# Patient Record
Sex: Female | Born: 1955 | Race: White | Hispanic: No | Marital: Married | Smoking: Current every day smoker
Health system: Southern US, Community
[De-identification: ages and names within clinical notes are randomized; demographics above are authoritative.]

## PROBLEM LIST (undated history)

## (undated) DIAGNOSIS — M26629 Arthralgia of temporomandibular joint, unspecified side: Secondary | ICD-10-CM

## (undated) DIAGNOSIS — F32A Depression, unspecified: Secondary | ICD-10-CM

## (undated) DIAGNOSIS — R519 Headache, unspecified: Secondary | ICD-10-CM

## (undated) DIAGNOSIS — M797 Fibromyalgia: Secondary | ICD-10-CM

## (undated) DIAGNOSIS — O039 Complete or unspecified spontaneous abortion without complication: Secondary | ICD-10-CM

## (undated) DIAGNOSIS — K469 Unspecified abdominal hernia without obstruction or gangrene: Secondary | ICD-10-CM

## (undated) DIAGNOSIS — Z9289 Personal history of other medical treatment: Secondary | ICD-10-CM

## (undated) DIAGNOSIS — E785 Hyperlipidemia, unspecified: Secondary | ICD-10-CM

## (undated) DIAGNOSIS — G629 Polyneuropathy, unspecified: Secondary | ICD-10-CM

## (undated) DIAGNOSIS — Z9889 Other specified postprocedural states: Secondary | ICD-10-CM

## (undated) DIAGNOSIS — G5600 Carpal tunnel syndrome, unspecified upper limb: Secondary | ICD-10-CM

## (undated) DIAGNOSIS — R112 Nausea with vomiting, unspecified: Secondary | ICD-10-CM

## (undated) DIAGNOSIS — G8929 Other chronic pain: Secondary | ICD-10-CM

## (undated) DIAGNOSIS — F329 Major depressive disorder, single episode, unspecified: Secondary | ICD-10-CM

## (undated) DIAGNOSIS — N189 Chronic kidney disease, unspecified: Secondary | ICD-10-CM

## (undated) DIAGNOSIS — T8859XA Other complications of anesthesia, initial encounter: Secondary | ICD-10-CM

## (undated) DIAGNOSIS — M1990 Unspecified osteoarthritis, unspecified site: Secondary | ICD-10-CM

## (undated) DIAGNOSIS — R51 Headache: Secondary | ICD-10-CM

## (undated) DIAGNOSIS — T4145XA Adverse effect of unspecified anesthetic, initial encounter: Secondary | ICD-10-CM

## (undated) DIAGNOSIS — M14671 Charcot's joint, right ankle and foot: Secondary | ICD-10-CM

## (undated) DIAGNOSIS — E1140 Type 2 diabetes mellitus with diabetic neuropathy, unspecified: Secondary | ICD-10-CM

## (undated) DIAGNOSIS — K219 Gastro-esophageal reflux disease without esophagitis: Secondary | ICD-10-CM

## (undated) DIAGNOSIS — I10 Essential (primary) hypertension: Secondary | ICD-10-CM

## (undated) HISTORY — PX: TMJ ARTHROPLASTY: SHX1066

## (undated) HISTORY — PX: BUNIONECTOMY: SHX129

## (undated) HISTORY — DX: Depression, unspecified: F32.A

## (undated) HISTORY — PX: COLONOSCOPY: SHX174

## (undated) HISTORY — DX: Other chronic pain: G89.29

## (undated) HISTORY — PX: ABDOMINAL SURGERY: SHX537

## (undated) HISTORY — PX: TONSILLECTOMY: SUR1361

## (undated) HISTORY — PX: OTHER SURGICAL HISTORY: SHX169

## (undated) HISTORY — DX: Arthralgia of temporomandibular joint, unspecified side: M26.629

## (undated) HISTORY — DX: Gastro-esophageal reflux disease without esophagitis: K21.9

## (undated) HISTORY — DX: Major depressive disorder, single episode, unspecified: F32.9

## (undated) HISTORY — DX: Hyperlipidemia, unspecified: E78.5

## (undated) HISTORY — DX: Complete or unspecified spontaneous abortion without complication: O03.9

## (undated) HISTORY — DX: Carpal tunnel syndrome, unspecified upper limb: G56.00

---

## 1981-11-15 HISTORY — PX: TUBAL LIGATION: SHX77

## 1981-11-15 HISTORY — PX: PARTIAL HYSTERECTOMY: SHX80

## 1998-06-30 ENCOUNTER — Ambulatory Visit (HOSPITAL_COMMUNITY): Admission: RE | Admit: 1998-06-30 | Discharge: 1998-06-30 | Payer: Self-pay | Admitting: Obstetrics and Gynecology

## 1998-06-30 ENCOUNTER — Encounter: Payer: Self-pay | Admitting: Obstetrics and Gynecology

## 1998-07-01 ENCOUNTER — Ambulatory Visit (HOSPITAL_COMMUNITY): Admission: RE | Admit: 1998-07-01 | Discharge: 1998-07-01 | Payer: Self-pay | Admitting: Obstetrics and Gynecology

## 1998-07-01 ENCOUNTER — Encounter: Payer: Self-pay | Admitting: Obstetrics and Gynecology

## 1999-06-17 ENCOUNTER — Ambulatory Visit (HOSPITAL_COMMUNITY): Admission: RE | Admit: 1999-06-17 | Discharge: 1999-06-17 | Payer: Self-pay | Admitting: Obstetrics and Gynecology

## 1999-06-17 ENCOUNTER — Encounter: Payer: Self-pay | Admitting: Obstetrics and Gynecology

## 1999-11-15 ENCOUNTER — Ambulatory Visit (HOSPITAL_COMMUNITY): Admission: RE | Admit: 1999-11-15 | Discharge: 1999-11-15 | Payer: Self-pay | Admitting: Oral Surgery

## 1999-11-15 ENCOUNTER — Encounter: Payer: Self-pay | Admitting: Oral Surgery

## 2000-02-18 ENCOUNTER — Encounter: Payer: Self-pay | Admitting: Oral Surgery

## 2000-02-23 ENCOUNTER — Inpatient Hospital Stay (HOSPITAL_COMMUNITY): Admission: RE | Admit: 2000-02-23 | Discharge: 2000-02-24 | Payer: Self-pay | Admitting: Oral Surgery

## 2000-07-19 ENCOUNTER — Ambulatory Visit (HOSPITAL_COMMUNITY): Admission: RE | Admit: 2000-07-19 | Discharge: 2000-07-19 | Payer: Self-pay | Admitting: Obstetrics and Gynecology

## 2000-07-19 ENCOUNTER — Encounter: Payer: Self-pay | Admitting: Obstetrics and Gynecology

## 2000-11-15 HISTORY — PX: VAGINAL HYSTERECTOMY: SUR661

## 2001-03-28 ENCOUNTER — Ambulatory Visit (HOSPITAL_BASED_OUTPATIENT_CLINIC_OR_DEPARTMENT_OTHER): Admission: RE | Admit: 2001-03-28 | Discharge: 2001-03-28 | Payer: Self-pay | Admitting: Orthopedic Surgery

## 2001-04-17 ENCOUNTER — Encounter: Payer: Self-pay | Admitting: General Surgery

## 2001-04-19 ENCOUNTER — Ambulatory Visit (HOSPITAL_COMMUNITY): Admission: RE | Admit: 2001-04-19 | Discharge: 2001-04-20 | Payer: Self-pay | Admitting: General Surgery

## 2001-05-26 ENCOUNTER — Ambulatory Visit (HOSPITAL_BASED_OUTPATIENT_CLINIC_OR_DEPARTMENT_OTHER): Admission: RE | Admit: 2001-05-26 | Discharge: 2001-05-26 | Payer: Self-pay | Admitting: Orthopedic Surgery

## 2001-09-05 ENCOUNTER — Encounter: Payer: Self-pay | Admitting: Obstetrics and Gynecology

## 2001-09-05 ENCOUNTER — Ambulatory Visit (HOSPITAL_COMMUNITY): Admission: RE | Admit: 2001-09-05 | Discharge: 2001-09-05 | Payer: Self-pay | Admitting: Obstetrics and Gynecology

## 2001-09-28 ENCOUNTER — Encounter: Payer: Self-pay | Admitting: Obstetrics and Gynecology

## 2001-10-04 ENCOUNTER — Inpatient Hospital Stay (HOSPITAL_COMMUNITY): Admission: RE | Admit: 2001-10-04 | Discharge: 2001-10-06 | Payer: Self-pay | Admitting: Obstetrics and Gynecology

## 2002-05-14 ENCOUNTER — Ambulatory Visit (HOSPITAL_COMMUNITY): Admission: RE | Admit: 2002-05-14 | Discharge: 2002-05-14 | Payer: Self-pay | Admitting: Orthopedic Surgery

## 2002-05-14 ENCOUNTER — Encounter: Payer: Self-pay | Admitting: Orthopedic Surgery

## 2002-09-06 ENCOUNTER — Encounter: Payer: Self-pay | Admitting: Obstetrics and Gynecology

## 2002-09-06 ENCOUNTER — Ambulatory Visit (HOSPITAL_COMMUNITY): Admission: RE | Admit: 2002-09-06 | Discharge: 2002-09-06 | Payer: Self-pay | Admitting: Obstetrics and Gynecology

## 2002-11-06 ENCOUNTER — Ambulatory Visit (HOSPITAL_BASED_OUTPATIENT_CLINIC_OR_DEPARTMENT_OTHER): Admission: RE | Admit: 2002-11-06 | Discharge: 2002-11-06 | Payer: Self-pay | Admitting: Orthopedic Surgery

## 2003-11-11 ENCOUNTER — Ambulatory Visit (HOSPITAL_COMMUNITY): Admission: RE | Admit: 2003-11-11 | Discharge: 2003-11-11 | Payer: Self-pay | Admitting: Obstetrics and Gynecology

## 2004-05-08 ENCOUNTER — Encounter: Admission: RE | Admit: 2004-05-08 | Discharge: 2004-05-08 | Payer: Self-pay | Admitting: General Surgery

## 2004-05-14 ENCOUNTER — Encounter: Admission: RE | Admit: 2004-05-14 | Discharge: 2004-05-14 | Payer: Self-pay | Admitting: General Surgery

## 2004-08-14 ENCOUNTER — Ambulatory Visit (HOSPITAL_COMMUNITY): Admission: RE | Admit: 2004-08-14 | Discharge: 2004-08-14 | Payer: Self-pay | Admitting: Gastroenterology

## 2004-10-23 ENCOUNTER — Ambulatory Visit: Payer: Self-pay | Admitting: Cardiology

## 2005-07-30 ENCOUNTER — Encounter: Admission: RE | Admit: 2005-07-30 | Discharge: 2005-07-30 | Payer: Self-pay | Admitting: General Surgery

## 2005-09-21 ENCOUNTER — Inpatient Hospital Stay (HOSPITAL_COMMUNITY): Admission: RE | Admit: 2005-09-21 | Discharge: 2005-09-23 | Payer: Self-pay | Admitting: General Surgery

## 2010-12-06 ENCOUNTER — Encounter: Payer: Self-pay | Admitting: Obstetrics and Gynecology

## 2012-06-20 ENCOUNTER — Emergency Department (HOSPITAL_COMMUNITY): Payer: BC Managed Care – PPO

## 2012-06-20 ENCOUNTER — Encounter (HOSPITAL_COMMUNITY): Payer: Self-pay | Admitting: *Deleted

## 2012-06-20 ENCOUNTER — Emergency Department (HOSPITAL_COMMUNITY)
Admission: EM | Admit: 2012-06-20 | Discharge: 2012-06-21 | Disposition: A | Discharge status: Home / Self Care | Payer: BC Managed Care – PPO | Attending: Emergency Medicine | Admitting: Emergency Medicine

## 2012-06-20 DIAGNOSIS — IMO0001 Reserved for inherently not codable concepts without codable children: Secondary | ICD-10-CM | POA: Insufficient documentation

## 2012-06-20 DIAGNOSIS — S0003XA Contusion of scalp, initial encounter: Secondary | ICD-10-CM | POA: Insufficient documentation

## 2012-06-20 DIAGNOSIS — E119 Type 2 diabetes mellitus without complications: Secondary | ICD-10-CM | POA: Insufficient documentation

## 2012-06-20 DIAGNOSIS — Y92009 Unspecified place in unspecified non-institutional (private) residence as the place of occurrence of the external cause: Secondary | ICD-10-CM | POA: Insufficient documentation

## 2012-06-20 DIAGNOSIS — I10 Essential (primary) hypertension: Secondary | ICD-10-CM | POA: Insufficient documentation

## 2012-06-20 DIAGNOSIS — W19XXXA Unspecified fall, initial encounter: Secondary | ICD-10-CM | POA: Insufficient documentation

## 2012-06-20 DIAGNOSIS — T50905A Adverse effect of unspecified drugs, medicaments and biological substances, initial encounter: Secondary | ICD-10-CM

## 2012-06-20 DIAGNOSIS — E669 Obesity, unspecified: Secondary | ICD-10-CM | POA: Insufficient documentation

## 2012-06-20 DIAGNOSIS — Z79899 Other long term (current) drug therapy: Secondary | ICD-10-CM | POA: Insufficient documentation

## 2012-06-20 DIAGNOSIS — W1800XA Striking against unspecified object with subsequent fall, initial encounter: Secondary | ICD-10-CM

## 2012-06-20 HISTORY — DX: Essential (primary) hypertension: I10

## 2012-06-20 HISTORY — DX: Fibromyalgia: M79.7

## 2012-06-20 HISTORY — DX: Polyneuropathy, unspecified: G62.9

## 2012-06-20 HISTORY — DX: Unspecified abdominal hernia without obstruction or gangrene: K46.9

## 2012-06-20 LAB — POCT I-STAT, CHEM 8
BUN: 18 mg/dL (ref 6–23)
Calcium, Ion: 1.23 mmol/L (ref 1.12–1.23)
Chloride: 101 mEq/L (ref 96–112)
Creatinine, Ser: 0.8 mg/dL (ref 0.50–1.10)
Glucose, Bld: 107 mg/dL — ABNORMAL HIGH (ref 70–99)
HCT: 36 % (ref 36.0–46.0)
Hemoglobin: 12.2 g/dL (ref 12.0–15.0)
Potassium: 4.4 mEq/L (ref 3.5–5.1)
Sodium: 140 mEq/L (ref 135–145)
TCO2: 30 mmol/L (ref 0–100)

## 2012-06-20 LAB — CBC
HCT: 35.2 % — ABNORMAL LOW (ref 36.0–46.0)
Hemoglobin: 11.1 g/dL — ABNORMAL LOW (ref 12.0–15.0)
MCH: 30.9 pg (ref 26.0–34.0)
MCHC: 31.5 g/dL (ref 30.0–36.0)
MCV: 98.1 fL (ref 78.0–100.0)
Platelets: 206 10*3/uL (ref 150–400)
RBC: 3.59 MIL/uL — ABNORMAL LOW (ref 3.87–5.11)
RDW: 12.9 % (ref 11.5–15.5)
WBC: 7.7 10*3/uL (ref 4.0–10.5)

## 2012-06-20 LAB — GLUCOSE, CAPILLARY: Glucose-Capillary: 88 mg/dL (ref 70–99)

## 2012-06-20 MED ORDER — SODIUM CHLORIDE 0.9 % IV SOLN
Freq: Once | INTRAVENOUS | Status: AC
  Administered 2012-06-21: via INTRAVENOUS

## 2012-06-20 NOTE — ED Notes (Signed)
Pt states she ran into a wall and fell early this morning approx 2 am, c/o right facial pain and right knee pain.

## 2012-06-20 NOTE — ED Notes (Signed)
Pt in with family. Pt states she got up in the middle of the night and got disoriented and walked into a wall. Family thinks she must have tripped and hit face into wall. No loc. Pt has extensive bruising and swelling to right and and right cheek area. Pupils PERLA. Pt states some blurred vision when she is able to open her eye. Also some light sensitivity. Pt family also reports that ever since she started taking Lyrica  Pt is very drowsy. Pt is A&ox3

## 2012-06-21 LAB — URINE MICROSCOPIC-ADD ON

## 2012-06-21 LAB — URINALYSIS, ROUTINE W REFLEX MICROSCOPIC
Bilirubin Urine: NEGATIVE
Glucose, UA: NEGATIVE mg/dL
Ketones, ur: NEGATIVE mg/dL
Leukocytes, UA: NEGATIVE
Nitrite: NEGATIVE
Protein, ur: NEGATIVE mg/dL
Specific Gravity, Urine: 1.021 (ref 1.005–1.030)
Urobilinogen, UA: 0.2 mg/dL (ref 0.0–1.0)
pH: 5.5 (ref 5.0–8.0)

## 2012-06-21 LAB — RAPID URINE DRUG SCREEN, HOSP PERFORMED
Amphetamines: NOT DETECTED
Barbiturates: NOT DETECTED
Benzodiazepines: POSITIVE — AB
Cocaine: NOT DETECTED
Opiates: POSITIVE — AB
Tetrahydrocannabinol: NOT DETECTED

## 2012-06-21 MED ORDER — CEPHALEXIN 250 MG PO CAPS
250.0000 mg | ORAL_CAPSULE | Freq: Once | ORAL | Status: AC
  Administered 2012-06-21: 250 mg via ORAL
  Filled 2012-06-21: qty 1

## 2012-06-21 MED ORDER — CEPHALEXIN 250 MG PO CAPS: 250.0000 mg | ORAL_CAPSULE | Freq: Four times a day (QID) | ORAL | Status: AC

## 2012-06-21 NOTE — ED Provider Notes (Signed)
History     CSN: [id number]  Arrival date & time 06/20/12  1933   First MD Initiated Contact with Patient 06/20/12 2217      Chief Complaint  Patient presents with  . Fall  . Eye Injury    (Consider location/radiation/quality/duration/timing/severity/associated sxs/prior treatment) HPI Comments: Obese patient, who is very somnolent, stating, that she uses lots of medications for her chronic pain issues.  Over the last night or early this morning.  She fell in her home.  The patient states she hit her face against a wall without loss of consciousness.  She has significant bruising to the right side of her face.  Under her chin.  She also has an abrasion to the anterior portion of her right shin and what appears to be a cellulitic blush to the anterior medial portion of her left shin  The history is provided by the patient and the spouse.    Past Medical History  Diagnosis Date  . Diabetes mellitus   . Hypertension   . Fibromyalgia   . Peripheral neuropathy   . Hernia     Past Surgical History  Procedure Date  . Tonsillectomy   . Abdominal surgery     History reviewed. No pertinent family history.  History  Substance Use Topics  . Smoking status: Never Smoker   . Smokeless tobacco: Not on file  . Alcohol Use: No    OB History    Grav Para Term Preterm Abortions TAB SAB Ect Mult Living                  Review of Systems  Constitutional: Negative for fever and chills.  HENT: Negative for neck pain and neck stiffness.   Eyes: Negative for photophobia, pain, redness and visual disturbance.  Gastrointestinal: Negative for nausea and vomiting.  Musculoskeletal: Negative for joint swelling.  Skin: Positive for wound.  Neurological: Positive for headaches. Negative for dizziness, weakness and numbness.    Allergies  Review of patient's allergies indicates no known allergies.  Home Medications   Current Outpatient Rx  Name Route Sig Dispense Refill  .  ALPRAZOLAM 0.5 MG PO TABS Oral Take 0.5 mg by mouth 2 (two) times daily.    Marland Kitchen DICLOFENAC-MISOPROSTOL 75-0.2 MG PO TBEC Oral Take 1 tablet by mouth 2 (two) times daily.    . ECONAZOLE NITRATE 1 % EX CREA Topical Apply 1 application topically 2 (two) times daily. Apply to both feet    . METFORMIN HCL ER (OSM) 500 MG PO TB24 Oral Take 500 mg by mouth daily with breakfast.    . MORPHINE SULFATE ER BEADS 120 MG PO CP24 Oral Take 120 mg by mouth 2 (two) times daily.    . NEBIVOLOL HCL 5 MG PO TABS Oral Take 5 mg by mouth daily.    Marland Kitchen RANITIDINE HCL 150 MG PO TABS Oral Take 300 mg by mouth at bedtime.    . SUMATRIPTAN SUCCINATE 100 MG PO TABS Oral Take 100 mg by mouth daily as needed. For migraines not relieved by Topamax    . TOPIRAMATE 100 MG PO TABS Oral Take 100 mg by mouth 2 (two) times daily.    . VENLAFAXINE HCL ER 150 MG PO CP24 Oral Take 150 mg by mouth daily.    . CEPHALEXIN 250 MG PO CAPS Oral Take 1 capsule (250 mg total) by mouth 4 (four) times daily. 28 capsule 0    BP 110/61  Pulse 84  Temp 98.4 F (36.9  C) (Oral)  Resp 18  SpO2 97%  Physical Exam  Constitutional: She appears well-developed and well-nourished.  HENT:  Head: Normocephalic.    Eyes: Pupils are equal, round, and reactive to light.  Neck: Normal range of motion.  Cardiovascular: Normal rate.   Pulmonary/Chest: Effort normal.  Musculoskeletal: Normal range of motion.  Neurological: She is alert.  Skin: Skin is warm.       ED Course  Procedures (including critical care time)  Labs Reviewed  CBC - Abnormal; Notable for the following:    RBC 3.59 (*)     Hemoglobin 11.1 (*)     HCT 35.2 (*)     All other components within normal limits  URINE RAPID DRUG SCREEN (HOSP PERFORMED) - Abnormal; Notable for the following:    Opiates POSITIVE (*)     Benzodiazepines POSITIVE (*)     All other components within normal limits  POCT I-STAT, CHEM 8 - Abnormal; Notable for the following:    Glucose, Bld 107 (*)      All other components within normal limits  URINALYSIS, ROUTINE W REFLEX MICROSCOPIC - Abnormal; Notable for the following:    Hgb urine dipstick MODERATE (*)     All other components within normal limits  GLUCOSE, CAPILLARY  URINE MICROSCOPIC-ADD ON   Ct Head Wo Contrast  06/20/2012  *RADIOLOGY REPORT*  Clinical Data:  The patient walked into a wall.  CT HEAD WITHOUT CONTRAST CT MAXILLOFACIAL WITHOUT CONTRAST CT CERVICAL SPINE WITHOUT CONTRAST  Technique:  Multidetector CT imaging of the head, cervical spine, and maxillofacial structures were performed using the standard protocol without intravenous contrast. Multiplanar CT image reconstructions of the cervical spine and maxillofacial structures were also generated.  Comparison:  No comparison studies available.  CT HEAD  Findings: The ventricles and sulci are symmetrical without significant effacement, displacement, or dilatation. No mass effect or midline shift. No abnormal extra-axial fluid collections. The grey-white matter junction is distinct. Basal cisterns are not effaced. No acute intracranial hemorrhage. No depressed skull fractures.  Mastoid air cells are not opacified.  IMPRESSION: No acute intracranial abnormalities.  CT MAXILLOFACIAL  Findings:  Right periorbital and infraorbital soft tissue hematomas.  No retrobulbar involvement.  The globes and extraocular muscles appear intact and symmetrical.  The orbital rims, maxillary antral walls, nasal bones, nasal septum, nasal spine, pterygoid plates, zygomatic arches, temporomandibular joints, and mandibles appear intact.  No displaced fractures are identified.  The paranasal sinuses are clear.  No acute air-fluid levels noted.  No focal bone lesion or bone destruction.  Small osseous fragment in the left temporomandibular joint likely represents degenerative change.  IMPRESSION: Right periorbital and infraorbital soft tissue hematoma.  No displaced orbital or facial fractures identified.  CT  CERVICAL SPINE  Findings:   Significant technical limitation of the study due to motion artifact.  As visualized, the cervical vertebrae and facet joints appear demonstrate normal alignment.  No vertebral compression deformities.  No prevertebral soft tissue swelling.  No evidence of focal bone lesion or bone destruction.  Lateral masses of C1 appear symmetrical.  The odontoid process appears intact.  IMPRESSION:  Technically limited study due to motion artifact resulting in limited visualization of the cervical vertebrae.  No obvious displaced fractures are identified.  Normal alignment is suggested.  Original Report Authenticated By: Marlon Pel, M.D.   Ct Cervical Spine Wo Contrast  06/20/2012  *RADIOLOGY REPORT*  Clinical Data:  The patient walked into a wall.  CT HEAD WITHOUT CONTRAST  CT MAXILLOFACIAL WITHOUT CONTRAST CT CERVICAL SPINE WITHOUT CONTRAST  Technique:  Multidetector CT imaging of the head, cervical spine, and maxillofacial structures were performed using the standard protocol without intravenous contrast. Multiplanar CT image reconstructions of the cervical spine and maxillofacial structures were also generated.  Comparison:  No comparison studies available.  CT HEAD  Findings: The ventricles and sulci are symmetrical without significant effacement, displacement, or dilatation. No mass effect or midline shift. No abnormal extra-axial fluid collections. The grey-white matter junction is distinct. Basal cisterns are not effaced. No acute intracranial hemorrhage. No depressed skull fractures.  Mastoid air cells are not opacified.  IMPRESSION: No acute intracranial abnormalities.  CT MAXILLOFACIAL  Findings:  Right periorbital and infraorbital soft tissue hematomas.  No retrobulbar involvement.  The globes and extraocular muscles appear intact and symmetrical.  The orbital rims, maxillary antral walls, nasal bones, nasal septum, nasal spine, pterygoid plates, zygomatic arches,  temporomandibular joints, and mandibles appear intact.  No displaced fractures are identified.  The paranasal sinuses are clear.  No acute air-fluid levels noted.  No focal bone lesion or bone destruction.  Small osseous fragment in the left temporomandibular joint likely represents degenerative change.  IMPRESSION: Right periorbital and infraorbital soft tissue hematoma.  No displaced orbital or facial fractures identified.  CT CERVICAL SPINE  Findings:   Significant technical limitation of the study due to motion artifact.  As visualized, the cervical vertebrae and facet joints appear demonstrate normal alignment.  No vertebral compression deformities.  No prevertebral soft tissue swelling.  No evidence of focal bone lesion or bone destruction.  Lateral masses of C1 appear symmetrical.  The odontoid process appears intact.  IMPRESSION:  Technically limited study due to motion artifact resulting in limited visualization of the cervical vertebrae.  No obvious displaced fractures are identified.  Normal alignment is suggested.  Original Report Authenticated By: Marlon Pel, M.D.   Ct Maxillofacial Wo Cm  06/20/2012  *RADIOLOGY REPORT*  Clinical Data:  The patient walked into a wall.  CT HEAD WITHOUT CONTRAST CT MAXILLOFACIAL WITHOUT CONTRAST CT CERVICAL SPINE WITHOUT CONTRAST  Technique:  Multidetector CT imaging of the head, cervical spine, and maxillofacial structures were performed using the standard protocol without intravenous contrast. Multiplanar CT image reconstructions of the cervical spine and maxillofacial structures were also generated.  Comparison:  No comparison studies available.  CT HEAD  Findings: The ventricles and sulci are symmetrical without significant effacement, displacement, or dilatation. No mass effect or midline shift. No abnormal extra-axial fluid collections. The grey-white matter junction is distinct. Basal cisterns are not effaced. No acute intracranial hemorrhage. No  depressed skull fractures.  Mastoid air cells are not opacified.  IMPRESSION: No acute intracranial abnormalities.  CT MAXILLOFACIAL  Findings:  Right periorbital and infraorbital soft tissue hematomas.  No retrobulbar involvement.  The globes and extraocular muscles appear intact and symmetrical.  The orbital rims, maxillary antral walls, nasal bones, nasal septum, nasal spine, pterygoid plates, zygomatic arches, temporomandibular joints, and mandibles appear intact.  No displaced fractures are identified.  The paranasal sinuses are clear.  No acute air-fluid levels noted.  No focal bone lesion or bone destruction.  Small osseous fragment in the left temporomandibular joint likely represents degenerative change.  IMPRESSION: Right periorbital and infraorbital soft tissue hematoma.  No displaced orbital or facial fractures identified.  CT CERVICAL SPINE  Findings:   Significant technical limitation of the study due to motion artifact.  As visualized, the cervical vertebrae and facet joints appear demonstrate normal  alignment.  No vertebral compression deformities.  No prevertebral soft tissue swelling.  No evidence of focal bone lesion or bone destruction.  Lateral masses of C1 appear symmetrical.  The odontoid process appears intact.  IMPRESSION:  Technically limited study due to motion artifact resulting in limited visualization of the cervical vertebrae.  No obvious displaced fractures are identified.  Normal alignment is suggested.  Original Report Authenticated By: Marlon Pel, M.D.     1. Fall against object   2. Medication adverse effect       MDM   I reviewed the CT scans with the patient and her husband as well.  As discussed her medication usage.  I recommended that she stopped several of her medications make an appointment with her primary care physician for referral to a chronic pain clinic        Arman Filter, NP 06/21/12 1610  Arman Filter, NP 06/21/12 [phone]

## 2012-06-22 NOTE — ED Provider Notes (Signed)
Medical screening examination/treatment/procedure(s) were performed by non-physician practitioner and as supervising physician I was immediately available for consultation/collaboration.  Flint Melter, MD 06/22/12 2113

## 2012-08-16 ENCOUNTER — Other Ambulatory Visit: Payer: Self-pay | Admitting: Cardiology

## 2012-08-16 DIAGNOSIS — I739 Peripheral vascular disease, unspecified: Secondary | ICD-10-CM

## 2012-08-21 ENCOUNTER — Encounter (INDEPENDENT_AMBULATORY_CARE_PROVIDER_SITE_OTHER): Payer: BC Managed Care – PPO

## 2012-08-21 DIAGNOSIS — I739 Peripheral vascular disease, unspecified: Secondary | ICD-10-CM

## 2012-08-21 DIAGNOSIS — M79609 Pain in unspecified limb: Secondary | ICD-10-CM

## 2014-10-14 ENCOUNTER — Encounter: Payer: Self-pay | Admitting: *Deleted

## 2014-10-15 ENCOUNTER — Ambulatory Visit: Payer: Medicare Other | Admitting: Neurology

## 2014-10-15 ENCOUNTER — Encounter: Payer: Self-pay | Admitting: Neurology

## 2014-10-15 ENCOUNTER — Telehealth: Payer: Self-pay | Admitting: Neurology

## 2014-10-15 NOTE — Telephone Encounter (Signed)
Pt called to r/s her NP appt w/ Dr. Allena KatzPatel on 10/15/14. Pt r/s to 12/06/14 Dr. Esmond PlantsButler/referring provider was notified.

## 2014-10-17 NOTE — Telephone Encounter (Signed)
See previous documentation. Appt marked as a no show b/c the patient did not provide 24hrs prior notification but a no show letter will not be sent to the patient / Sherri S.

## 2014-12-06 ENCOUNTER — Ambulatory Visit: Payer: Medicare Other | Admitting: Neurology

## 2014-12-10 ENCOUNTER — Encounter: Payer: Self-pay | Admitting: Neurology

## 2014-12-10 ENCOUNTER — Ambulatory Visit (INDEPENDENT_AMBULATORY_CARE_PROVIDER_SITE_OTHER): Payer: BLUE CROSS/BLUE SHIELD | Admitting: Neurology

## 2014-12-10 ENCOUNTER — Ambulatory Visit: Payer: Medicare Other | Admitting: Neurology

## 2014-12-10 ENCOUNTER — Telehealth: Payer: Self-pay | Admitting: *Deleted

## 2014-12-10 ENCOUNTER — Encounter: Payer: Self-pay | Admitting: *Deleted

## 2014-12-10 VITALS — BP 160/90 | HR 80 | Ht 63.0 in | Wt 240.6 lb

## 2014-12-10 DIAGNOSIS — IMO0001 Reserved for inherently not codable concepts without codable children: Secondary | ICD-10-CM

## 2014-12-10 DIAGNOSIS — E1140 Type 2 diabetes mellitus with diabetic neuropathy, unspecified: Secondary | ICD-10-CM | POA: Insufficient documentation

## 2014-12-10 DIAGNOSIS — Z79899 Other long term (current) drug therapy: Secondary | ICD-10-CM

## 2014-12-10 DIAGNOSIS — R030 Elevated blood-pressure reading, without diagnosis of hypertension: Secondary | ICD-10-CM

## 2014-12-10 DIAGNOSIS — E0842 Diabetes mellitus due to underlying condition with diabetic polyneuropathy: Secondary | ICD-10-CM

## 2014-12-10 LAB — TSH: TSH: 7.684 u[IU]/mL — AB (ref 0.350–4.500)

## 2014-12-10 LAB — VITAMIN B12: Vitamin B-12: 348 pg/mL (ref 211–911)

## 2014-12-10 LAB — SEDIMENTATION RATE: Sed Rate: 45 mm/hr — ABNORMAL HIGH (ref 0–22)

## 2014-12-10 MED ORDER — LIDOCAINE 5 % EX OINT: TOPICAL_OINTMENT | CUTANEOUS | Status: DC

## 2014-12-10 NOTE — Telephone Encounter (Signed)
Second BP check 160/90.  BP put into extended vitals.

## 2014-12-10 NOTE — Progress Notes (Signed)
[facility] Clinic Note - Initial Visit   Date: 12/10/2014:   Erin [name] MRN: [id number] DOB: October 02, 1956   Dear Dr. Melina Copa:  Thank you for your kind referral of Erin [name] for consultation of numbness/tingling of the feet. Although her history is well known to you, please allow [place] to reiterate it for the purpose of our medical record. The patient was accompanied to the clinic by self.    History of Present Illness: Erin [name] is a 59 y.o. right-handed Caucasian female with GERD, fibromyalgia, hypertension, depression, migraine, diabetes mellitus type 2, vitamin B12 deficiency, chronic pain syndrome presenting for evaluation of numbness/tingling of the feet.  Starting around 2013, she developed sudden onset of numbness/tingling involving her right foot and within three months, she developed similar symptoms on the right.  She also complains of burning sensation and intermittent stabbing pain over the arch of the feet.  Symptoms are constant and worse at night and light pressure.  Nothing that alleviates her pain.  Discomfort is now at the level of mid-calf and involves her ankles and feet.  She was started on Lyrica initially and is currently on $RemoveBefo'100mg'cdbKmnZVTiz$  TID which seems to alleviate some of the pain.   She takes oxycodone $RemoveBeforeD'30mg'wgHKDLWofmzkhF$  every 4-5 hours.  In the fall of 2015, she developed intermittent numbness/tingling of the hands which is worse in the morning.    She walks with a cane for balance.  No recent falls.    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    She also seen podiatry and a chiropractor.  She was told her spine was totally out of alignment and received laser therapy to her feet for three weeks, but felt even worse so stopped going.   She has tried heating pads, ice, gabapentin (cogntive side effects) and Lyrica $RemoveBe'100mg'jSfFvUCkf$  TID.  Out-side paper records, electronic medical record, and images have been reviewed where  available and summarized as:  HbA1c 06/18/2013:  5.5  Past Medical History  Diagnosis Date  . Diabetes mellitus   . Hypertension   . Fibromyalgia   . Peripheral neuropathy   . Hernia   . Depression   . GERD (gastroesophageal reflux disease)   . Chronic pain   . Miscarriage     twins  . Carpal tunnel syndrome   . TMJ syndrome     Past Surgical History  Procedure Laterality Date  . Tonsillectomy    . Abdominal surgery       Medications:  Current Outpatient Prescriptions on File Prior to Visit  Medication Sig Dispense Refill  . metformin (FORTAMET) 500 MG (OSM) 24 hr tablet Take 500 mg by mouth daily with breakfast.     No current facility-administered medications on file prior to visit.    Allergies: No Known Allergies  Family History: Family History  Problem Relation Age of Onset  . Aneurysm Father     Deceased, 62  . CAD Father   . Heart disease Father   . Cancer Father   . Pancreatic cancer Mother     Deceased, 66    Social History: History   Social History  . Marital Status: Married    Spouse Name: N/A    Number of Children: N/A  . Years of Education: N/A   Occupational History  . Not on file.   Social History Main Topics  . Smoking status: Former Research scientist (life sciences)  . Smokeless tobacco: Not on file  . Alcohol Use:  No  . Drug Use: No  . Sexual Activity: Not on file   Other Topics Concern  . Not on file   Social History Narrative   Lives with husband in a 2 story home.  Has 3 grown daughters and 3 granddaughters.  On disability for many different reasons.    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      Review of Systems:  CONSTITUTIONAL: No fevers, chills, night sweats, or weight loss.  + weight gain EYES: No visual changes or eye pain ENT: No hearing changes.  No history of nose bleeds.   RESPIRATORY: No cough, wheezing and shortness of breath.   CARDIOVASCULAR: Negative for chest  pain, and palpitations.   GI: Negative for abdominal discomfort, blood in stools or black stools.  No recent change in bowel habits.   GU:  No history of incontinence.   MUSCLOSKELETAL: No history of joint pain or swelling.  No myalgias.   SKIN: Negative for lesions, rash, and itching.   HEMATOLOGY/ONCOLOGY: Negative for prolonged bleeding, bruising easily, and swollen nodes.  No history of cancer.   ENDOCRINE: Negative for cold or heat intolerance, polydipsia or goiter.   PSYCH:  No depression or anxiety symptoms.   NEURO: As Above.   Vital Signs:  BP 160/90 mmHg  Pulse 80  Ht _0  (1.6 m)  Wt 240 lb 9 oz (109.118 kg)  BMI 42.62 kg/m2  SpO2 94%   General Medical Exam:   General:  Obese, well appearing, comfortable.   Eyes/ENT: see cranial nerve examination.   Neck: No masses appreciated.  Full range of motion without tenderness.  No carotid bruits. Respiratory:  Clear to auscultation, good air entry bilaterally.   Cardiac:  Regular rate and rhythm, no murmur.   Extremities:  No deformities, edema, or skin discoloration.  Skin:  No rashes or lesions.  Neurological Exam: MENTAL STATUS including orientation to time, place, person, recent and remote memory, attention span and concentration, language, and fund of knowledge is normal.  Speech is not dysarthric.  CRANIAL NERVES: II:  No visual field defects.  Unremarkable fundi.   III-IV-VI: Pupils equal round and reactive to light.  Normal conjugate, extra-ocular eye movements in all directions of gaze.  No nystagmus.  Left ptosis (old).   V:  Normal facial sensation.  VII:  Left facial asymmetry with left ptosis (old) and nasolabial flattening (previous TMJ surgery x 2).  No pathologic facial reflexes.  VIII:  Normal hearing and vestibular function.   IX-X:  Normal palatal movement.   XI:  Normal shoulder shrug and head rotation.   XII:  Normal tongue strength and range of motion, no deviation or fasciculation.  MOTOR:  No  atrophy, fasciculations or abnormal movements.  No pronator drift.  Tone is normal.    Right Upper Extremity:    Left Upper Extremity:    Deltoid  5/5   Deltoid  5/5   Biceps  5/5   Biceps  5/5   Triceps  5/5   Triceps  5/5   Wrist extensors  5/5   Wrist extensors  5/5   Wrist flexors  5/5   Wrist flexors  5/5   Finger extensors  5/5   Finger extensors  5/5   Finger flexors  5/5   Finger flexors  5/5   Dorsal interossei  5/5   Dorsal interossei  5/5   Abductor pollicis  5/5  Abductor pollicis  5/5   Tone (Ashworth scale)  0  Tone (Ashworth scale)  0   Right Lower Extremity:    Left Lower Extremity:    Hip flexors  5/5   Hip flexors  5/5   Hip extensors  5/5   Hip extensors  5/5   Knee flexors  5/5   Knee flexors  5/5   Knee extensors  5/5   Knee extensors  5/5   Dorsiflexors  5/5   Dorsiflexors  5/5   Plantarflexors  5/5   Plantarflexors  5/5   Toe extensors  5/5   Toe extensors  5/5   Toe flexors  5/5   Toe flexors  5/5   Tone (Ashworth scale)  0  Tone (Ashworth scale)  0   MSRs:  Right                                                                 Left brachioradialis 2+  brachioradialis 2+  biceps 2+  biceps 2+  triceps 2+  triceps 2+  patellar 2+  patellar 2+  ankle jerk 0  ankle jerk 0  Hoffman no  Hoffman no  plantar response down  plantar response down   SENSORY:  Pin prick and temperature reduced distal to mid-calf bilaterally.  Vibration absent distal to ankles bilaterally.  Sensation all modalities intact in the arms.  Romberg's sign positive.   COORDINATION/GAIT: Normal finger-to- nose-finger. Intact rapid alternating movements bilaterally.  Able to rise from a chair without using arms.  Gait is wide-based due to body habitus, stable.  She is unsteady with tandem gait.    IMPRESSION: Erin [name] is a 59 year-old female presenting for evaluation of neuropathic pain affecting the feet. Her neurological examination shows a distal predominant peripheral  neuropathy. I had extensive discussion with the patient regarding the pathogenesis, etiology, management, and natural course of neuropathy. Neuropathy tends to be slowly progressive, especially underlying etiology is not adequately managed.  Although she has diabetes, her HbA1c from 2014 was 5.5 so it is difficult to attribute the severity of her symptoms to diabetes neuropathy.  For this reason, I would like to test for other treatable causes of neuropathy. From a symptomatic standpoint, pain is her biggest complaint.  She has only been on neurontin which she did not tolerate due to cognitive side effects and there is still room to go up on her Lyrica.  Going forward, consider adding amitriptyline, Cymbalta, venlafaxine, or alpha-lipoic acid.  I stressed that the goal is to reduce the amount of oxycodone she is taking.   PLAN/RECOMMENDATIONS:  1.  Increase Lyrica to 179m in the morning, 1049mafternoon, and 20015mt bedtime 2.  Start lidocaine ointment to feet twice daily 3.  Check ESR, vitamin B12, TSH, vitamin B6, SPEP/UPEP with IFE, copper, HbA1c 4.  Encouraged to stay active for weight loss 5.  Fall precautions and walking with a cane encouraged 6.  Consider gait training, if symptoms worsen 7.  Return to clinic in 3 months.   The duration of this appointment visit was 50 minutes of face-to-face time with the patient.  Greater than 50% of this time was spent in counseling, explanation of diagnosis, planning of further management, and coordination of care.  Thank you for allowing me to participate in patient's care.  If I can answer any additional questions, I would be pleased to do so.    Sincerely,    Rory Xiang K. Posey Pronto, DO

## 2014-12-10 NOTE — Patient Instructions (Addendum)
1.  Increase Lyrica to  in the morning,  afternoon, and  at bedtime. Try to cut back on the amount of oxycodone you are taking 2.  Start lidocaine ointment to feet twice daily.  Please be sure to use gloves when applying 3.  Check blood work 4.  Encouraged to stay active for weight loss 5.  Fall precautions and walking with a cane encouraged 6.  Call with an update in 69-month 7.  Monitor blood pressure at home and share with your PCP if it remains elevated 8.  Return to clinic in 3 months.   [facility]  Preventing Falls in the Home   Falls are common, often dreaded events in the lives of older people. Aside from the obvious injuries and even death that may result, falls can cause wide-ranging consequences including loss of independence, mental decline, decreased activity, and mobility. Younger people are also at risk of falling, especially those with chronic illnesses and fatigue.  Ways to reduce the risk for falling:  * Examine diet and medications. Warm foods and alcohol dilate blood vessels, which can lead to dizziness when standing. Sleep aids, antidepressants, and pain medications can also increase the likelihood of a fall.  * Get a vison exam. Poor vision, cataracts, and glaucoma increase the chances of falling.  * Check foot gear. Shoes should fit snugly and have a sturdy, nonskid sole and broad, low heel.  * Participate in a physician-approved exercise program to build and maintain muscle strength and improve balance and coordination.  * Increase vitamin D intake. Vitamin D improves muscle strength and increases the amount of calcium the body is able to absorb and deposit in bones.  How to prevent falls from common hazards:  * Floors - Remove all loose wires, cords, and throw rugs. Minimize clutter. Make sure rugs are anchored and smooth. Keep furniture in its usual place.  * Chairs - Use chairs with straight backs, armrests, and firm seats. Add firm cushions to  existing pieces to add height.  * Bathroom - Install grab bars and non-skid tape in the tub or shower. Use a bathtub transfer bench or a shower chair with a back support. Use an elevated toilet seat and/or safety rails to assist standing from a low surface. Do not use towel racks or bathroom tissue holders to help you stand.  * Lighting - Make sure halls, stairways, and entrances are well-lit. Install a night light in your bathroom or hallway. Make sure there is a light switch at the top and bottom of the staircase. Turn lights on if you get up in the middle of the night. Make sure lamps or light switches are within reach of the bed if you have to get up during the night.  * Kitchen - Install non-skid rubber mats near the sink and stove. Clean spills immediately. Store frequently used utensils, pots, and pans between waist and eye level. This helps prevent reaching and bending. Sit when getting things out of the lower cupboards.  * Living room / Bedrooms - Place furniture with wide spaces in between, giving enough room to move around. Establish a route through the living room that gives you something to hold onto as you walk.  * Stairs - Make sure treads, rails, and rugs are secure. Install a rail on both sides of the stairs. If stairs are a threat, it might be helpful to arrange most of your activities on the lower level to reduce the number of times you  must climb the stairs.  * Entrances and doorways - Install metal handles on the walls adjacent to the doorknobs of all doors to make it more secure as you travel through the doorway.  Tips for maintaining balance:  * Keep at least one hand free at all times Try using a backpack or fanny pack to hold things rather than carrying them in your hands. Never carry objects in both hands when walking as this interferes with keeping your balance.  * Attempt to swing both arms from front to back while walking. This might require a conscious effort if Parkinson's  disease has diminished your movement. It will, however, help you to maintain balance and posture, and reduce fatigue.  * Consciously lift your feet off the ground when walking. Shuffling and dragging of the feet is a common culprit in losing your balance.  * When trying to navigate turns, use a "U" technique of facing forward and making a wide turn, rather than pivoting sharply.  * Try to stand with your feet shoulder-length apart. When your feet are close together for any length of time, you increase your risk of losing your balance and falling.  * Do one thing at a time. Do not try to walk and accomplish another task, such as reading or looking around. The decrease in your automatic reflexes complicates motor function, so the less distraction, the better.  * Do not wear rubber or gripping soled shoes, they might "catch" on the floor and cause tripping.  * Move slowly when changing positions. Use deliberate, concentrated movements and, if needed, use a grab bar or walking aid. Count fifteen (15) seconds after standing to begin walking.  * If balance is a continuous problem, you might want to consider a walking aid such as a cane, walking stick, or walker. Once you have mastered walking with help, you may be ready to try it again on your own.  This information is provided by [facility] and is not intended to replace the medical advice of your physician or other health care providers. Please consult your physician or other health care providers for advice regarding your specific medical condition.

## 2014-12-10 NOTE — Progress Notes (Signed)
Note faxed.

## 2014-12-11 LAB — HEMOGLOBIN A1C
Hgb A1c MFr Bld: 5.6 %
Mean Plasma Glucose: 114 mg/dL

## 2014-12-12 LAB — COPPER, SERUM: COPPER: 131 ug/dL (ref 70–175)

## 2014-12-12 LAB — SPEP & IFE WITH QIG
ALBUMIN ELP: 56.2 % (ref 55.8–66.1)
ALPHA-1-GLOBULIN: 4.6 % (ref 2.9–4.9)
ALPHA-2-GLOBULIN: 11.2 % (ref 7.1–11.8)
BETA 2: 5.5 % (ref 3.2–6.5)
BETA GLOBULIN: 7.6 % — AB (ref 4.7–7.2)
GAMMA GLOBULIN: 14.9 % (ref 11.1–18.8)
IGM, SERUM: 68 mg/dL (ref 52–322)
IgA: 143 mg/dL (ref 69–380)
IgG (Immunoglobin G), Serum: 1060 mg/dL (ref 690–1700)
TOTAL PROTEIN, SERUM ELECTROPHOR: 7 g/dL (ref 6.0–8.3)

## 2014-12-12 LAB — UIFE/LIGHT CHAINS/TP QN, 24-HR UR
ALBUMIN, U: DETECTED
Total Protein, Urine: <4 mg/dL — ABNORMAL LOW (ref 5–24)

## 2014-12-14 LAB — VITAMIN B6: Vitamin B6: 6.5 ng/mL (ref 2.1–21.7)

## 2014-12-16 ENCOUNTER — Other Ambulatory Visit: Payer: Self-pay | Admitting: *Deleted

## 2014-12-16 DIAGNOSIS — R7989 Other specified abnormal findings of blood chemistry: Secondary | ICD-10-CM

## 2014-12-28 LAB — T4, FREE: Free T4: 0.88 ng/dL (ref 0.80–1.80)

## 2014-12-28 LAB — T3, FREE: T3, Free: 2.4 pg/mL (ref 2.3–4.2)

## 2015-01-17 ENCOUNTER — Telehealth: Payer: Self-pay | Admitting: Neurology

## 2015-01-17 ENCOUNTER — Other Ambulatory Visit: Payer: Self-pay | Admitting: *Deleted

## 2015-01-17 MED ORDER — PREGABALIN 200 MG PO CAPS: 200.0000 mg | ORAL_CAPSULE | Freq: Three times a day (TID) | ORAL | Status: DC

## 2015-01-17 NOTE — Telephone Encounter (Signed)
FYI

## 2015-01-17 NOTE — Telephone Encounter (Signed)
Patient has been given instructions and Rx sent to the pharmacy.

## 2015-01-17 NOTE — Telephone Encounter (Signed)
Pt states that she was to call back in about 4 weeks to let us know how she is doing.. she is doing about the same. She also needs us to call in a refill on the Lyrica she is taking it 4x aday to the drug store [place] that number is [phone] and pt phone number is [phone]

## 2015-01-17 NOTE — Telephone Encounter (Signed)
Please tell her to increase Lyrica to 200mg  in the morning, 100mg  in the afternoon, and 200mg  qhs x 1 week, then increase to 200mg  three times daily.  If she develops lightheadedness, go back to lower dose.  OK to send refill for Lyrica 200mg  three times daily, #90, 3 refills.  Donika K. Allena KatzPatel, DO

## 2015-03-11 ENCOUNTER — Encounter: Payer: Self-pay | Admitting: Neurology

## 2015-03-11 ENCOUNTER — Ambulatory Visit (INDEPENDENT_AMBULATORY_CARE_PROVIDER_SITE_OTHER): Payer: BLUE CROSS/BLUE SHIELD | Admitting: Neurology

## 2015-03-11 VITALS — BP 110/72 | HR 80 | Ht 63.0 in | Wt 253.1 lb

## 2015-03-11 DIAGNOSIS — E1140 Type 2 diabetes mellitus with diabetic neuropathy, unspecified: Secondary | ICD-10-CM

## 2015-03-11 DIAGNOSIS — E0842 Diabetes mellitus due to underlying condition with diabetic polyneuropathy: Secondary | ICD-10-CM

## 2015-03-11 MED ORDER — PREGABALIN 200 MG PO CAPS: 200.0000 mg | ORAL_CAPSULE | Freq: Three times a day (TID) | ORAL | Status: DC

## 2015-03-11 NOTE — Progress Notes (Signed)
Follow-up Visit   Date: 03/11/2015    ADELIN VENTRELLA MRN: [id number] DOB: Nov 28, 1955   Interim History: WARREN KUGELMAN is a 59 y.o. right-handed Caucasian female with GERD, fibromyalgia, hypertension, depression, migraine, diabetes mellitus type 2, vitamin B12 deficiency, chronic pain syndrome  returning to the clinic for follow-up of diabetic neuropathy.  The patient was accompanied to the clinic by self.  History of present illness: Starting around 2013, she developed sudden onset of numbness/tingling involving her right foot and within three months, she developed similar symptoms on the right. She also complains of burning sensation and intermittent stabbing pain over the arch of the feet. Symptoms are constant and worse at night and light pressure. Nothing that alleviates her pain. Discomfort is now at the level of mid-calf and involves her ankles and feet. She was started on Lyrica initially and is currently on 152m TID which seems to alleviate some of the pain. She takes oxycodone 337mevery 4-5 hours. In the fall of 2015, she developed intermittent numbness/tingling of the hands which is worse in the morning.   She walks with a cane for balance. No recent falls.   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     She also seen podiatry and a chiropractor. She was told her spine was totally out of alignment and received laser therapy to her feet for three weeks, but felt even worse so stopped going.   UPDATE 03/11/2015:  Since increasing Lyrica to 20034mhree times daily, she has noticed improvement of her pain, because she no longer has stabbing pain.  She was able to reduce her oxycodone to 72m20m4 times per day, whereas previously she was taking 4-5.  She continues to have spells of hot/cold sensation, but is overall pleased with her improvement.  Neuropathy labs were reviewed and returned normal.  Medications:  Current Outpatient  Prescriptions on File Prior to Visit  Medication Sig Dispense Refill  . benazepril-hydrochlorthiazide (LOTENSIN HCT) 20-25 MG per tablet Take 1 tablet by mouth daily.    . liMarland Kitchenocaine (XYLOCAINE) 5 % ointment Apply to feet twice daily as needed.  Use gloves. 35.44 g 3  . metformin (FORTAMET) 500 MG (OSM) 24 hr tablet Take 500 mg by mouth daily with breakfast.    . omeprazole (PRILOSEC) 40 MG capsule Take 40 mg by mouth daily.    . oxMarland Kitchencodone (ROXICODONE) 30 MG immediate release tablet Take 30 mg by mouth every 4 (four) hours as needed for pain.    . pregabalin (LYRICA) 200 MG capsule Take 1 capsule (200 mg total) by mouth 3 (three) times daily. Before starting the 200 mg tid, we would like for the patient to start taking 200 mg qam, 100 mg in the afternoon and 200 mg qhs. 90 capsule 3   No current facility-administered medications on file prior to visit.    Allergies: No Known Allergies  Review of Systems:  CONSTITUTIONAL: No fevers, chills, night sweats, or weight loss.  EYES: No visual changes or eye pain ENT: No hearing changes.  No history of nose bleeds.   RESPIRATORY: No cough, wheezing and shortness of breath.   CARDIOVASCULAR: Negative for chest pain, and palpitations.   GI: Negative for abdominal discomfort, blood in stools or black stools.  No recent change in bowel habits.   GU:  No history of incontinence.   MUSCLOSKELETAL: No history of joint pain or swelling.  No myalgias.   SKIN: Negative for lesions, rash,  and itching.   ENDOCRINE: Negative for cold or heat intolerance, polydipsia or goiter.   PSYCH:  No depression or anxiety symptoms.   NEURO: As Above.   Vital Signs:  BP 110/72 mmHg  Pulse 80  Ht '5\' 3"'  (1.6 m)  Wt 253 lb 2 oz (114.817 kg)  BMI 44.85 kg/m2  SpO2 95%  Neurological Exam: MENTAL STATUS including orientation to time, place, person, recent and remote memory, attention span and concentration, language, and fund of knowledge is normal.  Speech is not  dysarthric.  CRANIAL NERVES:  Pupils equal round and reactive to light.  Normal conjugate, extra-ocular eye movements in all directions of gaze.  No ptosis.  Face is symmetric.   MOTOR:  Motor strength is 5/5 in all extremities.    SENSORY: Vibration absent distal to ankles bilaterally.  COORDINATION/GAIT:  Gait is slightly wide-based due to body habitus  Data: Labs 12/16/2014:  ESR 45, vitamin B12 348, vitamin B6 6.5, copper 131, TSH 7.6, fT3 2.4, fT4 0.88, HbA1c 5.6  IMPRESSION: Mrs. Bunting is a 59 year-old female returning for evaluation of neuropathic pain affecting the feet. Her neurological examination shows a distal predominant peripheral neuropathy. I had extensive discussion with the patient regarding the pathogenesis, etiology, management, and natural course of neuropathy. Neuropathy tends to be slowly progressive, especially underlying etiology is not adequately managed. Although she has diabetes, her HbA1c from 2014 was 5.5 so it is difficult to attribute the severity of her symptoms to diabetes neuropathy.   From a symptomatic standpoint, her pain seems to be well controlled on Lyrica 278m three times daily.  Going forward, consider adding amitriptyline, Cymbalta, venlafaxine, or alpha-lipoic acid. I stressed that the goal is to reduce the amount of oxycodone, which she is slowly doing.   PLAN/RECOMMENDATIONS:  Continue Lyrica 2061mthree times daily Continue to taper oxycodone as able You can try taking vitamin B12 100031mdaily and see if that helps Return to clinic in 6 months   The duration of this appointment visit was 25 minutes of face-to-face time with the patient.  Greater than 50% of this time was spent in counseling, explanation of diagnosis, planning of further management, and coordination of care.   Thank you for allowing me to participate in patient's care.  If I can answer any additional questions, I would be pleased to do so.    Sincerely,    Darrin Koman  K. PatPosey ProntoO

## 2015-03-11 NOTE — Patient Instructions (Addendum)
Continue Lyrica 200mg  three times daily You can try taking vitamin B12 1000mcg daily and see if that helps Return to clinic in 6 months

## 2015-06-12 ENCOUNTER — Telehealth: Payer: Self-pay | Admitting: Neurology

## 2015-06-12 NOTE — Telephone Encounter (Signed)
Please have her reduce lyrica to  twice daily and see how she does.  We may need to add nortriptyline going forward.  Dalissa Lovin K. Allena Katz, DO

## 2015-06-12 NOTE — Telephone Encounter (Signed)
Pt went to see her family Dr. Samuel Jester recently for a 3 month checkup/ the Lyrica med  x3 qd is causing her to eat more and has gained at least 16 lbs in 3 months/ pt call back# [phone]

## 2015-06-12 NOTE — Telephone Encounter (Signed)
Please advise 

## 2015-06-12 NOTE — Telephone Encounter (Signed)
Called patient back.  No answer.  Left message for her to call me back.

## 2015-06-13 ENCOUNTER — Telehealth: Payer: Self-pay | Admitting: *Deleted

## 2015-06-13 NOTE — Telephone Encounter (Signed)
Patient given instructions

## 2015-06-13 NOTE — Telephone Encounter (Signed)
Patient returning you call in reference to her Lyrica Call back number [phone]

## 2015-06-13 NOTE — Telephone Encounter (Signed)
See next note

## 2015-07-04 ENCOUNTER — Encounter (HOSPITAL_COMMUNITY): Payer: Self-pay | Admitting: Vascular Surgery

## 2015-07-04 ENCOUNTER — Observation Stay (HOSPITAL_COMMUNITY)
Admission: EM | Admit: 2015-07-04 | Discharge: 2015-07-07 | Disposition: A | Discharge status: Home / Self Care | Payer: BLUE CROSS/BLUE SHIELD | Attending: Internal Medicine | Admitting: Internal Medicine

## 2015-07-04 ENCOUNTER — Emergency Department (HOSPITAL_COMMUNITY): Payer: BLUE CROSS/BLUE SHIELD

## 2015-07-04 DIAGNOSIS — R0600 Dyspnea, unspecified: Secondary | ICD-10-CM | POA: Insufficient documentation

## 2015-07-04 DIAGNOSIS — M797 Fibromyalgia: Secondary | ICD-10-CM | POA: Diagnosis not present

## 2015-07-04 DIAGNOSIS — L98499 Non-pressure chronic ulcer of skin of other sites with unspecified severity: Secondary | ICD-10-CM

## 2015-07-04 DIAGNOSIS — G8929 Other chronic pain: Secondary | ICD-10-CM | POA: Insufficient documentation

## 2015-07-04 DIAGNOSIS — R0683 Snoring: Secondary | ICD-10-CM | POA: Diagnosis not present

## 2015-07-04 DIAGNOSIS — D649 Anemia, unspecified: Secondary | ICD-10-CM | POA: Diagnosis not present

## 2015-07-04 DIAGNOSIS — I70209 Unspecified atherosclerosis of native arteries of extremities, unspecified extremity: Secondary | ICD-10-CM

## 2015-07-04 DIAGNOSIS — M79671 Pain in right foot: Secondary | ICD-10-CM | POA: Insufficient documentation

## 2015-07-04 DIAGNOSIS — E02 Subclinical iodine-deficiency hypothyroidism: Secondary | ICD-10-CM | POA: Insufficient documentation

## 2015-07-04 DIAGNOSIS — E119 Type 2 diabetes mellitus without complications: Secondary | ICD-10-CM

## 2015-07-04 DIAGNOSIS — I10 Essential (primary) hypertension: Secondary | ICD-10-CM | POA: Diagnosis not present

## 2015-07-04 DIAGNOSIS — L97519 Non-pressure chronic ulcer of other part of right foot with unspecified severity: Secondary | ICD-10-CM | POA: Diagnosis not present

## 2015-07-04 DIAGNOSIS — Z89411 Acquired absence of right great toe: Secondary | ICD-10-CM | POA: Insufficient documentation

## 2015-07-04 DIAGNOSIS — R0601 Orthopnea: Secondary | ICD-10-CM | POA: Insufficient documentation

## 2015-07-04 DIAGNOSIS — Z800 Family history of malignant neoplasm of digestive organs: Secondary | ICD-10-CM | POA: Insufficient documentation

## 2015-07-04 DIAGNOSIS — E0842 Diabetes mellitus due to underlying condition with diabetic polyneuropathy: Secondary | ICD-10-CM

## 2015-07-04 DIAGNOSIS — M14671 Charcot's joint, right ankle and foot: Secondary | ICD-10-CM | POA: Diagnosis not present

## 2015-07-04 DIAGNOSIS — Z8249 Family history of ischemic heart disease and other diseases of the circulatory system: Secondary | ICD-10-CM | POA: Diagnosis not present

## 2015-07-04 DIAGNOSIS — Z87891 Personal history of nicotine dependence: Secondary | ICD-10-CM | POA: Diagnosis not present

## 2015-07-04 DIAGNOSIS — Z9889 Other specified postprocedural states: Secondary | ICD-10-CM | POA: Diagnosis not present

## 2015-07-04 DIAGNOSIS — G4733 Obstructive sleep apnea (adult) (pediatric): Secondary | ICD-10-CM | POA: Insufficient documentation

## 2015-07-04 DIAGNOSIS — M2041 Other hammer toe(s) (acquired), right foot: Secondary | ICD-10-CM | POA: Diagnosis not present

## 2015-07-04 DIAGNOSIS — R0602 Shortness of breath: Secondary | ICD-10-CM

## 2015-07-04 DIAGNOSIS — L97509 Non-pressure chronic ulcer of other part of unspecified foot with unspecified severity: Secondary | ICD-10-CM

## 2015-07-04 DIAGNOSIS — K219 Gastro-esophageal reflux disease without esophagitis: Secondary | ICD-10-CM | POA: Diagnosis not present

## 2015-07-04 DIAGNOSIS — E662 Morbid (severe) obesity with alveolar hypoventilation: Secondary | ICD-10-CM | POA: Diagnosis not present

## 2015-07-04 DIAGNOSIS — M869 Osteomyelitis, unspecified: Secondary | ICD-10-CM | POA: Diagnosis not present

## 2015-07-04 DIAGNOSIS — I959 Hypotension, unspecified: Secondary | ICD-10-CM | POA: Insufficient documentation

## 2015-07-04 DIAGNOSIS — E11621 Type 2 diabetes mellitus with foot ulcer: Secondary | ICD-10-CM | POA: Diagnosis not present

## 2015-07-04 DIAGNOSIS — E1142 Type 2 diabetes mellitus with diabetic polyneuropathy: Principal | ICD-10-CM | POA: Insufficient documentation

## 2015-07-04 DIAGNOSIS — G2581 Restless legs syndrome: Secondary | ICD-10-CM | POA: Diagnosis not present

## 2015-07-04 DIAGNOSIS — R0609 Other forms of dyspnea: Secondary | ICD-10-CM

## 2015-07-04 DIAGNOSIS — Z7982 Long term (current) use of aspirin: Secondary | ICD-10-CM | POA: Diagnosis not present

## 2015-07-04 DIAGNOSIS — Z6841 Body Mass Index (BMI) 40.0 and over, adult: Secondary | ICD-10-CM | POA: Insufficient documentation

## 2015-07-04 DIAGNOSIS — F329 Major depressive disorder, single episode, unspecified: Secondary | ICD-10-CM | POA: Insufficient documentation

## 2015-07-04 DIAGNOSIS — M7989 Other specified soft tissue disorders: Secondary | ICD-10-CM

## 2015-07-04 DIAGNOSIS — J9811 Atelectasis: Secondary | ICD-10-CM | POA: Diagnosis not present

## 2015-07-04 DIAGNOSIS — E1151 Type 2 diabetes mellitus with diabetic peripheral angiopathy without gangrene: Secondary | ICD-10-CM | POA: Diagnosis not present

## 2015-07-04 DIAGNOSIS — R918 Other nonspecific abnormal finding of lung field: Secondary | ICD-10-CM | POA: Diagnosis not present

## 2015-07-04 DIAGNOSIS — R600 Localized edema: Secondary | ICD-10-CM | POA: Diagnosis not present

## 2015-07-04 DIAGNOSIS — Z794 Long term (current) use of insulin: Secondary | ICD-10-CM | POA: Diagnosis not present

## 2015-07-04 DIAGNOSIS — E1140 Type 2 diabetes mellitus with diabetic neuropathy, unspecified: Secondary | ICD-10-CM | POA: Diagnosis present

## 2015-07-04 DIAGNOSIS — R079 Chest pain, unspecified: Secondary | ICD-10-CM

## 2015-07-04 LAB — CBC
HCT: 31.3 % — ABNORMAL LOW (ref 36.0–46.0)
Hemoglobin: 10.1 g/dL — ABNORMAL LOW (ref 12.0–15.0)
MCH: 29.6 pg (ref 26.0–34.0)
MCHC: 32.3 g/dL (ref 30.0–36.0)
MCV: 91.8 fL (ref 78.0–100.0)
Platelets: 289 10*3/uL (ref 150–400)
RBC: 3.41 MIL/uL — ABNORMAL LOW (ref 3.87–5.11)
RDW: 13.6 % (ref 11.5–15.5)
WBC: 8.6 10*3/uL (ref 4.0–10.5)

## 2015-07-04 LAB — GLUCOSE, CAPILLARY: GLUCOSE-CAPILLARY: 130 mg/dL — AB (ref 65–99)

## 2015-07-04 LAB — BASIC METABOLIC PANEL
Anion gap: 11 (ref 5–15)
BUN: 8 mg/dL (ref 6–20)
CO2: 27 mmol/L (ref 22–32)
Calcium: 9.3 mg/dL (ref 8.9–10.3)
Chloride: 96 mmol/L — ABNORMAL LOW (ref 101–111)
Creatinine, Ser: 1.39 mg/dL — ABNORMAL HIGH (ref 0.44–1.00)
GFR calc Af Amer: 48 mL/min — ABNORMAL LOW (ref >60)
GFR calc non Af Amer: 41 mL/min — ABNORMAL LOW (ref >60)
Glucose, Bld: 94 mg/dL (ref 65–99)
Potassium: 4.2 mmol/L (ref 3.5–5.1)
Sodium: 134 mmol/L — ABNORMAL LOW (ref 135–145)

## 2015-07-04 LAB — T4, FREE: Free T4: 0.73 ng/dL (ref 0.61–1.12)

## 2015-07-04 LAB — I-STAT TROPONIN, ED: Troponin i, poc: 0 ng/mL (ref 0.00–0.08)

## 2015-07-04 LAB — BRAIN NATRIURETIC PEPTIDE: B NATRIURETIC PEPTIDE 5: 29.3 pg/mL (ref 0.0–100.0)

## 2015-07-04 LAB — TSH: TSH: 10.433 u[IU]/mL — ABNORMAL HIGH (ref 0.350–4.500)

## 2015-07-04 MED ORDER — INSULIN ASPART 100 UNIT/ML ~~LOC~~ SOLN
0.0000 [IU] | Freq: Three times a day (TID) | SUBCUTANEOUS | Status: DC
  Administered 2015-07-06 – 2015-07-07 (×2): 1 [IU] via SUBCUTANEOUS

## 2015-07-04 MED ORDER — ALBUTEROL SULFATE (2.5 MG/3ML) 0.083% IN NEBU
5.0000 mg | INHALATION_SOLUTION | Freq: Once | RESPIRATORY_TRACT | Status: AC
  Administered 2015-07-04: 5 mg via RESPIRATORY_TRACT
  Filled 2015-07-04: qty 6

## 2015-07-04 MED ORDER — PREGABALIN 75 MG PO CAPS
150.0000 mg | ORAL_CAPSULE | Freq: Every day | ORAL | Status: DC
  Administered 2015-07-04 – 2015-07-06 (×3): 150 mg via ORAL
  Filled 2015-07-04 (×3): qty 2

## 2015-07-04 MED ORDER — ENOXAPARIN SODIUM 40 MG/0.4ML ~~LOC~~ SOLN
40.0000 mg | SUBCUTANEOUS | Status: DC
Start: 2015-07-04 — End: 2015-07-07
  Administered 2015-07-04 – 2015-07-06 (×3): 40 mg via SUBCUTANEOUS
  Filled 2015-07-04 (×6): qty 0.4

## 2015-07-04 MED ORDER — INSULIN ASPART 100 UNIT/ML ~~LOC~~ SOLN: 0.0000 [IU] | Freq: Every day | SUBCUTANEOUS | Status: DC

## 2015-07-04 MED ORDER — SODIUM CHLORIDE 0.9 % IV BOLUS (SEPSIS)
500.0000 mL | Freq: Once | INTRAVENOUS | Status: AC
  Administered 2015-07-04: 500 mL via INTRAVENOUS

## 2015-07-04 MED ORDER — PREGABALIN 100 MG PO CAPS
200.0000 mg | ORAL_CAPSULE | Freq: Every morning | ORAL | Status: DC
  Administered 2015-07-05: 200 mg via ORAL
  Filled 2015-07-04: qty 2

## 2015-07-04 MED ORDER — FUROSEMIDE 10 MG/ML IJ SOLN: 40.0000 mg | Freq: Once | INTRAMUSCULAR | Status: DC

## 2015-07-04 MED ORDER — SODIUM CHLORIDE 0.9 % IJ SOLN
3.0000 mL | Freq: Two times a day (BID) | INTRAMUSCULAR | Status: DC
  Administered 2015-07-05 – 2015-07-06 (×3): 3 mL via INTRAVENOUS

## 2015-07-04 MED ORDER — SODIUM CHLORIDE 0.9 % IJ SOLN
3.0000 mL | Freq: Two times a day (BID) | INTRAMUSCULAR | Status: DC
  Administered 2015-07-04 – 2015-07-07 (×5): 3 mL via INTRAVENOUS

## 2015-07-04 NOTE — H&P (Signed)
Date: 07/04/2015               Patient Name:  Erin [name] MRN: [id number]  DOB: 05/21/1956 Age / Sex: 59 y.o., female   PCP: Samuel Jester, DO         Medical Service: Internal Medicine Teaching Service         Attending Physician: Dr. Tyson Alias, MD    First Contact: Dr. Selina Cooley Pager: [phone]  Second Contact: Dr. Evelena Peat Pager: [phone]       After Hours (After 5p/  First Contact Pager: [phone]  weekends / holidays): Second Contact Pager: [phone]   Chief Complaint: "My legs have been swelling since they increased my Lyrica."  History of Present Illness: Erin [name] is an obese 59 year-old Caucasian lady with a history of type II diabetes with severe neuropathy, hypertension, and fibromyalgia, who presents with gradual swelling in her legs over the last month after he neurologist increased her dose of Lyrica to 200mg  three times daily for her diabetic neuropathy. She is also taking benazepril and 30mg  oxycodone two to three times per day for her chronic pain. Her husband says she can hardly walk around the house and gets winded even when carrying the laundry basket. She sleeps with one pillow at night and has no problem lying flat. However when she does sleep, she snores very loudly and often wakes up. She tries to nap frequently during the day and has been very tired for years. Since January, she has gained 45 pounds and her family says she looks "swollen." Additionally, she complains of some cracks in the heels of her feet that are painful and bleed on the sheets at night. She also has a sore sport on her right second toe and has had her right first toe amputated years ago. No fevers or chills.  In the emergency department, her blood pressure on the monitor was 70/40 however manual cuff showed 100/60 and her arms are very large. She received 40mg  IV lasix. Chest x-ray showed low lung volumes without perihilar thickening, bronchial cuffing, or pleural  effusions.  Meds: Current Facility-Administered Medications  Medication Dose Route Frequency Provider Last Rate Last Dose  . furosemide (LASIX) injection 40 mg  40 mg Intravenous Once [employer], PA-C   Stopped at 07/04/15 1631   Current Outpatient Prescriptions  Medication Sig Dispense Refill  . benazepril-hydrochlorthiazide (LOTENSIN HCT) 20-25 MG per tablet Take 1 tablet by mouth daily.    Marland Kitchen ibuprofen (ADVIL,MOTRIN) 200 MG tablet Take 200 mg by mouth every 6 (six) hours as needed for mild pain or moderate pain.    Marland Kitchen metformin (FORTAMET) 500 MG (OSM) 24 hr tablet Take 500 mg by mouth daily with breakfast.    . omeprazole (PRILOSEC) 40 MG capsule Take 40 mg by mouth daily.    Marland Kitchen oxycodone (ROXICODONE) 30 MG immediate release tablet Take 30 mg by mouth every 4 (four) hours as needed for pain.    . pregabalin (LYRICA) 200 MG capsule Take 1 capsule (200 mg total) by mouth 3 (three) times daily. (Patient taking differently: Take 200 mg by mouth 2 (two) times daily. ) 90 capsule 6    Allergies: Allergies as of 07/04/2015  . (No Known Allergies)   Past Medical History  Diagnosis Date  . Diabetes mellitus   . Hypertension   . Fibromyalgia   . Peripheral neuropathy   . Hernia   . Depression   . GERD (gastroesophageal reflux disease)   .  Chronic pain   . Miscarriage     twins  . Carpal tunnel syndrome   . TMJ syndrome    Past Surgical History  Procedure Laterality Date  . Tonsillectomy    . Abdominal surgery     Family History  Problem Relation Age of Onset  . Aneurysm Father     Deceased, 10  . CAD Father   . Heart disease Father   . Cancer Father   . Pancreatic cancer Mother     Deceased, 70   Social History   Social History  . Marital Status: Married    Spouse Name: N/A  . Number of Children: N/A  . Years of Education: N/A   Occupational History  . Not on file.   Social History Main Topics  . Smoking status: Former Games developer  . Smokeless tobacco: Not on  file  . Alcohol Use: No  . Drug Use: No  . Sexual Activity: Not on file   Other Topics Concern  . Not on file   Social History Narrative   Lives with husband in a 2 story home.  Has 3 grown daughters and 3 granddaughters.  On disability for many different reasons.    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     Review of Systems  Constitutional: Positive for malaise/fatigue. Negative for fever, chills and weight loss.  HENT: Negative for hearing loss.   Eyes: Negative for blurred vision and double vision.  Respiratory: Positive for shortness of breath. Negative for cough.   Cardiovascular: Positive for leg swelling. Negative for chest pain, palpitations, orthopnea, claudication and PND.  Skin: Negative for rash.  Neurological: Negative for headaches.    Physical Exam: Blood pressure 81/53, pulse 79, temperature 98.2 F (36.8 C), temperature source Oral, resp. rate 10, SpO2 99 %.  General: Lying flat in bed breathing just fine HEENT: She has subtle malar erythema. Short thyromental distance. Mallampati class IV.  Cardiac: RRR, no rubs, murmurs or gallops Pulm: clear to auscultation bilaterally, moving normal volumes of air Abd: soft, nontender, nondistended, BS present Ext: 1+ pitting edema bilaterally. Right first toe amputated, right second toe with distal open ulcer without apparent erythema or warmth. Bilateral heels with fissures, not overtly superinfected.  Lab results: Basic Metabolic Panel:  Recent Labs  29/56/21 1224  NA 134*  K 4.2  CL 96*  CO2 27  GLUCOSE 94  BUN 8  CREATININE 1.39*  CALCIUM 9.3   CBC:  Recent Labs  07/04/15 1224  WBC 8.6  HGB 10.1*  HCT 31.3*  MCV 91.8  PLT 289   BNP 29.3  Imaging results:  Dg Chest Port 1 View  07/04/2015   CLINICAL DATA:  Shortness breath and wheezing.  EXAM: PORTABLE CHEST - 1 VIEW  COMPARISON:  Two-view chest x-ray 10/02/2011  FINDINGS: The  heart size and pulmonary interstitium or exaggerated by low lung volumes. Mild bibasilar atelectasis is present. No other focal airspace disease is present. The visualized soft tissues and bony thorax are unremarkable.  Dental implants are noted.  IMPRESSION: 1. Low lung volumes and mild bibasilar atelectasis.   Electronically Signed   By: Marin Roberts M.D.   On: 07/04/2015 14:05    Other results: EKG: Rate 80, prolonged PR, normal axis, T wave inversion in III, aVR, V1, no other ST changes, poor R wave progression in precordial leads  Assessment & Plan by Problem: Erin [name] is an obese  59 year-old lady presenting with year-long history of daytime somnolence, dyspnea on exertion, and snoring, with a month-long history of lower extremity edema that has increased with subsequent increases in her pregabalin dosage hikes. Although Occam's razor suggests this is congestive heart failure, her lungs sound clear, her chest x-ray is normal, and her BNP is 29. Alternatively, I think she has two concomitant problems: I think that her fatigue and dyspnea on exertion is due to undiagnosed obstructive sleep apnea and/or obesity hypoventilation syndrome and her lower extremity edema is due to her pregabalin. She may also have right-sided heart failure from chronic hypoxia which could explain the clear lungs with peripheral edema, although she did not have JVD or hepatojugular reflex on exam. This lower extremity edema is causing her heels to fissure which could pose a serious problem given her severe diabetic neuropathy and questionable arterial blood flow to this area given her history of amputation and distal ischemia on the adjacent toe. She is also hypotensive which is odd but can be explained by her large arms. She may have underlying adrenal insufficiency for which I will order an AM cortisol. The malar erythema triggered me to consider lupus as a diagnosis given her complaints of arthralgias, fatigue, but  she doesn't have any other symptoms.  Dyspnea on exertion: Per above, my differential includes obstructive sleep apnea, obesity hypoventilation syndrome, less likely right-sided heart failure, and even less likely COPD. -She will certainly need a sleep study as an outpatient, perhaps also PFTs, ABG, and MEP/MIP to evaluate for obesity hypoventilation syndrome  Lower extremity edema: We will taper her off of her pregabalin over the course of a week. She is currently on 200mg  TID. We will talk to pharmacy tomorrow to discuss a tapering gameplan. Wilfrid Lund give 150mg  Lyrica BID tomorrow and taper over the course of the week down to 100mg  BID -May consider venlafaxine or amitryptiline upon discharge  Hypotension: She is asymptomatic although her pressures have been in 100s/60s. I question whether these pressures are accurate because her arms are so large. I also considered her high dose of oxycodone (30mg ) might be causing this but she last took this 2 days ago. Will order an AM cortisol to assess for adrenal insufficiency and hold on the furosemide and home antihypertensives. Gave her a 500cc bolus. -Follow-up morning cortisol  Fissures on heels: Per above, this is from swelling whether due to Lyrica or right-sided heart failure. We will obtain ABIs to assess. -Obtain ABIs -Wound care consulted  Dispo: Disposition is deferred at this time, awaiting improvement of current medical problems. Anticipated discharge in approximately 1 day.   The patient does have a current PCP Samuel Jester, DO) and does need an [facility] hospital follow-up appointment after discharge.  The patient does not know have transportation limitations that hinder transportation to clinic appointments.  Signed: Selina Cooley, MD 07/04/2015, 6:10 PM

## 2015-07-04 NOTE — ED Notes (Addendum)
Pt reports to the ED for eval of bilateral lower leg swelling and SOB with minimal exertion. She also has orthopnea. The swelling has been going on x 1 month. She was started on Lasix 2 weeks ago but the swelling is not going down. She has also had a 7 lb weight increase in the past 2 weeks. She was started on Lyrica 1 month ago and the swelling began shortly thereafter, however they have cut her does back and her swelling has not decreased. Denies any CP. She was sent here from her PCP. Reports dizziness on standing. Pt A&Ox4, resp e/u, and skin warm and dry.

## 2015-07-04 NOTE — ED Provider Notes (Signed)
CSN: [id number]     Arrival date & time 07/04/15  1154 History   First MD Initiated Contact with Patient 07/04/15 1242     Chief Complaint  Patient presents with  . Leg Swelling  . Hypotension     (Consider location/radiation/quality/duration/timing/severity/associated sxs/prior Treatment) The history is provided by the patient, a relative and medical records. No language interpreter was used.    Erin [name] is a 59 y.o. female  with a hx of non-insulin-dependent diabetes, hypertension, fibromyalgia, depression, GERD, chronic pain presents to the Emergency Department from her primary care office complaining of gradual, persistent, progressively worsening swelling in her bilateral lower legs with associated dyspnea, orthopnea and SOB onset 3-4 days ago. Patient reports she has had swelling in her legs 1 month. She reports at that time her neurologist increased her Lyrica to 200 mg 3 times per day. After her legs began to swell she was taken off the Lyrica. Lasix was added 2 weeks ago and patient has had a 7 pound weight increase in the last 1 week in spite of Lasix usage.  She reports that in the last few days her shortness of breath has continued to increase and she is unable to take even a few steps due to significant dyspnea on exertion. She reports she is unable to lie back at all due to significant orthopnea. She denies a history of myopathy. She denies recent or current chest pain, nausea, diaphoresis. Nothing seems to make her symptoms better or worse. Her has been reports that she is generally deconditioned due to lack of exercise secondary to her peripheral neuropathy, but the symptoms have been much worse the last week.   Past Medical History  Diagnosis Date  . Diabetes mellitus   . Hypertension   . Fibromyalgia   . Peripheral neuropathy   . Hernia   . Depression   . GERD (gastroesophageal reflux disease)   . Chronic pain   . Miscarriage     twins  . Carpal tunnel syndrome    . TMJ syndrome    Past Surgical History  Procedure Laterality Date  . Tonsillectomy    . Abdominal surgery     Family History  Problem Relation Age of Onset  . Aneurysm Father     Deceased, 60  . CAD Father   . Heart disease Father   . Cancer Father   . Pancreatic cancer Mother     Deceased, 7   Social History  Substance Use Topics  . Smoking status: Former Games developer  . Smokeless tobacco: None  . Alcohol Use: No   OB History    No data available     Review of Systems  Constitutional: Positive for fatigue and unexpected weight change. Negative for fever, diaphoresis and appetite change.  HENT: Negative for mouth sores.   Eyes: Negative for visual disturbance.  Respiratory: Positive for shortness of breath. Negative for cough, chest tightness and wheezing.   Cardiovascular: Positive for leg swelling. Negative for chest pain.  Gastrointestinal: Negative for nausea, vomiting, abdominal pain, diarrhea and constipation.  Endocrine: Negative for polydipsia, polyphagia and polyuria.  Genitourinary: Negative for dysuria, urgency, frequency and hematuria.  Musculoskeletal: Negative for back pain and neck stiffness.  Skin: Negative for rash.  Allergic/Immunologic: Negative for immunocompromised state.  Neurological: Negative for syncope, light-headedness and headaches.  Hematological: Does not bruise/bleed easily.  Psychiatric/Behavioral: Negative for sleep disturbance. The patient is not nervous/anxious.       Allergies  Review of patient's allergies  indicates no known allergies.  Home Medications   Prior to Admission medications   Medication Sig Start Date End Date Taking? Authorizing Provider  benazepril-hydrochlorthiazide (LOTENSIN HCT) 20-25 MG per tablet Take 1 tablet by mouth daily.   Yes Historical Provider, MD  ibuprofen (ADVIL,MOTRIN) 200 MG tablet Take 200 mg by mouth every 6 (six) hours as needed for mild pain or moderate pain.   Yes Historical Provider, MD   metformin (FORTAMET) 500 MG (OSM) 24 hr tablet Take 500 mg by mouth daily with breakfast.   Yes Historical Provider, MD  omeprazole (PRILOSEC) 40 MG capsule Take 40 mg by mouth daily.   Yes Historical Provider, MD  oxycodone (ROXICODONE) 30 MG immediate release tablet Take 30 mg by mouth every 4 (four) hours as needed for pain.   Yes Historical Provider, MD  pregabalin (LYRICA) 200 MG capsule Take 1 capsule (200 mg total) by mouth 3 (three) times daily. Patient taking differently: Take 200 mg by mouth 2 (two) times daily.  03/11/15  Yes Donika K Patel, DO   BP 93/53 mmHg  Pulse 75  Temp(Src) 98.2 F (36.8 C) (Oral)  Resp 15  SpO2 100% Physical Exam  Constitutional: She is oriented to person, place, and time. She appears well-developed and well-nourished. No distress.  Awake, alert, nontoxic appearance  HENT:  Head: Normocephalic and atraumatic.  Mouth/Throat: Oropharynx is clear and moist. No oropharyngeal exudate.  Eyes: Conjunctivae are normal. No scleral icterus.  Neck: Normal range of motion. Neck supple.  Cardiovascular: Normal rate, regular rhythm, normal heart sounds and intact distal pulses.   No murmur heard. Pulmonary/Chest: Effort normal. No respiratory distress. She has wheezes.  Equal chest expansion Mild expiratory wheezes heard throughout but no audible rales No tachypnea or increased work of breathing; no accessory muscle usage  Abdominal: Soft. Bowel sounds are normal. She exhibits no mass. There is no tenderness. There is no rebound and no guarding.  Obese Soft and nontender  Musculoskeletal: Normal range of motion. She exhibits edema.  2+ pitting edema  Lymphadenopathy:    She has no cervical adenopathy.  Neurological: She is alert and oriented to person, place, and time.  Speech is clear and goal oriented Moves extremities without ataxia  Skin: Skin is warm and dry. No rash noted. She is not diaphoretic. No erythema.  Psychiatric: She has a normal mood and  affect.  Nursing note and vitals reviewed.   ED Course  Procedures (including critical care time) Labs Review Labs Reviewed  BASIC METABOLIC PANEL - Abnormal; Notable for the following:    Sodium 134 (*)    Chloride 96 (*)    Creatinine, Ser 1.39 (*)    GFR calc non Af Amer 41 (*)    GFR calc Af Amer 48 (*)    All other components within normal limits  CBC - Abnormal; Notable for the following:    RBC 3.41 (*)    Hemoglobin 10.1 (*)    HCT 31.3 (*)    All other components within normal limits  BRAIN NATRIURETIC PEPTIDE  I-STAT TROPOININ, ED    Imaging Review Dg Chest Port 1 View  07/04/2015   CLINICAL DATA:  Shortness breath and wheezing.  EXAM: PORTABLE CHEST - 1 VIEW  COMPARISON:  Two-view chest x-ray 10/02/2011  FINDINGS: The heart size and pulmonary interstitium or exaggerated by low lung volumes. Mild bibasilar atelectasis is present. No other focal airspace disease is present. The visualized soft tissues and bony thorax are unremarkable.  Dental  implants are noted.  IMPRESSION: 1. Low lung volumes and mild bibasilar atelectasis.   Electronically Signed   By: Marin Roberts M.D.   On: 07/04/2015 14:05   I have personally reviewed and evaluated these images and lab results as part of my medical decision-making.   EKG Interpretation None      MDM   Final diagnoses:  Shortness of breath  DOE (dyspnea on exertion)  Leg swelling  Diabetic polyneuropathy associated with diabetes mellitus due to underlying condition   LORALIE MALTA presents from her primary care with progressing symptoms of fluid overload including dyspnea on exertion and orthopnea. She presents hypotensive here in the emergency department with an automatic blood pressure of 77 systolic however manual blood pressure is 98/60. She is mentating well and has strong radial and dorsalis pedal pulses even with readings in the 70s systolic; do not believe this is an accurate blood pressure.  Portable  chest with low lung volumes and mild bibasilar atelectasis but no frank pulmonary edema. Elevation in serum creatinine to 1.39. Mild anemia at 10.1.  Troponin negative and BNP is not elevated. EKG nonischemic.  Patient with decreasing oxygen saturations with movement including lying flat and attempt to get onto the bed pan into the low to mid 80s.   She denies chest pain. Oxygen saturations while at rest in bed. Patient needs a gentle diuresis, will proceed with admission.  The patient was discussed with and seen by Dr. Juleen China who agrees with the treatment plan.  3:29 PM Discussed with Dr. Andrey Campanile of Internal Medicine who will admit for further evaluation.      Erin Client Jasemine Nawaz, PA-C 07/04/15 1535  Raeford Razor, MD 07/09/15 1357

## 2015-07-04 NOTE — ED Notes (Signed)
attempted report 

## 2015-07-05 ENCOUNTER — Observation Stay (HOSPITAL_COMMUNITY): Payer: BLUE CROSS/BLUE SHIELD

## 2015-07-05 DIAGNOSIS — X58XXXA Exposure to other specified factors, initial encounter: Secondary | ICD-10-CM

## 2015-07-05 DIAGNOSIS — I959 Hypotension, unspecified: Secondary | ICD-10-CM

## 2015-07-05 DIAGNOSIS — S90921A Unspecified superficial injury of right foot, initial encounter: Secondary | ICD-10-CM | POA: Diagnosis not present

## 2015-07-05 DIAGNOSIS — S90922A Unspecified superficial injury of left foot, initial encounter: Secondary | ICD-10-CM | POA: Diagnosis not present

## 2015-07-05 DIAGNOSIS — R0609 Other forms of dyspnea: Secondary | ICD-10-CM | POA: Diagnosis not present

## 2015-07-05 DIAGNOSIS — I998 Other disorder of circulatory system: Secondary | ICD-10-CM

## 2015-07-05 DIAGNOSIS — M79672 Pain in left foot: Secondary | ICD-10-CM

## 2015-07-05 DIAGNOSIS — E1142 Type 2 diabetes mellitus with diabetic polyneuropathy: Secondary | ICD-10-CM | POA: Diagnosis not present

## 2015-07-05 LAB — COMPREHENSIVE METABOLIC PANEL
ALT: 23 U/L (ref 14–54)
AST: 38 U/L (ref 15–41)
Albumin: 3.7 g/dL (ref 3.5–5.0)
Alkaline Phosphatase: 130 U/L — ABNORMAL HIGH (ref 38–126)
Anion gap: 13 (ref 5–15)
BUN: 12 mg/dL (ref 6–20)
CO2: 28 mmol/L (ref 22–32)
Calcium: 9.7 mg/dL (ref 8.9–10.3)
Chloride: 95 mmol/L — ABNORMAL LOW (ref 101–111)
Creatinine, Ser: 1.17 mg/dL — ABNORMAL HIGH (ref 0.44–1.00)
GFR calc Af Amer: 59 mL/min — ABNORMAL LOW (ref >60)
GFR calc non Af Amer: 51 mL/min — ABNORMAL LOW (ref >60)
Glucose, Bld: 96 mg/dL (ref 65–99)
Potassium: 4.3 mmol/L (ref 3.5–5.1)
Sodium: 136 mmol/L (ref 135–145)
Total Bilirubin: 0.4 mg/dL (ref 0.3–1.2)
Total Protein: 7.2 g/dL (ref 6.5–8.1)

## 2015-07-05 LAB — CBC
HCT: 29.6 % — ABNORMAL LOW (ref 36.0–46.0)
Hemoglobin: 9.6 g/dL — ABNORMAL LOW (ref 12.0–15.0)
MCH: 29.6 pg (ref 26.0–34.0)
MCHC: 32.4 g/dL (ref 30.0–36.0)
MCV: 91.4 fL (ref 78.0–100.0)
Platelets: 284 10*3/uL (ref 150–400)
RBC: 3.24 MIL/uL — ABNORMAL LOW (ref 3.87–5.11)
RDW: 13.7 % (ref 11.5–15.5)
WBC: 9.2 10*3/uL (ref 4.0–10.5)

## 2015-07-05 LAB — GLUCOSE, CAPILLARY
GLUCOSE-CAPILLARY: 120 mg/dL — AB (ref 65–99)
GLUCOSE-CAPILLARY: 99 mg/dL (ref 65–99)
Glucose-Capillary: 114 mg/dL — ABNORMAL HIGH (ref 65–99)
Glucose-Capillary: 100 mg/dL — ABNORMAL HIGH (ref 65–99)

## 2015-07-05 LAB — CORTISOL-AM, BLOOD: CORTISOL - AM: 18.3 ug/dL (ref 6.7–22.6)

## 2015-07-05 LAB — SEDIMENTATION RATE: SED RATE: 90 mm/h — AB (ref 0–22)

## 2015-07-05 LAB — HEMOGLOBIN A1C
Hgb A1c MFr Bld: 6 % — ABNORMAL HIGH (ref 4.8–5.6)
Mean Plasma Glucose: 126 mg/dL

## 2015-07-05 MED ORDER — LORAZEPAM 1 MG PO TABS: 1.0000 mg | ORAL_TABLET | ORAL | Status: AC

## 2015-07-05 MED ORDER — NORTRIPTYLINE HCL 25 MG PO CAPS
25.0000 mg | ORAL_CAPSULE | Freq: Every day | ORAL | Status: DC
  Filled 2015-07-05: qty 1

## 2015-07-05 MED ORDER — ONDANSETRON HCL 4 MG/2ML IJ SOLN
4.0000 mg | Freq: Once | INTRAMUSCULAR | Status: AC
  Administered 2015-07-05: 4 mg via INTRAVENOUS
  Filled 2015-07-05: qty 2

## 2015-07-05 MED ORDER — ASPIRIN 81 MG PO CHEW
81.0000 mg | CHEWABLE_TABLET | Freq: Every day | ORAL | Status: DC
  Administered 2015-07-05 – 2015-07-07 (×3): 81 mg via ORAL
  Filled 2015-07-05 (×3): qty 1

## 2015-07-05 MED ORDER — PREGABALIN 75 MG PO CAPS
150.0000 mg | ORAL_CAPSULE | Freq: Every morning | ORAL | Status: DC
  Administered 2015-07-06 – 2015-07-07 (×2): 150 mg via ORAL
  Filled 2015-07-05 (×2): qty 2

## 2015-07-05 MED ORDER — LORAZEPAM 2 MG/ML IJ SOLN
1.0000 mg | Freq: Once | INTRAMUSCULAR | Status: AC
  Administered 2015-07-05: 1 mg via INTRAMUSCULAR
  Filled 2015-07-05: qty 1

## 2015-07-05 MED ORDER — OXYCODONE-ACETAMINOPHEN 5-325 MG PO TABS
1.0000 | ORAL_TABLET | ORAL | Status: DC | PRN
  Administered 2015-07-05 – 2015-07-07 (×11): 1 via ORAL
  Filled 2015-07-05 (×11): qty 1

## 2015-07-05 MED ORDER — MORPHINE SULFATE (PF) 2 MG/ML IV SOLN
1.0000 mg | Freq: Once | INTRAVENOUS | Status: AC
  Administered 2015-07-05: 1 mg via INTRAVENOUS
  Filled 2015-07-05: qty 1

## 2015-07-05 MED ORDER — PROMETHAZINE HCL 25 MG PO TABS
12.5000 mg | ORAL_TABLET | Freq: Three times a day (TID) | ORAL | Status: DC | PRN
  Administered 2015-07-05: 12.5 mg via ORAL
  Filled 2015-07-05: qty 1

## 2015-07-05 NOTE — H&P (Signed)
Internal Medicine Attending Admission Note  I saw and evaluated the patient. I reviewed the resident's note and I agree with the resident's findings and plan as documented in the resident's note.  Assessment & Plan by Problem:  Active Problems:   Dyspnea  Dyspnea: Based on the clinical course and her exam, I agree the patient almost certainly has obstructive sleep apnea. Given her body habitus she is at risk also for obesity hypoventilation syndrome. Not currently on any management, so she is also at risk for pulmonary hypertension which could contribute to the lower extremity edema. - Transthoracic echocardiogram is appropriate, tricuspid regurg velocity will help gauge pulmonary pressures - Agree with outpatient sleep study, would also arrange for an outpatient pulmonary function testing with lung volumes - We'll need to initiate nocturnal positive airway pressure in the future - Can also increase diuresis if there is elevated pulmonary pressures  Lower Extremity Wounds: Given the amount of the patient's pain which is not been well treated on neuropathic regimens, the appearance of the dusky toe and the very faint pulses on my exam today, I think she probably has significant peripheral vascular disease and this might even be rest claudication.  - Peripheral artery Dopplers of both lower extremities today - Would consult vascular surgery if there is significantly diminished flow - Plain film of the right foot and ESR to evaluate for osteomyelitis - Would start aspirin 81 mg now for antiplatelet therapy   Chief Complaint(s):  History - key components related to admission:  Lab results: Reviewed in Epic  Physical Exam - key components related to admission:  Filed Vitals:   07/04/15 2004 07/05/15 0116 07/05/15 0451 07/05/15 0900  BP: 115/61 127/58 127/57 130/73  Pulse: 77 86 73 78  Temp: 98.8 F (37.1 C) 98.4 F (36.9 C) 98.4 F (36.9 C) 97.9 F (36.6 C)  TempSrc: Oral Oral Oral  Oral  Resp: '18 18 18 18  ' Height: '5\' 3"'  (1.6 m)     Weight: 260 lb 8 oz (118.162 kg)  257 lb (116.574 kg)   SpO2: 98% 99% 95% 98%   General: Chronically ill-appearing woman, lying flat in bed, appears a little uncomfortable ENT: Moist mucous membranes, dentures in place CV: Regular rate and rhythm, no murmurs Respirations: Unlabored, clear to auscultation in the anterior fields Extremities: Right foot with great toe amputation, second toe is dusky at the tip, wounds at the bilateral heels with 2 cm linear eschars, no other erythema on the feet, mild tinea pedis, pulses in both feet are very faint.

## 2015-07-05 NOTE — Consult Note (Addendum)
WOC wound consult note Reason for Consult: DFU Wound type: Neuropathic ulcers, hammertoe, bilateral heel fissures, restless leg movement suspicious for arterial insufficiency vs neuropathy Pressure Ulcer POA: No Measurement: Distal tip of right foot second digit:  .08 cm round with the presence of dry eschar obscuring depth (full thickness).  Elevation of second digit on the right foot (at hammertoe): 0.4cm round with the presence of dried serum (scab) obscuring depth (partial thickness). Bilateral heel fissures measuring 2cm x 0.2cm and undetermined depth (full thickness) Wound bed: As described above. Drainage (amount, consistency, odor) None Periwound:Inact, dry with second digit of right foot with residual erythema (entire digit) Dressing procedure/placement/frequency: I will implement conservative therapy to the second digit on the right foot (betadine swabstick application and air-dry twice daily) until a MRI can be obtained to determine the presence of osteo. Patient with two deep heel fissures for which I have initiated in-house care with petrolatum gauze dressings after cleansing with our skin conditioning cleanser.  There is some remaining pedal edema and for this I have suggested elevation of the extremities while in bed. It is suggested that consultation with Orthopedics for follow up and consideration of digit-and-limb sparing interventions (custom orthotics, surgical correction of hammertoe, etc.) may be appropriate.  Patient reports restless legs, near-constant movement that keeps her awake at night. She also "hangs her feet off of the couch at night" which is a behavior often associated with arterial insufficiency in addition to neuropathy.  I see no recent ABI in the record; this may assist with diagnostics and assist other consulting teams. Thank you for this complex consultation that is somewhat outside the scope of WOC Nursing practice.  The WOC nursing team will not follow, but will  remain available to this patient, the nursing and medical teams.  Please re-consult if needed. Thanks, Ladona Mow, MSN, RN, GNP, [place], [facility]ON-AP [phone])

## 2015-07-05 NOTE — Progress Notes (Addendum)
Patient ID: Erin [name], female   DOB: [birth year], 59 y.o.   MRN: [id number]   Subjective: [name] had a hard time sleeping last night because of the pain in her feet. She says she can hardly walk so it's tough to say whether or not she has claudication symptoms. Her right toe continues to hurt but she denies fever or chills.   Objective: Vital signs in last 24 hours: Filed Vitals:   07/04/15 2004 07/05/15 0116 07/05/15 0451 07/05/15 0900  BP: 115/61 127/58 127/57 130/73  Pulse: 77 86 73 78  Temp: 98.8 F (37.1 C) 98.4 F (36.9 C) 98.4 F (36.9 C) 97.9 F (36.6 C)  TempSrc: Oral Oral Oral Oral  Resp: '18 18 18 18  ' Height: '5\' 3"'  (1.6 m)     Weight: 118.162 kg (260 lb 8 oz)  116.574 kg (257 lb)   SpO2: 98% 99% 95% 98%   Weight change:   Intake/Output Summary (Last 24 hours) at 07/05/15 1059 Last data filed at 07/05/15 1000  Gross per 24 hour  Intake      0 ml  Output    950 ml  Net   -950 ml   General: resting in bed HEENT: Malar erythema with large neck Cardiac: RRR, no rubs, murmurs or gallops Pulm: clear to auscultation bilaterally, moving normal volumes of air Abd: soft, nontender, obese, BS present Ext: right second toe erythematous with distal necrosis, unchanged from yesterday  Lab Results: Basic Metabolic Panel:  Recent Labs Lab 07/04/15 1224 07/05/15 0332  NA 134* 136  K 4.2 4.3  CL 96* 95*  CO2 27 28  GLUCOSE 94 96  BUN 8 12  CREATININE 1.39* 1.17*  CALCIUM 9.3 9.7   Liver Function Tests:  Recent Labs Lab 07/05/15 0332  AST 38  ALT 23  ALKPHOS 130*  BILITOT 0.4  PROT 7.2  ALBUMIN 3.7   CBC:  Recent Labs Lab 07/04/15 1224 07/05/15 0332  WBC 8.6 9.2  HGB 10.1* 9.6*  HCT 31.3* 29.6*  MCV 91.8 91.4  PLT 289 284   Hemoglobin A1C:  Recent Labs Lab 07/04/15 1801  HGBA1C 6.0*   Thyroid Function Tests:  Recent Labs Lab 07/04/15 1801 07/04/15 2116  TSH 10.433*  --   FREET4  --  0.73   Studies/Results:  Dg Foot 2  Views Right  07/05/2015   CLINICAL DATA:  Generalized right foot pain and swelling  EXAM: RIGHT FOOT - 2 VIEW  COMPARISON:  None.  FINDINGS: There are changes consistent with prior amputation of the first toe. Deformities of the second through fifth metatarsals are seen. Postsurgical changes are noted in the distal first metatarsal. Generalized soft tissue swelling is noted. No bony erosion to suggest osteomyelitis is noted.  IMPRESSION: Soft tissue swelling.  Chronic bony changes without acute abnormality.   Electronically Signed   By: Inez Catalina M.D.   On: 07/05/2015 10:49   Medications: I have reviewed the patient's current medications. Scheduled Meds: . aspirin  81 mg Oral Daily  . enoxaparin (LOVENOX) injection  40 mg Subcutaneous Q24H  . furosemide  40 mg Intravenous Once  . insulin aspart  0-5 Units Subcutaneous QHS  . insulin aspart  0-9 Units Subcutaneous TID WC  . pregabalin  150 mg Oral QHS  . [START ON 07/06/2015] pregabalin  150 mg Oral q morning - 10a  . sodium chloride  3 mL Intravenous Q12H  . sodium chloride  3 mL Intravenous Q12H   Continuous  Infusions:  PRN Meds:.oxyCODONE-acetaminophen, promethazine  Assessment/Plan:  Dyspnea on exertion: Per H&P, my differential includes obstructive sleep apnea, obesity hypoventilation syndrome, less likely right-sided heart failure, and even less likely COPD. Regarding her right-sided evaluate right-sided heart failure, she does not have an overtly loud S2 but this is difficult to assess given her obesity. I will order a transthoracic echocardiogram to evaluate for tricuspid regurgitation suggestive for pulmonary hypertension. -Transthoracic echocardiogram today -She will need a sleep study and PFTs as an outpatient to evaluate for obstructive sleep apnea and obesity hypoventilation syndrome, respectively  Lower extremity edema: We will taper her off pregabalin dose down to 132m BID over the course of a week. She was on 204mBID at  home. -Wil give 15073myrica BID today and tomorrow then taper over the course of the week down to 100m5mD -May consider venlafaxine or amitryptiline upon discharge, pending whether her pain is neuropathic or vascular in nature  Lower extremity pain: I question whether she has true diabetic neuropathy or peripheral vascular disease. I suspect the latter because of her history of right toe amputation, distal ischemia on her right second toe, she has no hair on her legs, and I can't palpate her dorsalis pedis pulses. It's tough to say whether she has claudication because she can hardly walk; however if this is true resting pain, her PVD is likely very severe.  I'll get ABIs today to assess. -ABIs today -Started aspirin 81mg4mly -Started oxycodone/acetaminophen 5/325 q4hr PRN. She may require more because she was on very high dose of 30mg 22modone at home which she was taking 3 times per day.  Distal ischemia on right second toe: This is concerning for severe peripheral vascular disease per above. Original x-ray was negative for osteomyelitis which is only slightly reassuring. I'll wait for her ESR and re-address getting her an MRI. -Consider MRI pending ESR  Subclinical hypothyroidisim: Her TSH was elevated to 10.3 with a normal free T4. Per UpToDate, subclinical hypothyroidism with TSH greater than 10 is associated with atherosclerososis and myocardial infarctions. In light of this as well as her obesity, likely peripheral vascular disease, and type 2 diabetes, she is certainly a candidate for T4 replacement. However, her osteomyelitis may be falsely elevating her TSH. We will consider starting her on 25mcg 50m4 and follow-up TSH in 6 weeks PENDING her MRI results. -Will consider starting T4 25mcg a21mollow TSH in 6 weeks pending MRI results  Hypotension: Her hypotension was due to her large arms as we suspected. Now that they have an appropriately-sized cuff, her pressures have been normal. Her  morning cortisol was normal. She should probably go home on the benazepril but may not need  Dispo: Disposition is deferred at this time, awaiting improvement of current medical problems.   The patient does not have a current PCP ([facility]ynthiaOctavio Gravesd [facility].  The patient does have transportation limitations that hinder transportation to clinic appointments.  .Services Needed at time of discharge: Y = Yes, Blank = No PT:   OT:   RN:   Equipment:   Other:       Sheretha Shadd FloLoleta Chance0/2016, 10:59 AM   ADDENDUM 9/20 1239:  ESR elevated to 90 so I will order MRI right foot to evaluate for osteomyelitis. ABIs and TTE are still pending.  Brooklin Rieger FloLoleta Chance

## 2015-07-05 NOTE — Progress Notes (Signed)
Called by MRI- pt unable to lie still. Received order from MD for 1 mg iv Morphine which has been administered in MRI dept.

## 2015-07-05 NOTE — Evaluation (Signed)
Physical Therapy Evaluation Patient Details Name: Erin [name] MRN: [id number] DOB: [birth year] Today's Date: 07/05/2015   History of Present Illness  Patient is a 60 yo female admitted 07/04/15 with increased dyspnea, LE edema, ulcer Rt 2nd toe.  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  Clinical Impression  Patient presents with problems listed below.  Will benefit from acute PT to maximize functional independence prior to discharge home with husband.  Patient with decreased balance during gait due to neuropathy (present pta).  Recommend use of cane for balance/safety.    Follow Up Recommendations No PT follow up;Supervision - Intermittent    Equipment Recommendations  Cane; 3in1 (PT)    Recommendations for Other Services       Precautions / Restrictions Precautions Precautions: Fall Restrictions Weight Bearing Restrictions: No      Mobility  Bed Mobility Overal bed mobility: Independent                Transfers Overall transfer level: Independent Equipment used: None                Ambulation/Gait Ambulation/Gait assistance: Min guard Ambulation Distance (Feet): 40 Feet Assistive device: None Gait Pattern/deviations: Step-through pattern;Decreased step length - right;Decreased step length - left;Decreased stride length;Shuffle Gait velocity: Decreased Gait velocity interpretation: Below normal speed for age/gender General Gait Details: Patient with unsteady gait, reaching for furniture/walls for support.  Stairs            Wheelchair Mobility    Modified Rankin (Stroke Patients Only)       Balance Overall balance assessment: Needs assistance         Standing balance support: No upper extremity supported;Single extremity supported;During functional activity Standing balance-Leahy Scale: Poor Standing balance comment: Static balance fair in standing.  Requires UE support for dynamic  activities/ambulation.                             Pertinent Vitals/Pain Pain Assessment: 0-10 Pain Score: 10-Worst pain ever Pain Location: Feet and lower legs from neuropathy Pain Descriptors / Indicators: Pins and needles;Constant;Sharp;Sore Pain Intervention(s): Limited activity within patient's tolerance;Repositioned    Home Living Family/patient expects to be discharged to:: Private residence Living Arrangements: Spouse/significant other;Children Available Help at Discharge: Family;Available PRN/intermittently (Husband works) Type of Home: House Home Access: Level entry     Home Layout: Two level;Able to live on main level with bedroom/bathroom Home Equipment: None      Prior Function Level of Independence: Independent         Comments: Has been furniture walking due to decreased balance.     Hand Dominance        Extremity/Trunk Assessment   Upper Extremity Assessment: Overall WFL for tasks assessed           Lower Extremity Assessment: Generalized weakness (Numbness/pain feet and lower legs - neuropathy)      Cervical / Trunk Assessment: Normal  Communication   Communication: No difficulties  Cognition Arousal/Alertness: Awake/alert Behavior During Therapy: WFL for tasks assessed/performed Overall Cognitive Status: Within Functional Limits for tasks assessed                      General Comments      Exercises        Assessment/Plan    PT Assessment Patient needs continued PT services  PT Diagnosis Difficulty walking;Abnormality of gait;Generalized weakness;Acute pain   PT  Problem List Decreased strength;Decreased activity tolerance;Decreased balance;Decreased mobility;Decreased knowledge of use of DME;Impaired sensation;Pain  PT Treatment Interventions DME instruction;Gait training;Functional mobility training;Therapeutic activities;Therapeutic exercise;Patient/family education   PT Goals (Current goals can be found  in the Care Plan section) Acute Rehab PT Goals Patient Stated Goal: To decrease pain. PT Goal Formulation: With patient Time For Goal Achievement: 07/12/15 Potential to Achieve Goals: Good    Frequency Min 3X/week   Barriers to discharge        Co-evaluation               End of Session Equipment Utilized During Treatment: Gait belt Activity Tolerance: Patient limited by pain Patient left: in bed;with call bell/phone within reach;with family/visitor present (sitting EOB) Nurse Communication: Mobility status (Equipment needs at d/c)    Functional Assessment Tool Used: Clinical judgement Functional Limitation: Mobility: Walking and moving around Mobility: Walking and Moving Around Current Status [phone]): At least 1 percent but less than 20 percent impaired, limited or restricted Mobility: Walking and Moving Around Goal Status [phone]): At least 1 percent but less than 20 percent impaired, limited or restricted    Time: 0931-0944 PT Time Calculation (min) (ACUTE ONLY): 13 min   Charges:   PT Evaluation $Initial PT Evaluation Tier I: 1 Procedure     PT G Codes:   PT G-Codes **NOT FOR INPATIENT CLASS** Functional Assessment Tool Used: Clinical judgement Functional Limitation: Mobility: Walking and moving around Mobility: Walking and Moving Around Current Status ([id number]): At least 1 percent but less than 20 percent impaired, limited or restricted Mobility: Walking and Moving Around Goal Status [phone]): At least 1 percent but less than 20 percent impaired, limited or restricted    Vena Austria 07/05/2015, 11:07 AM Durenda Hurt. Renaldo Fiddler, [facility] Acute Rehab Services Pager [phone]

## 2015-07-05 NOTE — Progress Notes (Signed)
OT Cancellation Note and Discharge  Patient Details Name: CAY KATH MRN: [id number] DOB: 1955-12-02   Cancelled Treatment:    Reason Eval/Treat Not Completed: OT screened, no needs identified, will sign off. Pt reports no issues with BADLs currently and if she does she has her husband and 3 daughters that can A her.  Evette Georges [phone] 07/05/2015, 3:19 PM

## 2015-07-06 ENCOUNTER — Observation Stay (HOSPITAL_COMMUNITY): Payer: BLUE CROSS/BLUE SHIELD

## 2015-07-06 DIAGNOSIS — M79671 Pain in right foot: Secondary | ICD-10-CM

## 2015-07-06 DIAGNOSIS — R600 Localized edema: Secondary | ICD-10-CM

## 2015-07-06 DIAGNOSIS — M869 Osteomyelitis, unspecified: Secondary | ICD-10-CM

## 2015-07-06 DIAGNOSIS — M7989 Other specified soft tissue disorders: Secondary | ICD-10-CM | POA: Diagnosis not present

## 2015-07-06 DIAGNOSIS — E038 Other specified hypothyroidism: Secondary | ICD-10-CM

## 2015-07-06 DIAGNOSIS — I10 Essential (primary) hypertension: Secondary | ICD-10-CM

## 2015-07-06 DIAGNOSIS — E119 Type 2 diabetes mellitus without complications: Secondary | ICD-10-CM | POA: Diagnosis not present

## 2015-07-06 DIAGNOSIS — E1142 Type 2 diabetes mellitus with diabetic polyneuropathy: Secondary | ICD-10-CM | POA: Diagnosis not present

## 2015-07-06 DIAGNOSIS — R0609 Other forms of dyspnea: Secondary | ICD-10-CM | POA: Diagnosis not present

## 2015-07-06 DIAGNOSIS — L97509 Non-pressure chronic ulcer of other part of unspecified foot with unspecified severity: Secondary | ICD-10-CM

## 2015-07-06 DIAGNOSIS — M797 Fibromyalgia: Secondary | ICD-10-CM

## 2015-07-06 DIAGNOSIS — R0600 Dyspnea, unspecified: Secondary | ICD-10-CM

## 2015-07-06 LAB — GLUCOSE, CAPILLARY
GLUCOSE-CAPILLARY: 110 mg/dL — AB (ref 65–99)
GLUCOSE-CAPILLARY: 138 mg/dL — AB (ref 65–99)
GLUCOSE-CAPILLARY: 104 mg/dL — AB (ref 65–99)
Glucose-Capillary: 109 mg/dL — ABNORMAL HIGH (ref 65–99)

## 2015-07-06 MED ORDER — ACETAMINOPHEN 325 MG PO TABS
650.0000 mg | ORAL_TABLET | Freq: Once | ORAL | Status: AC
Start: 2015-07-06 — End: 2015-07-06
  Administered 2015-07-06: 650 mg via ORAL
  Filled 2015-07-06: qty 2

## 2015-07-06 MED ORDER — GADOBENATE DIMEGLUMINE 529 MG/ML IV SOLN
20.0000 mL | Freq: Once | INTRAVENOUS | Status: AC | PRN
  Administered 2015-07-06: 20 mL via INTRAVENOUS

## 2015-07-06 MED ORDER — PERFLUTREN LIPID MICROSPHERE
1.0000 mL | INTRAVENOUS | Status: AC | PRN
  Administered 2015-07-06: 2 mL via INTRAVENOUS
  Filled 2015-07-06: qty 10

## 2015-07-06 NOTE — Progress Notes (Signed)
Physical Therapy Treatment Patient Details Name: Erin [name] MRN: [id number] DOB: 09-Feb-1956 Today's Date: August 04, 2015    History of Present Illness Patient is a 60 yo female admitted 07/04/15 with increased dyspnea, LE edema, ulcer Rt 2nd toe.  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    PT Comments    Patient making slow gains with mobility and gait.  Pain continues to limit mobility.  Follow Up Recommendations  No PT follow up;Supervision - Intermittent     Equipment Recommendations  Cane;3in1 (PT)    Recommendations for Other Services       Precautions / Restrictions Precautions Precautions: Fall Restrictions Weight Bearing Restrictions: No    Mobility  Bed Mobility               General bed mobility comments: Patient sitting EOB as PT entered room.  Transfers Overall transfer level: Independent Equipment used: None             General transfer comment: Patient declined use of RW today.  Ambulation/Gait Ambulation/Gait assistance: Min guard Ambulation Distance (Feet): 70 Feet Assistive device: None Gait Pattern/deviations: Step-through pattern;Decreased step length - right;Decreased step length - left;Decreased stride length;Shuffle Gait velocity: Decreased Gait velocity interpretation: Below normal speed for age/gender General Gait Details: Patient declined use of RW today.  Patient with unsteady, slow guarded gait.  Continues to reach for furniture/walls for support.   Stairs            Wheelchair Mobility    Modified Rankin (Stroke Patients Only)       Balance           Standing balance support: No upper extremity supported;During functional activity Standing balance-Leahy Scale: Poor Standing balance comment: Static balance fair in standing.  Requires UE support for dynamic activities/ambulation.                    Cognition Arousal/Alertness: Awake/alert Behavior During  Therapy: WFL for tasks assessed/performed Overall Cognitive Status: Within Functional Limits for tasks assessed                      Exercises      General Comments        Pertinent Vitals/Pain Pain Assessment: 0-10 Pain Score: 6  Pain Location: Bil feet Pain Descriptors / Indicators: Aching;Sore;Pins and needles Pain Intervention(s): Limited activity within patient's tolerance;Repositioned;Premedicated before session    Home Living                      Prior Function            PT Goals (current goals can now be found in the care plan section) Progress towards PT goals: Progressing toward goals    Frequency  Min 3X/week    PT Plan Current plan remains appropriate    Co-evaluation             End of Session Equipment Utilized During Treatment: Gait belt Activity Tolerance: Patient limited by pain Patient left: in bed;with call bell/phone within reach;with family/visitor present (sitting EOB)     Time: 0865-7846 PT Time Calculation (min) (ACUTE ONLY): 11 min  Charges:  $Gait Training: 8-22 mins                    G Codes:      Vena Austria 08-04-15, 5:39 PM Durenda Hurt. Renaldo Fiddler, [facility] Acute Rehab Services Pager [phone]

## 2015-07-06 NOTE — Progress Notes (Signed)
  Echocardiogram 2D Echocardiogram with Definity has been performed.  Erin [name] 07/06/2015, 3:28 PM

## 2015-07-06 NOTE — Progress Notes (Signed)
VASCULAR LAB PRELIMINARY  ARTERIAL  ABI completed:ABIs indicate normal arterial flow to bilateral lower extremities.     RIGHT    LEFT    PRESSURE WAVEFORM  PRESSURE WAVEFORM  BRACHIAL 121 T BRACHIAL 129 T  DP 130 T DP 141 T  AT   AT    PT 130 T PT 134 T  PER   PER    GREAT TOE  NA GREAT TOE  NA    RIGHT LEFT  ABI 1.01 1.09     Chrisanne Loose, RVT 07/06/2015, 8:56 AM

## 2015-07-06 NOTE — Progress Notes (Signed)
Patient ID: Erin [name], female   DOB: 01-26-1956, 59 y.o.   MRN: [id number]   Subjective: Erin [name] was seen and examined this afternoon.  She has gotten ABI, MRI.  TTE just completed.  She is feeling ok and says her breathing is better.   Objective: Vital signs in last 24 hours: Filed Vitals:   07/05/15 1300 07/05/15 2134 07/06/15 0126 07/06/15 0501  BP: 121/69 125/58 115/61 139/70  Pulse: 83 89 71 80  Temp: 98.1 F (36.7 C) 98.3 F (36.8 C) 98.2 F (36.8 C) 98 F (36.7 C)  TempSrc: Oral Oral Oral Oral  Resp: 18 20 18 18   Height:      Weight:    249 lb (112.946 kg)  SpO2: 96% 95% 95% 97%   Weight change: -11 lb 8 oz (-5.216 kg)  Intake/Output Summary (Last 24 hours) at 07/06/15 0724 Last data filed at 07/06/15 0600  Gross per 24 hour  Intake    720 ml  Output   2900 ml  Net  -2180 ml   General: resting in bed HEENT: [place]/AT, large neck Cardiac: RRR, no rubs, murmurs or gallops Pulm: clear to auscultation bilaterally, moving normal volumes of air Abd: soft, nontender, obese, BS present Ext: right second toe erythematous with distal necrosis, unchanged from yesterday; heels are wrapped; I am able to appreciate DPs (R > L) today.  Lab Results: Basic Metabolic Panel:  Recent Labs Lab 07/04/15 1224 07/05/15 0332  NA 134* 136  K 4.2 4.3  CL 96* 95*  CO2 27 28  GLUCOSE 94 96  BUN 8 12  CREATININE 1.39* 1.17*  CALCIUM 9.3 9.7   Liver Function Tests:  Recent Labs Lab 07/05/15 0332  AST 38  ALT 23  ALKPHOS 130*  BILITOT 0.4  PROT 7.2  ALBUMIN 3.7   CBC:  Recent Labs Lab 07/04/15 1224 07/05/15 0332  WBC 8.6 9.2  HGB 10.1* 9.6*  HCT 31.3* 29.6*  MCV 91.8 91.4  PLT 289 284   Hemoglobin A1C:  Recent Labs Lab 07/04/15 1801  HGBA1C 6.0*   Thyroid Function Tests:  Recent Labs Lab 07/04/15 1801 07/04/15 2116  TSH 10.433*  --   FREET4  --  0.73   Studies/Results:  Dg Foot 2 Views Right  07/05/2015   CLINICAL DATA:  Generalized right  foot pain and swelling  EXAM: RIGHT FOOT - 2 VIEW  COMPARISON:  None.  FINDINGS: There are changes consistent with prior amputation of the first toe. Deformities of the second through fifth metatarsals are seen. Postsurgical changes are noted in the distal first metatarsal. Generalized soft tissue swelling is noted. No bony erosion to suggest osteomyelitis is noted.  IMPRESSION: Soft tissue swelling.  Chronic bony changes without acute abnormality.   Electronically Signed   By: Alcide Clever M.D.   On: 07/05/2015 10:49    Medications: I have reviewed the patient's current medications. Scheduled Meds: . aspirin  81 mg Oral Daily  . enoxaparin (LOVENOX) injection  40 mg Subcutaneous Q24H  . furosemide  40 mg Intravenous Once  . insulin aspart  0-5 Units Subcutaneous QHS  . insulin aspart  0-9 Units Subcutaneous TID WC  . LORazepam  1 mg Oral to XRAY  . pregabalin  150 mg Oral QHS  . pregabalin  150 mg Oral q morning - 10a  . sodium chloride  3 mL Intravenous Q12H  . sodium chloride  3 mL Intravenous Q12H   Continuous Infusions:  PRN Meds:.gadobenate dimeglumine, oxyCODONE-acetaminophen,  promethazine  Assessment/Plan:  Osteomyelitis: MRI concerning for osteo of little toe, second and third toes.  She has hx of right great toe amputation for osteomyelitis several years ago. - consult to ortho and appreciate their recommendations and interventions; ortho will see in AM - she is hemodynamically stable, will hold off on antibiotics until after culture in OR  Dyspnea on exertion: Likely due to OSA/OHS.  Awaiting 2D ECHO results for PA pressure, EF, valvular function.  She feels she is improving. - supplemental oxygen prn - follow-up 2D ECHO result - outpatient sleep study  Lower extremity edema: We will taper her off pregabalin dose down to  BID over the course of a week. She was on  BID at home. -  Lyrica BID today then taper over the course of the week down to  BID -May  consider venlafaxine or amitryptiline upon discharge, pending whether her pain is neuropathic or vascular in nature  Lower extremity pain: she describes components of both DM neuropathy and PVD.  ABIs show good flow and I can palpate pulses today. -continue aspirin  daily -continue oxycodone/acetaminophen 5/325 q4hr PRN  Subclinical hypothyroidisim: Her TSH was elevated to 10.3 with a normal free T4 and T3 just below normal.  It may be reasonable to treat vs await treatment of osteo (?sick euthyroid). -will consider starting T4 and follow TSH in 6 weeks  Hypotension, resolved: no evidence of adrenal insufficiency.   BP on the floor have been normal and I suspect low pressure in the ED were due to inappropriate cuff size.  Dispo: Disposition is deferred at this time, awaiting improvement of current medical problems.   The patient does not have a current PCP Samuel Jester, DO) and does need an [facility] hospital follow-up appointment after discharge.  The patient does have transportation limitations that hinder transportation to clinic appointments.  .Services Needed at time of discharge: Y = Yes, Blank = No PT:   OT:   RN:   Equipment:   Other:       [name], DO 07/06/2015, 7:24 AM

## 2015-07-07 DIAGNOSIS — I739 Peripheral vascular disease, unspecified: Secondary | ICD-10-CM | POA: Diagnosis not present

## 2015-07-07 DIAGNOSIS — L98499 Non-pressure chronic ulcer of skin of other sites with unspecified severity: Secondary | ICD-10-CM

## 2015-07-07 DIAGNOSIS — E1140 Type 2 diabetes mellitus with diabetic neuropathy, unspecified: Secondary | ICD-10-CM | POA: Diagnosis not present

## 2015-07-07 DIAGNOSIS — L97519 Non-pressure chronic ulcer of other part of right foot with unspecified severity: Secondary | ICD-10-CM | POA: Diagnosis not present

## 2015-07-07 DIAGNOSIS — I70209 Unspecified atherosclerosis of native arteries of extremities, unspecified extremity: Secondary | ICD-10-CM

## 2015-07-07 DIAGNOSIS — E1142 Type 2 diabetes mellitus with diabetic polyneuropathy: Secondary | ICD-10-CM | POA: Diagnosis not present

## 2015-07-07 DIAGNOSIS — E039 Hypothyroidism, unspecified: Secondary | ICD-10-CM

## 2015-07-07 DIAGNOSIS — R0600 Dyspnea, unspecified: Secondary | ICD-10-CM

## 2015-07-07 LAB — GLUCOSE, CAPILLARY: Glucose-Capillary: 124 mg/dL — ABNORMAL HIGH (ref 65–99)

## 2015-07-07 MED ORDER — PREGABALIN 100 MG PO CAPS: 100.0000 mg | ORAL_CAPSULE | Freq: Three times a day (TID) | ORAL | Status: DC

## 2015-07-07 MED ORDER — PREGABALIN 50 MG PO CAPS: 50.0000 mg | ORAL_CAPSULE | Freq: Three times a day (TID) | ORAL | Status: DC

## 2015-07-07 MED ORDER — ASPIRIN 81 MG PO CHEW: 81.0000 mg | CHEWABLE_TABLET | Freq: Every day | ORAL | Status: DC

## 2015-07-07 MED ORDER — OXYCODONE-ACETAMINOPHEN 5-325 MG PO TABS
1.0000 | ORAL_TABLET | Freq: Once | ORAL | Status: AC
  Administered 2015-07-07: 1 via ORAL
  Filled 2015-07-07: qty 1

## 2015-07-07 MED ORDER — VENLAFAXINE HCL ER 150 MG PO CP24: 150.0000 mg | ORAL_CAPSULE | Freq: Every day | ORAL | Status: DC

## 2015-07-07 NOTE — Progress Notes (Signed)
  Date: 07/07/2015  Patient name: Erin [name]  Medical record number: [id number]  Date of birth: September 03, 1956   This patient has been seen and the plan of care was discussed with the house staff. Please see their note for complete details. I concur with their findings with the following additions/corrections: I saw Ms Dillavou this AM with Drs Earnest Conroy and Emopkae. She is feeling well and excited to hear she doesn't need surgery on her foot. Dr Earnest Conroy has laid out DM neuropathy tx plan in his note. She is stable for D/C home today.  Burns Spain, MD 07/07/2015, 10:23 AM

## 2015-07-07 NOTE — Consult Note (Signed)
Reason for Consult: Osteomyelitis right foot fifth toe Referring Physician: [name] is an 59 y.o. female.  HPI: Patient is a 59 year old woman who states she presented to the hospital for possible congestive heart failure. Patient has had a abrasion to the second toe right foot and status post great toe amputation and radiographs and MRI scan were obtained to further evaluate the right foot.  Past Medical History  Diagnosis Date  . Diabetes mellitus   . Hypertension   . Fibromyalgia   . Peripheral neuropathy   . Hernia   . Depression   . GERD (gastroesophageal reflux disease)   . Chronic pain   . Miscarriage     twins  . Carpal tunnel syndrome   . TMJ syndrome     Past Surgical History  Procedure Laterality Date  . Tonsillectomy    . Abdominal surgery      Family History  Problem Relation Age of Onset  . Aneurysm Father     Deceased, 51  . CAD Father   . Heart disease Father   . Cancer Father   . Pancreatic cancer Mother     Deceased, 98    Social History:  reports that she has quit smoking. She does not have any smokeless tobacco history on file. She reports that she does not drink alcohol or use illicit drugs.  Allergies: No Known Allergies  Medications: I have reviewed the patient's current medications.  Results for orders placed or performed during the hospital encounter of 07/04/15 (from the past 48 hour(s))  Sedimentation rate     Status: Abnormal   Collection Time: 07/05/15 10:48 AM  Result Value Ref Range   Sed Rate 90 (H) 0 - 22 mm/hr  Glucose, capillary     Status: Abnormal   Collection Time: 07/05/15 11:31 AM  Result Value Ref Range   Glucose-Capillary 120 (H) 65 - 99 mg/dL   Comment 1 Notify RN    Comment 2 Document in Chart   Glucose, capillary     Status: None   Collection Time: 07/05/15  5:06 PM  Result Value Ref Range   Glucose-Capillary 99 65 - 99 mg/dL   Comment 1 Notify RN    Comment 2 Document in Chart   Glucose,  capillary     Status: Abnormal   Collection Time: 07/05/15  9:49 PM  Result Value Ref Range   Glucose-Capillary 100 (H) 65 - 99 mg/dL   Comment 1 Notify RN    Comment 2 Document in Chart   Glucose, capillary     Status: Abnormal   Collection Time: 07/06/15  6:08 AM  Result Value Ref Range   Glucose-Capillary 110 (H) 65 - 99 mg/dL  Glucose, capillary     Status: Abnormal   Collection Time: 07/06/15 11:37 AM  Result Value Ref Range   Glucose-Capillary 109 (H) 65 - 99 mg/dL   Comment 1 Notify RN    Comment 2 Document in Chart   Glucose, capillary     Status: Abnormal   Collection Time: 07/06/15  5:04 PM  Result Value Ref Range   Glucose-Capillary 138 (H) 65 - 99 mg/dL   Comment 1 Notify RN    Comment 2 Document in Chart   Glucose, capillary     Status: Abnormal   Collection Time: 07/06/15 10:01 PM  Result Value Ref Range   Glucose-Capillary 104 (H) 65 - 99 mg/dL  Glucose, capillary     Status: Abnormal  Collection Time: 07/07/15  5:29 AM  Result Value Ref Range   Glucose-Capillary 124 (H) 65 - 99 mg/dL    Mr Foot Right W Wo Contrast  07/06/2015   CLINICAL DATA:  Diabetic patient with a sore spot on the right second toe. The right second toe appears dusky. History of prior great toe amputation.  EXAM: MRI OF THE RIGHT FOREFOOT WITHOUT AND WITH CONTRAST  TECHNIQUE: Multiplanar, multisequence MR imaging was performed both before and after administration of intravenous contrast.  CONTRAST:  20 mL MULTIHANCE GADOBENATE DIMEGLUMINE 529 MG/ML IV SOLN  COMPARISON:  Plain films of the right foot 07/05/2015.  FINDINGS: There is edema and postcontrast enhancement in the middle and distal phalanges of the little toe most consistent osteomyelitis. Edema and enhancement are also seen in the distal phalanx of the second toe and in the distal 0.9 cm of the proximal phalanx of the second toe. Finally, edema and enhancement are seen in the distal 0.7 cm of the proximal phalanx of the third toe.   Subchondral cyst formation is seen about the tarsometatarsal joints, most notable about the fourth and fifth, consistent with degenerative disease. The patient is status post amputation of the great toe. Remote diaphyseal fractures of the second, third and fourth metatarsals are identified. The second metatarsal fracture may be a nonunion. No soft tissue abscess is identified. No joint effusion is seen. Soft tissues over the dorsum of the foot demonstrate mild edema.  IMPRESSION: Intense edema and postcontrast enhancement in the proximal middle phalanges of the little toe most consistent with osteomyelitis.  Edema throughout the distal phalanx of the second toe and head of the proximal phalanx of the second toe and head of the proximal phalanx of the third toe is also worrisome for osteomyelitis. Signal change appears more intense in the second toe.  Negative for abscess.  Remote second, third and fourth metatarsal fractures.  Status post great toe amputation.   Electronically Signed   By: Drusilla Kanner M.D.   On: 07/06/2015 12:16   Dg Foot 2 Views Right  07/05/2015   CLINICAL DATA:  Generalized right foot pain and swelling  EXAM: RIGHT FOOT - 2 VIEW  COMPARISON:  None.  FINDINGS: There are changes consistent with prior amputation of the first toe. Deformities of the second through fifth metatarsals are seen. Postsurgical changes are noted in the distal first metatarsal. Generalized soft tissue swelling is noted. No bony erosion to suggest osteomyelitis is noted.  IMPRESSION: Soft tissue swelling.  Chronic bony changes without acute abnormality.   Electronically Signed   By: Alcide Clever M.D.   On: 07/05/2015 10:49    Review of Systems  All other systems reviewed and are negative.  Blood pressure 124/70, pulse 72, temperature 98 F (36.7 C), temperature source Oral, resp. rate 16, height 5\' 3"  (1.6 m), weight 111.3 kg (245 lb 6 oz), SpO2 100 %. Physical Exam On examination patient has a palpable  dorsalis pedis pulse there is no redness no cellulitis no swelling of the right foot. She does have some small abrasions to the second toe but no swelling. The remainder of her toes have normal size and color. The little toe has no redness no swelling no ulcerations no clinical signs of infection. Review the radiographs shows previous metatarsal fractures consistent with Charcot arthropathy patient is also status post amputation of great toe. There is no destructive bony changes of the toes. Review of the MRI scan does show some increased uptake of  the middle phalanx of the right little toe. Patient does have some cracked heel ulcers but no signs of infection in either heel. Assessment/Plan: Assessment: Right foot Charcot arthropathy status post great toe amputation with no clinical signs of infection but an MRI scan that does show increased uptake middle phalanx of the right little toe.  Plan: I will follow-up in the office after discharge. No indications for surgical intervention at this time. No clinical signs of infection of the little toe. We'll follow the little toe conservatively. Do not feel that she needs any antibiotics.  Erin [name] V 07/07/2015, 6:45 AM

## 2015-07-07 NOTE — Progress Notes (Signed)
Patient ID: Erin [name], female   DOB: [birth year], 59 y.o.   MRN: [id number]   Subjective: [name] slept well last night and said she was ready to go home. She spoke to Dr. Lajoyce Corners this morning who told her he does not think she has osteomyelitis and can follow with her. She was eager to get a sleep study done and get a CPAP like her husband. She has no complaints this morning and says she's ready to go home.  Objective: Vital signs in last 24 hours: Filed Vitals:   07/06/15 0501 07/06/15 1430 07/06/15 2203 07/07/15 0534  BP: 139/70 129/58 120/60 124/70  Pulse: 80 83 72 72  Temp: 98 F (36.7 C) 98.7 F (37.1 C) 98.6 F (37 C) 98 F (36.7 C)  TempSrc: Oral Oral Oral Oral  Resp: 18 20 18 16   Height:      Weight: 112.946 kg (249 lb)   111.3 kg (245 lb 6 oz)  SpO2: 97% 99% 100% 100%   General: resting in bed Cardiac: RRR, no rubs, murmurs or gallops Pulm: clear to auscultation anteriorly Ext: warm and well perfused, right second toe with distal ulceration, unchanged from yesterday  Studies/Results: Mr Foot Right W Wo Contrast  07/06/2015   CLINICAL DATA:  Diabetic patient with a sore spot on the right second toe. The right second toe appears dusky. History of prior great toe amputation.  EXAM: MRI OF THE RIGHT FOREFOOT WITHOUT AND WITH CONTRAST  TECHNIQUE: Multiplanar, multisequence MR imaging was performed both before and after administration of intravenous contrast.  CONTRAST:  20 mL MULTIHANCE GADOBENATE DIMEGLUMINE 529 MG/ML IV SOLN  COMPARISON:  Plain films of the right foot 07/05/2015.  FINDINGS: There is edema and postcontrast enhancement in the middle and distal phalanges of the little toe most consistent osteomyelitis. Edema and enhancement are also seen in the distal phalanx of the second toe and in the distal 0.9 cm of the proximal phalanx of the second toe. Finally, edema and enhancement are seen in the distal 0.7 cm of the proximal phalanx of the third toe.  Subchondral  cyst formation is seen about the tarsometatarsal joints, most notable about the fourth and fifth, consistent with degenerative disease. The patient is status post amputation of the great toe. Remote diaphyseal fractures of the second, third and fourth metatarsals are identified. The second metatarsal fracture may be a nonunion. No soft tissue abscess is identified. No joint effusion is seen. Soft tissues over the dorsum of the foot demonstrate mild edema.  IMPRESSION: Intense edema and postcontrast enhancement in the proximal middle phalanges of the little toe most consistent with osteomyelitis.  Edema throughout the distal phalanx of the second toe and head of the proximal phalanx of the second toe and head of the proximal phalanx of the third toe is also worrisome for osteomyelitis. Signal change appears more intense in the second toe.  Negative for abscess.  Remote second, third and fourth metatarsal fractures.  Status post great toe amputation.   Electronically Signed   By: Drusilla Kanner M.D.   On: 07/06/2015 12:16   Dg Foot 2 Views Right  07/05/2015   CLINICAL DATA:  Generalized right foot pain and swelling  EXAM: RIGHT FOOT - 2 VIEW  COMPARISON:  None.  FINDINGS: There are changes consistent with prior amputation of the first toe. Deformities of the second through fifth metatarsals are seen. Postsurgical changes are noted in the distal first metatarsal. Generalized soft tissue swelling is noted. No  bony erosion to suggest osteomyelitis is noted.  IMPRESSION: Soft tissue swelling.  Chronic bony changes without acute abnormality.   Electronically Signed   By: Alcide Clever M.D.   On: 07/05/2015 10:49   Medications: I have reviewed the patient's current medications. Scheduled Meds: . aspirin  81 mg Oral Daily  . enoxaparin (LOVENOX) injection  40 mg Subcutaneous Q24H  . furosemide  40 mg Intravenous Once  . insulin aspart  0-5 Units Subcutaneous QHS  . insulin aspart  0-9 Units Subcutaneous TID WC    . pregabalin  150 mg Oral QHS  . pregabalin  150 mg Oral q morning - 10a  . sodium chloride  3 mL Intravenous Q12H  . sodium chloride  3 mL Intravenous Q12H   Continuous Infusions:  PRN Meds:.oxyCODONE-acetaminophen, promethazine   Assessment/Plan: Right toe ulcer: MRI concerning for osteo of little toe, second and third toes, but Dr. Lajoyce Corners does not believe she has osteomyelitis and recommended outpatient follow-up without surgery or antibiotics. She has hx of right great toe amputation for osteomyelitis several years ago. ABIs were normal but TBIs in 2013 showed severe disease (0.3)  Dyspnea on exertion: Likely due to OSA/OHS. TTE was normal, showed no tricuspid regurgitation or issues suggestive for right-sided strain. - outpatient sleep study  Lower extremity edema: We will taper her off pregabalin dose down to  TID over the course of a week. She was on  BID at home. -Will discharge with  TID for a week then  TID indefinitely.  Lower extremity pain: she describes components of both DM neuropathy and PVD. ABIs show good flow and I can palpate pulses today. -continue aspirin  daily -continue oxycodone/acetaminophen 5/325 q4hr PRN -will discharge home with venlafaxine  Subclinical hypothyroidisim: Her TSH was elevated to 10.3 with a normal free T4 and T3 just below normal. It may be reasonable to treat vs await treatment of osteo (?sick euthyroid). -consider obtaining TSH in 6 weeks and starting T4  Hypotension, resolved: no evidence of adrenal insufficiency. on the floor have been normal and I suspect low pressure in the ED were due to inappropriate cuff size.BP   Dispo: Discharge home today.  The patient does have a current PCP Samuel Jester, DO) and does need an [facility] hospital follow-up appointment after discharge.  The patient does have transportation limitations that hinder transportation to clinic appointments.  .Services Needed at time of  discharge: Y = Yes, Blank = No PT:   OT:   RN:   Equipment:   Other:       Selina Cooley, MD 07/07/2015, 9:26 AM

## 2015-07-07 NOTE — Discharge Summary (Signed)
Name: Erin [name] MRN: [id number] DOB: September 20, 1956 59 y.o. PCP: Octavio Graves, DO  Date of Admission: 07/04/2015 12:38 PM Date of Discharge: 07/07/2015 Attending Physician: Axel Filler, MD  Discharge Diagnosis: 1. Diabetic neuropathy and peripheral vascular disease 2. Distal ulcer on right second toe 3. Dyspnea on exertion 4. Subclinical hypothyroidism  Discharge Medications:   Medication List    STOP taking these medications        ibuprofen 200 MG tablet  Commonly known as:  ADVIL,MOTRIN      TAKE these medications        aspirin 81 MG chewable tablet  Chew 1 tablet (81 mg total) by mouth daily.     benazepril-hydrochlorthiazide 20-25 MG per tablet  Commonly known as:  LOTENSIN HCT  Take 1 tablet by mouth daily.     metformin 500 MG (OSM) 24 hr tablet  Commonly known as:  FORTAMET  Take 500 mg by mouth daily with breakfast.     omeprazole 40 MG capsule  Commonly known as:  PRILOSEC  Take 40 mg by mouth daily.     oxycodone 30 MG immediate release tablet  Commonly known as:  ROXICODONE  Take 30 mg by mouth every 4 (four) hours as needed for pain.     pregabalin 100 MG capsule  Commonly known as:  LYRICA  Take 1 capsule (100 mg total) by mouth 3 (three) times daily. For ONE week after discharge, then take 70m three times daily     pregabalin 50 MG capsule  Commonly known as:  LYRICA  Take 1 capsule (50 mg total) by mouth 3 (three) times daily. Start taking the 545mcapsules three times daily AFTER 1 week of 10060mapsules three times daily for diabetic neuropathy     venlafaxine XR 150 MG 24 hr capsule  Commonly known as:  EFFEXOR-XR  Take 1 capsule (150 mg total) by mouth daily with breakfast.        Disposition and follow-up:   [name]  At the hospital follow up visit please address:  1.  Resolution of right second toe distal ulceration, results of sleep  study, diabetic neuropathy  2.  Labs / imaging needed at time of follow-up: TSH  3.  Pending labs/ test needing follow-up: Sleep study  Follow-up Appointments:     Follow-up Information    Follow up with DUDA,MARCUS V, MD In 2 weeks.   Specialty:  Orthopedic Surgery   Contact information:   [address] [phone]    [facility] [place] with [facility] Call in 1 week.   Contact information:   [address] [place] [phone]    Follow up with PATEL, DONIKA, DO. Call in 1 week.   Specialty:  Neurology   Why:  To help manage diabetic neuropathy and arterial insufficiency   Contact information:   [address] [phone]    Discharge Instructions: Discharge Instructions    Call MD for:  persistant nausea and vomiting    Complete by:  As directed      Call MD for:  severe uncontrolled pain    Complete by:  As directed      Call MD for:  temperature >100.4    Complete by:  As directed      Diet - low  sodium heart healthy    Complete by:  As directed      Face-to-face encounter (required for Medicare/Medicaid patients)    Complete by:  As directed   I Loleta Chance certify that this patient is under my care and that I, or a nurse practitioner or physician's assistant working with me, had a face-to-face encounter that meets the physician face-to-face encounter requirements with this patient on 07/07/2015. The encounter with the patient was in whole, or in part for the following medical condition(s) which is the primary reason for home health care (List medical condition): arterial insuffiency, diabetic neuropathy  The encounter with the patient was in whole, or in part, for the following medical condition, which is the primary reason for home health care:  Arterial insufficiency and diabetic ulcer  I certify that, based on my findings, the following services are medically necessary  home health services:   Nursing Physical therapy    Reason for Medically Necessary Home Health Services:   Skilled Nursing- Complex Wound Care Therapy- Therapeutic Exercises to Increase Strength and Endurance    My clinical findings support the need for the above services:  Pain interferes with ambulation/mobility  Further, I certify that my clinical findings support that this patient is homebound due to:   Shortness of Breath with activity Open/draining pressure/stasis ulcer       Home Health    Complete by:  As directed   To provide the following care/treatments:   PT RN       Increase activity slowly    Complete by:  As directed            Consultations: Dr. Sharol Given, orthopedic surgery  Procedures Performed:  Mr Foot Right W Wo Contrast  07/06/2015   CLINICAL DATA:  Diabetic patient with a sore spot on the right second toe. The right second toe appears dusky. History of prior great toe amputation.  EXAM: MRI OF THE RIGHT FOREFOOT WITHOUT AND WITH CONTRAST  TECHNIQUE: Multiplanar, multisequence MR imaging was performed both before and after administration of intravenous contrast.  CONTRAST:  20 mL MULTIHANCE GADOBENATE DIMEGLUMINE 529 MG/ML IV SOLN  COMPARISON:  Plain films of the right foot 07/05/2015.  FINDINGS: There is edema and postcontrast enhancement in the middle and distal phalanges of the little toe most consistent osteomyelitis. Edema and enhancement are also seen in the distal phalanx of the second toe and in the distal 0.9 cm of the proximal phalanx of the second toe. Finally, edema and enhancement are seen in the distal 0.7 cm of the proximal phalanx of the third toe.  Subchondral cyst formation is seen about the tarsometatarsal joints, most notable about the fourth and fifth, consistent with degenerative disease. The patient is status post amputation of the great toe. Remote diaphyseal fractures of the second, third and fourth metatarsals are identified. The second metatarsal  fracture may be a nonunion. No soft tissue abscess is identified. No joint effusion is seen. Soft tissues over the dorsum of the foot demonstrate mild edema.  IMPRESSION: Intense edema and postcontrast enhancement in the proximal middle phalanges of the little toe most consistent with osteomyelitis.  Edema throughout the distal phalanx of the second toe and head of the proximal phalanx of the second toe and head of the proximal phalanx of the third toe is also worrisome for osteomyelitis. Signal change appears more intense in the second toe.  Negative for abscess.  Remote second, third and fourth metatarsal fractures.  Status post great toe  amputation.   Electronically Signed   By: Inge Rise M.D.   On: 07/06/2015 12:16   Dg Chest Port 1 View  07/04/2015   CLINICAL DATA:  Shortness breath and wheezing.  EXAM: PORTABLE CHEST - 1 VIEW  COMPARISON:  Two-view chest x-ray 10/02/2011  FINDINGS: The heart size and pulmonary interstitium or exaggerated by low lung volumes. Mild bibasilar atelectasis is present. No other focal airspace disease is present. The visualized soft tissues and bony thorax are unremarkable.  Dental implants are noted.  IMPRESSION: 1. Low lung volumes and mild bibasilar atelectasis.   Electronically Signed   By: San Morelle M.D.   On: 07/04/2015 14:05   Dg Foot 2 Views Right  07/05/2015   CLINICAL DATA:  Generalized right foot pain and swelling  EXAM: RIGHT FOOT - 2 VIEW  COMPARISON:  None.  FINDINGS: There are changes consistent with prior amputation of the first toe. Deformities of the second through fifth metatarsals are seen. Postsurgical changes are noted in the distal first metatarsal. Generalized soft tissue swelling is noted. No bony erosion to suggest osteomyelitis is noted.  IMPRESSION: Soft tissue swelling.  Chronic bony changes without acute abnormality.   Electronically Signed   By: Inez Catalina M.D.   On: 07/05/2015 10:49    2D Echo 07/06/2015: Study  Conclusions  - Left ventricle: The cavity size was normal. Systolic function was vigorous. The estimated ejection fraction was in the range of 65% to 70%. Wall motion was normal; there were no regional wall motion abnormalities. Doppler parameters are consistent with abnormal left ventricular relaxation (grade 1 diastolic dysfunction). There was no evidence of elevated ventricular filling pressure by Doppler parameters. - Aortic valve: Trileaflet; normal thickness leaflets. There was no regurgitation. - Aortic root: The aortic root was normal in size. - Mitral valve: Structurally normal valve. There was mild regurgitation. - Left atrium: The atrium was normal in size. - Right ventricle: Systolic function was normal. - Right atrium: The atrium was normal in size. - Tricuspid valve: There was no regurgitation. - Pulmonic valve: There was no regurgitation. - Pulmonary arteries: Systolic pressure was within the normal range. - Inferior vena cava: The vessel was normal in size. - Pericardium, extracardiac: There was no pericardial effusion.  Impressions:  - Impaired relaxation with normal filling pressures, otherwise normal echocardiogram.  Ankle- brachial indexes 07/06/2015: Brachial pressures:  +--------+-----+----+---+     RightLeftMax +--------+-----+----+---+ Systolic121 270 623 +--------+-----+----+---+  Arterial pressure indices:  +-----------------+---------+--------------+--------+ Location     Pressure Brachial indexWaveform +-----------------+---------+--------------+--------+ Right ant tibial 130 mm Hg1.01     Normal  +-----------------+---------+--------------+--------+ Right post tibial130 mm Hg1.01     Normal  +-----------------+---------+--------------+--------+ Left ant tibial 134 mm Hg1.04     Normal  +-----------------+---------+--------------+--------+ Left post tibial 141 mm  Hg1.09     Normal  +-----------------+---------+--------------+--------+  ------------------------------------------------------------------- Summary: ABIs appear normal with normal waveforms.  Other specific details can be found in the table(s) above. Prepared and Electronically Authenticated by  Gae Gallop MD 2016-08-21T10:23:14  Admission HPI: Erin [name] is an obese 59 year-old Caucasian lady with a history of type II diabetes with severe neuropathy, hypertension, and fibromyalgia, who presents with gradual swelling in her legs over the last month after he neurologist increased her dose of Lyrica to 211m three times daily for her diabetic neuropathy. She is also taking benazepril and 366moxycodone two to three times per day for her chronic pain. Her husband says she can hardly walk around the house and gets  winded even when carrying the laundry basket. She sleeps with one pillow at night and has no problem lying flat. However when she does sleep, she snores very loudly and often wakes up. She tries to nap frequently during the day and has been very tired for years. Since January, she has gained 45 pounds and her family says she looks "swollen." Additionally, she complains of some cracks in the heels of her feet that are painful and bleed on the sheets at night. She also has a sore sport on her right second toe and has had her right first toe amputated years ago. No fevers or chills.  In the emergency department, her blood pressure on the monitor was 70/40 however manual cuff showed 100/60 and her arms are very large. She received 52m IV lasix. Chest x-ray showed low lung volumes without perihilar thickening, bronchial cuffing, or pleural effusions.  Hospital Course by problem list:  1. Diabetic neuropathy and peripheral vascular disease: Ms. BVandezandehas longstanding lower extremity pain at rest that she describes as tingling and sometimes cramping. She has been treated  extensively for diabetic neuropathy; she was on pregabalin 208mtwice daily when she was admitted and this was causing her legs to swell and her bilateral heels to fissure. We suspected there was also peripheral vascular disease given her risk factors, history of toe amputation from osteomyelitis in 2013, and a distal ulcer on her right toe. Ankle-brachial indexes were obtained that were normal; however toe-brachial indexes in 2013 were grossly abnormal. We started her on aspirin 8166maily for clinical suspicion of peripheral vascular disease. We also tapered her dose of pregabalin to 82m80mree times daily and added venlafaxine 182mg68mly. We considered amitryptiline but decided against this given her risk for falls and baseline somnolence from her suspected obstructive sleep apnea/obesity hypoventilation syndrome. She will have home health and physical therapy to help her gradually increase her walking routine.  2. Distal ulcer on right second toe: We suspected this could be osteomyelitis; an X-ray was normal, ESR was elevated to 90, and MRI was read as osteomyelitis in the second, third, and fifth toes. However, orthopedic surgeon Dr. Duda Sharol Givenuated her and did not believe she had osteomyelitis and therefore did not require surgery nor antibiotics. He will follow her on an outpatient basis.  3. Dyspnea on exertion: She complains of dyspnea on exertion for years. We suspect this is from obstructive sleep apnea and obesity hypoventilation syndrome. We ruled out pulmonary hypertension with concomitant right heart failure with a transthoracic echocardiogram. We referred her to a sleep study as outpatient.  4. Subclinical hypothyroidism: Her TSH was 10.3 this admission with normal free T3 and T4 levels, consistent with subclinical hypothyroidism. UpToDate recommends treating subclinical hypothyroidism if the TSH is greater than 10 but we did not initiate synthroid in light of her possible osteomyelitis. This  should be followed on an outpatient basis.  Discharge Vitals:   BP 124/70 mmHg  Pulse 72  Temp(Src) 98 F (36.7 C) (Oral)  Resp 16  Ht '5\' 3"'  (1.6 m)  Wt 111.3 kg (245 lb 6 oz)  BMI 43.48 kg/m2  SpO2 100%  Discharge Labs:  Results for orders placed or performed during the hospital encounter of 07/04/15 (from the past 24 hour(s))  Glucose, capillary     Status: Abnormal   Collection Time: 07/06/15 11:37 AM  Result Value Ref Range   Glucose-Capillary 109 (H) 65 - 99 mg/dL   Comment 1 Notify RN  Comment 2 Document in Chart   Glucose, capillary     Status: Abnormal   Collection Time: 07/06/15  5:04 PM  Result Value Ref Range   Glucose-Capillary 138 (H) 65 - 99 mg/dL   Comment 1 Notify RN    Comment 2 Document in Chart   Glucose, capillary     Status: Abnormal   Collection Time: 07/06/15 10:01 PM  Result Value Ref Range   Glucose-Capillary 104 (H) 65 - 99 mg/dL  Glucose, capillary     Status: Abnormal   Collection Time: 07/07/15  5:29 AM  Result Value Ref Range   Glucose-Capillary 124 (H) 65 - 99 mg/dL    Signed: Loleta Chance, MD 07/07/2015, 9:33 AM    Services Ordered on Discharge: Home physical therapy and nursing for wound care

## 2015-07-07 NOTE — Discharge Instructions (Signed)
Diabetic Neuropathy Diabetic neuropathy is a nerve disease or nerve damage that is caused by diabetes mellitus. About half of all people with diabetes mellitus have some form of nerve damage. Nerve damage is more common in those who have had diabetes mellitus for many years and who generally have not had good control of their blood sugar (glucose) level. Diabetic neuropathy is a common complication of diabetes mellitus. There are three more common types of diabetic neuropathy and a fourth type that is less common and less understood:   Peripheral neuropathy--This is the most common type of diabetic neuropathy. It causes damage to the nerves of the feet and legs first and then eventually the hands and arms.The damage affects the ability to sense touch.  Autonomic neuropathy--This type causes damage to the autonomic nervous system, which controls the following functions:  Heartbeat.  Body temperature.  Blood pressure.  Urination.  Digestion.  Sweating.  Sexual function.  Focal neuropathy--Focal neuropathy can be painful and unpredictable and occurs most often in older adults with diabetes mellitus. It involves a specific nerve or one area and often comes on suddenly. It usually does not cause long-term problems.  Radiculoplexus neuropathy-- Sometimes called lumbosacral radiculoplexus neuropathy, radiculoplexus neuropathy affects the nerves of the thighs, hips, buttocks, or legs. It is more common in people with type 2 diabetes mellitus and in older men. It is characterized by debilitating pain, weakness, and atrophy, usually in the thigh muscles. CAUSES  The cause of peripheral, autonomic, and focal neuropathies is diabetes mellitus that is uncontrolled and high glucose levels. The cause of radiculoplexus neuropathy is unknown. However, it is thought to be caused by inflammation related to uncontrolled glucose levels. SIGNS AND SYMPTOMS  Peripheral Neuropathy Peripheral neuropathy develops  slowly over time. When the nerves of the feet and legs no longer work there may be:  1. Burning, stabbing, or aching pain in the legs or feet. 2. Inability to feel pressure or pain in your feet. This can lead to: 1. Thick calluses over pressure areas. 2. Pressure sores. 3. Ulcers. 3. Foot deformities. 4. Reduced ability to feel temperature changes. 5. Muscle weakness. Autonomic Neuropathy The symptoms of autonomic neuropathy vary depending on which nerves are affected. Symptoms may include:  Problems with digestion, such as:  Feeling sick to your stomach (nausea).  Vomiting.  Bloating.  Constipation.  Diarrhea.  Abdominal pain.  Difficulty with urination. This occurs if you lose your ability to sense when your bladder is full. Problems include:  Urine leakage (incontinence).  Inability to empty your bladder completely (retention).  Rapid or irregular heartbeat (palpitations).  Blood pressure drops when you stand up (orthostatic hypotension). When you stand up you may feel:  Dizzy.  Weak.  Faint.  In men, inability to attain and maintain an erection.  In women, vaginal dryness and problems with decreased sexual desire and arousal.  Problems with body temperature regulation.  Increased or decreased sweating. Focal Neuropathy  Abnormal eye movements or abnormal alignment of both eyes.  Weakness in the wrist.  Foot drop. This results in an inability to lift the foot properly and abnormal walking or foot movement.  Paralysis on one side of your face (Bell palsy).  Chest or abdominal pain. Radiculoplexus Neuropathy  Sudden, severe pain in your hip, thigh, or buttocks.  Weakness and wasting of thigh muscles.  Difficulty rising from a seated position.  Abdominal swelling.  Unexplained weight loss (usually more than 10 lb [4.5 kg]). DIAGNOSIS  Peripheral Neuropathy Your senses may  be tested. Sensory function testing can be done with:  A light touch  using a monofilament.  A vibration with tuning fork.  A sharp sensation with a pin prick. Other tests that can help diagnose neuropathy are:  Nerve conduction velocity. This test checks the transmission of an electrical current through a nerve.  Electromyography. This shows how muscles respond to electrical signals transmitted by nearby nerves.  Quantitative sensory testing. This is used to assess how your nerves respond to vibrations and changes in temperature. Autonomic Neuropathy Diagnosis is often based on reported symptoms. Tell your health care provider if you experience:   Dizziness.   Constipation.   Diarrhea.   Inappropriate urination or inability to urinate.   Inability to get or maintain an erection.  Tests that may be done include:   Electrocardiography or Holter monitor. These are tests that can help show problems with the heart rate or heart rhythm.   An X-ray exam may be done. Focal Neuropathy Diagnosis is made based on your symptoms and what your health care provider finds during your exam. Other tests may be done. They may include:  Nerve conduction velocities. This checks the transmission of electrical current through a nerve.  Electromyography. This shows how muscles respond to electrical signals transmitted by nearby nerves.  Quantitative sensory testing. This test is used to assess how your nerves respond to vibration and changes in temperature. Radiculoplexus Neuropathy  Often the first thing is to eliminate any other issue or problems that might be the cause, as there is no stick test for diagnosis.  X-ray exam of your spine and lumbar region.  Spinal tap to rule out cancer.  MRI to rule out other lesions. TREATMENT  Once nerve damage occurs, it cannot be reversed. The goal of treatment is to keep the disease or nerve damage from getting worse and affecting more nerve fibers. Controlling your blood glucose level is the key. Most people with  radiculoplexus neuropathy see at least a partial improvement over time. You will need to keep your blood glucose and HbA1c levels in the target range determined by your health care provider. Things that help control blood glucose levels include:   Blood glucose monitoring.   Meal planning.   Physical activity.   Diabetes medicine.  Over time, maintaining lower blood glucose levels helps lessen symptoms. Sometimes, prescription pain medicine is needed. HOME CARE INSTRUCTIONS:  Do not smoke.  Keep your blood glucose level in the range that you and your health care provider have determined acceptable for you.  Keep your blood pressure level in the range that you and your health care provider have determined acceptable for you.  Eat a well-balanced diet.  Be active every day.  Check your feet every day. SEEK MEDICAL CARE IF:   You have burning, stabbing, or aching pain in the legs or feet.  You are unable to feel pressure or pain in your feet.  You develop problems with digestion such as:  Nausea.  Vomiting.  Bloating.  Constipation.  Diarrhea.  Abdominal pain.  You have difficulty with urination, such as:  Incontinence.  Retention.  You have palpitations.  You develop orthostatic hypotension. When you stand up you may feel:  Dizzy.  Weak.  Faint.  You cannot attain and maintain an erection (in men).  You have vaginal dryness and problems with decreased sexual desire and arousal (in women).  You have severe pain in your thighs, legs, or buttocks.  You have unexplained weight loss.  Document Released: 01/10/2002 Document Revised: 08/22/2013 Document Reviewed: 04/12/2013 [facility] Patient Information 2015 [place], [place]. This information is not intended to replace advice given to you by your health care provider. Make sure you discuss any questions you have with your health care provider.  Sleep Apnea  Sleep apnea is a sleep disorder characterized by  abnormal pauses in breathing while you sleep. When your breathing pauses, the level of oxygen in your blood decreases. This causes you to move out of deep sleep and into light sleep. As a result, your quality of sleep is poor, and the system that carries your blood throughout your body (cardiovascular system) experiences stress. If sleep apnea remains untreated, the following conditions can develop:  High blood pressure (hypertension).  Coronary artery disease.  Inability to achieve or maintain an erection (impotence).  Impairment of your thought process (cognitive dysfunction). There are three types of sleep apnea: 6. Obstructive sleep apnea--Pauses in breathing during sleep because of a blocked airway. 7. Central sleep apnea--Pauses in breathing during sleep because the area of the brain that controls your breathing does not send the correct signals to the muscles that control breathing. 8. Mixed sleep apnea--A combination of both obstructive and central sleep apnea. RISK FACTORS The following risk factors can increase your risk of developing sleep apnea:  Being overweight.  Smoking.  Having narrow passages in your nose and throat.  Being of older age.  Being female.  Alcohol use.  Sedative and tranquilizer use.  Ethnicity. Among individuals younger than 35 years, African Americans are at increased risk of sleep apnea. SYMPTOMS   Difficulty staying asleep.  Daytime sleepiness and fatigue.  Loss of energy.  Irritability.  Loud, heavy snoring.  Morning headaches.  Trouble concentrating.  Forgetfulness.  Decreased interest in sex. DIAGNOSIS  In order to diagnose sleep apnea, your caregiver will perform a physical examination. Your caregiver may suggest that you take a home sleep test. Your caregiver may also recommend that you spend the night in a sleep lab. In the sleep lab, several monitors record information about your heart, lungs, and brain while you sleep. Your  leg and arm movements and blood oxygen level are also recorded. TREATMENT The following actions may help to resolve mild sleep apnea:  Sleeping on your side.   Using a decongestant if you have nasal congestion.   Avoiding the use of depressants, including alcohol, sedatives, and narcotics.   Losing weight and modifying your diet if you are overweight. There also are devices and treatments to help open your airway:  Oral appliances. These are custom-made mouthpieces that shift your lower jaw forward and slightly open your bite. This opens your airway.  Devices that create positive airway pressure. This positive pressure "splints" your airway open to help you breathe better during sleep. The following devices create positive airway pressure:  Continuous positive airway pressure (CPAP) device. The CPAP device creates a continuous level of air pressure with an air pump. The air is delivered to your airway through a mask while you sleep. This continuous pressure keeps your airway open.  Nasal expiratory positive airway pressure (EPAP) device. The EPAP device creates positive air pressure as you exhale. The device consists of single-use valves, which are inserted into each nostril and held in place by adhesive. The valves create very little resistance when you inhale but create much more resistance when you exhale. That increased resistance creates the positive airway pressure. This positive pressure while you exhale keeps your airway open, making it  easier to breath when you inhale again.  Bilevel positive airway pressure (BPAP) device. The BPAP device is used mainly in patients with central sleep apnea. This device is similar to the CPAP device because it also uses an air pump to deliver continuous air pressure through a mask. However, with the BPAP machine, the pressure is set at two different levels. The pressure when you exhale is lower than the pressure when you inhale.  Surgery. Typically,  surgery is only done if you cannot comply with less invasive treatments or if the less invasive treatments do not improve your condition. Surgery involves removing excess tissue in your airway to create a wider passage way. Document Released: 10/22/2002 Document Revised: 02/26/2013 Document Reviewed: 03/09/2012 [facility] Patient Information 2015 [place], [place]. This information is not intended to replace advice given to you by your health care provider. Make sure you discuss any questions you have with your health care provider.

## 2015-07-08 LAB — T3, FREE: T3, Free: 3.3 pg/mL (ref 2.0–4.4)

## 2015-07-10 ENCOUNTER — Ambulatory Visit: Payer: BLUE CROSS/BLUE SHIELD | Admitting: Neurology

## 2015-07-28 ENCOUNTER — Ambulatory Visit: Payer: Medicare Other | Admitting: Neurology

## 2015-07-31 ENCOUNTER — Institutional Professional Consult (permissible substitution): Payer: Medicare Other | Admitting: Neurology

## 2015-09-01 ENCOUNTER — Ambulatory Visit: Payer: BLUE CROSS/BLUE SHIELD | Admitting: Neurology

## 2015-09-04 ENCOUNTER — Ambulatory Visit (INDEPENDENT_AMBULATORY_CARE_PROVIDER_SITE_OTHER): Payer: BLUE CROSS/BLUE SHIELD | Admitting: Neurology

## 2015-09-04 ENCOUNTER — Encounter: Payer: Self-pay | Admitting: Neurology

## 2015-09-04 VITALS — BP 100/68 | HR 60 | Ht 63.0 in | Wt 241.5 lb

## 2015-09-04 DIAGNOSIS — E0842 Diabetes mellitus due to underlying condition with diabetic polyneuropathy: Secondary | ICD-10-CM

## 2015-09-04 DIAGNOSIS — E1140 Type 2 diabetes mellitus with diabetic neuropathy, unspecified: Secondary | ICD-10-CM

## 2015-09-04 MED ORDER — GABAPENTIN 300 MG PO CAPS: ORAL_CAPSULE | ORAL | Status: DC

## 2015-09-04 NOTE — Progress Notes (Signed)
Note routed

## 2015-09-04 NOTE — Progress Notes (Signed)
Follow-up Visit   Date: 09/04/2015    Erin [name] MRN: [id number] DOB: Jan 04, 1956   Interim History: Erin [name] is a 59 y.o. right-handed Caucasian female with GERD, fibromyalgia, hypertension, depression, migraine, diabetes mellitus type 2, vitamin B12 deficiency, chronic pain syndrome returning to the clinic for follow-up of diabetic neuropathy.  The patient was accompanied to the clinic by self.  History of present illness: Starting around 2013, she developed sudden onset of numbness/tingling involving her right foot and within three months, she developed similar symptoms on the right. She also complains of burning sensation and intermittent stabbing pain over the arch of the feet. Symptoms are constant and worse at night and light pressure. Nothing that alleviates her pain. Discomfort is now at the level of mid-calf and involves her ankles and feet. She was started on Lyrica initially and is currently on 180m TID which seems to alleviate some of the pain. She takes oxycodone 3100mevery 4-5 hours. In the fall of 2015, she developed intermittent numbness/tingling of the hands which is worse in the morning.   She walks with a cane for balance. No recent falls.   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     She also seen podiatry and a chiropractor. She was told her spine was totally out of alignment and received laser therapy to her feet for three weeks, but felt even worse so stopped going.   UPDATE 03/11/2015:  Since increasing Lyrica to 2008mhree times daily, she has noticed improvement of her pain, because she no longer has stabbing pain.  She was able to reduce her oxycodone to 31m7m4 times per day, whereas previously she was taking 4-5.  She continues to have spells of hot/cold sensation, but is overall pleased with her improvement.  Neuropathy labs were reviewed and returned normal.  UPDATE 09/04/2015:  She was hospitalized  in August due to leg swelling and shortness of breath.  She was treated for possible CHF and due to concern of Lyrica causing edema, it was reduced to Lyrica 50mg66mly.  She does not notice any benefit with Lyrica 50mg 28mis interested in trying alternative medication.  She has lost 34lb since her admission.  For pain, she is back to taking oxycodone 4 times per day, which controls her pain.  She has no worsening symptoms of her paresthesias.  No new weakness or falls.   Medications:  Current Outpatient Prescriptions on File Prior to Visit  Medication Sig Dispense Refill  . aspirin 81 MG chewable tablet Chew 1 tablet (81 mg total) by mouth daily. 90 tablet 3  . benazepril-hydrochlorthiazide (LOTENSIN HCT) 20-25 MG per tablet Take 1 tablet by mouth daily.    . metformin (FORTAMET) 500 MG (OSM) 24 hr tablet Take 500 mg by mouth daily with breakfast.    . omeprazole (PRILOSEC) 40 MG capsule Take 40 mg by mouth daily.    . oxycMarland Kitchendone (ROXICODONE) 30 MG immediate release tablet Take 30 mg by mouth every 4 (four) hours as needed for pain.     No current facility-administered medications on file prior to visit.    Allergies: No Known Allergies  Review of Systems:  CONSTITUTIONAL: No fevers, chills, night sweats, or weight loss.  EYES: No visual changes or eye pain ENT: No hearing changes.  No history of nose bleeds.   RESPIRATORY: No cough, wheezing and shortness of breath.   CARDIOVASCULAR: Negative for chest pain, and palpitations.  GI: Negative for abdominal discomfort, blood in stools or black stools.  No recent change in bowel habits.   GU:  No history of incontinence.   MUSCLOSKELETAL: No history of joint pain or swelling.  No myalgias.   SKIN: Negative for lesions, rash, and itching.   ENDOCRINE: Negative for cold or heat intolerance, polydipsia or goiter.   PSYCH:  No depression or anxiety symptoms.   NEURO: As Above.   Vital Signs:  BP 100/68 mmHg  Pulse 60  Ht '5\' 3"'  (1.6 m)   Wt 241 lb 8 oz (109.544 kg)  BMI 42.79 kg/m2  Neurological Exam: MENTAL STATUS including orientation to time, place, person, recent and remote memory, attention span and concentration, language, and fund of knowledge is normal.  Speech is not dysarthric.  CRANIAL NERVES:  Pupils equal round and reactive to light.  Normal conjugate, extra-ocular eye movements in all directions of gaze.  No ptosis.  Face is symmetric.   MOTOR:  Motor strength is 5/5 in all extremities.    SENSORY: Vibration absent distal to ankles bilaterally.  COORDINATION/GAIT:  Gait is slightly wide-based due to body habitus  Data: Labs 12/16/2014:  ESR 45, vitamin B12 348, vitamin B6 6.5, copper 131, TSH 7.6, fT3 2.4, fT4 0.88, HbA1c 5.6  IMPRESSION: Peripheral neuropathy, likely idiopathic as her diabetes has been well-controlled  - Symptoms previously well-controlled on Lyrica 220m BID and she was able to reduce oxyocodone, but she developed bilateral leg edema so it was tapered down  - I doubt she is getting any benefit from Lyrica 556m so will stop it  - Start gabapentin 3006mID (titration schedule provided).  Common side effects discussed.  If she develops new leg edema, will transition her to nortriptyline.  Going forward, consider adding amitriptyline, Cymbalta, venlafaxine, or alpha-lipoic acid.  - I stressed that the goal is to reduce the amount of oxycodone, which she is slowly doing.  Return to clinic in 4 months   The duration of this appointment visit was 25 minutes of face-to-face time with the patient.  Greater than 50% of this time was spent in counseling, explanation of diagnosis, planning of further management, and coordination of care.   Thank you for allowing me to participate in patient's care.  If I can answer any additional questions, I would be pleased to do so.    Sincerely,    Reene Harlacher K. PatPosey ProntoO

## 2015-09-04 NOTE — Patient Instructions (Addendum)
1.  Start Gabapentin 300 mg tablets     Morning       Afternoon        Evening   Week 1                                  1 tab               Week 2 1 tab                   1 tab               Continue  1 tab          1 tab            1 tab         Call with update in 4-6 weeks, to determine further increase in medication.  If you develop increased sleepiness, stay at the lower dose.           2.  STOP Lyrica.    3.  Continue to wean the oxycodone as you are able to do so.   4.  Return to clinic in January-February

## 2015-09-12 ENCOUNTER — Ambulatory Visit: Payer: Medicare Other | Admitting: Neurology

## 2015-10-07 ENCOUNTER — Telehealth: Payer: Self-pay | Admitting: Neurology

## 2015-10-07 NOTE — Telephone Encounter (Signed)
Pt called to request an increase of med/ Gabapentin//call back @ [phone]

## 2015-10-07 NOTE — Telephone Encounter (Signed)
OK to increase to 2 tablets three times daily, ask her to make changes every 3 days.

## 2015-10-07 NOTE — Telephone Encounter (Signed)
Patient is up to 1 po tid x a little over a week and still can't tell a difference.  Please advise.

## 2015-10-08 ENCOUNTER — Other Ambulatory Visit: Payer: Self-pay | Admitting: *Deleted

## 2015-10-08 MED ORDER — GABAPENTIN 300 MG PO CAPS: ORAL_CAPSULE | ORAL | Status: DC

## 2015-10-08 NOTE — Telephone Encounter (Signed)
Patient given instructions and new Rx sent in.   

## 2015-12-25 ENCOUNTER — Other Ambulatory Visit (INDEPENDENT_AMBULATORY_CARE_PROVIDER_SITE_OTHER): Payer: BLUE CROSS/BLUE SHIELD

## 2015-12-25 ENCOUNTER — Ambulatory Visit (INDEPENDENT_AMBULATORY_CARE_PROVIDER_SITE_OTHER): Payer: BLUE CROSS/BLUE SHIELD | Admitting: Neurology

## 2015-12-25 ENCOUNTER — Encounter: Payer: Self-pay | Admitting: Neurology

## 2015-12-25 VITALS — BP 120/84 | HR 74 | Wt 249.1 lb

## 2015-12-25 DIAGNOSIS — Z79899 Other long term (current) drug therapy: Secondary | ICD-10-CM

## 2015-12-25 DIAGNOSIS — E0842 Diabetes mellitus due to underlying condition with diabetic polyneuropathy: Secondary | ICD-10-CM | POA: Diagnosis not present

## 2015-12-25 LAB — COMPREHENSIVE METABOLIC PANEL
ALBUMIN: 4 g/dL (ref 3.5–5.2)
ALK PHOS: 135 U/L — AB (ref 39–117)
ALT: 21 U/L (ref 0–35)
AST: 31 U/L (ref 0–37)
BUN: 14 mg/dL (ref 6–23)
CALCIUM: 9.5 mg/dL (ref 8.4–10.5)
CHLORIDE: 96 meq/L (ref 96–112)
CO2: 31 mEq/L (ref 19–32)
Creatinine, Ser: 1.17 mg/dL (ref 0.40–1.20)
GFR: 50.56 mL/min — AB (ref >60.00)
Glucose, Bld: 80 mg/dL (ref 70–99)
POTASSIUM: 4.2 meq/L (ref 3.5–5.1)
SODIUM: 135 meq/L (ref 135–145)
TOTAL PROTEIN: 7.5 g/dL (ref 6.0–8.3)
Total Bilirubin: 0.2 mg/dL (ref 0.2–1.2)

## 2015-12-25 MED ORDER — NORTRIPTYLINE HCL 10 MG PO CAPS: ORAL_CAPSULE | ORAL | Status: DC

## 2015-12-25 MED ORDER — GABAPENTIN 300 MG PO CAPS: ORAL_CAPSULE | ORAL | Status: DC

## 2015-12-25 NOTE — Progress Notes (Signed)
Follow-up Visit   Date: 12/25/2015    Erin [name] MRN: [id number] DOB: 12-20-1955   Interim History: Erin [name] is a 60 y.o. right-handed Caucasian female with GERD, fibromyalgia, hypertension, depression, migraine, diabetes mellitus type 2, vitamin B12 deficiency, chronic pain syndrome returning to the clinic for follow-up of diabetic neuropathy.  The patient was accompanied to the clinic by self.  History of present illness: Starting around 2013, she developed sudden onset of numbness/tingling involving her right foot and within three months, she developed similar symptoms on the right. She also complains of burning sensation and intermittent stabbing pain over the arch of the feet.Discomfort is now at the level of mid-calf and involves her ankles and feet. She was started on Lyrica 118m TID which seems to alleviate some of the pain. She takes oxycodone 373mevery 4-5 hours. In the fall of 2015, she developed intermittent numbness/tingling of the hands which is worse in the morning.   She walks with a cane for balance. No recent falls. 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     She also seen podiatry and a chiropractor. She was told her spine was totally out of alignment and received laser therapy to her feet for three weeks, but felt even worse so stopped going.   UPDATE 03/11/2015:  Since increasing Lyrica to 20055mhree times daily, she has noticed improvement of her pain, because she no longer has stabbing pain.  She was able to reduce her oxycodone to 59m33m4 times per day, whereas previously she was taking 4-5.  She continues to have spells of hot/cold sensation, but is overall pleased with her improvement.    UPDATE 09/04/2015:  She was hospitalized in August due to leg swelling and shortness of breath.  She was treated for possible CHF and due to concern of Lyrica causing edema, it was reduced to Lyrica 50mg86mly.  She does not  notice any benefit with Lyrica 50mg 66mis interested in trying alternative medication.  She has lost 34lb since her admission.  For pain, she is back to taking oxycodone 4 times per day, which controls her pain.    UPDATE 12/24/2014:  She has noticed mild improvement in paresthesias since increasing gabapentin to 600mg T42m She gait is affected by her pain because she always feels as if she is walking on needles, but fortunately, she had not had any falls.  She has some tingling in the hands, and denies weakness.   Medications:  Current Outpatient Prescriptions on File Prior to Visit  Medication Sig Dispense Refill  . aspirin 81 MG chewable tablet Chew 1 tablet (81 mg total) by mouth daily. 90 tablet 3  . benazepril-hydrochlorthiazide (LOTENSIN HCT) 20-25 MG per tablet Take 1 tablet by mouth daily.    . gabapMarland Kitchenntin (NEURONTIN) 300 MG capsule Take 2 tablets three times a day. 180 capsule 5  . levothyroxine (SYNTHROID, LEVOTHROID) 50 MCG tablet   10  . metformin (FORTAMET) 500 MG (OSM) 24 hr tablet Take 500 mg by mouth daily with breakfast.    . omeprazole (PRILOSEC) 40 MG capsule Take 40 mg by mouth daily.    . oxycoMarland Kitchenone (ROXICODONE) 30 MG immediate release tablet Take 30 mg by mouth every 4 (four) hours as needed for pain.    . SUMAtriptan (IMITREX) 100 MG tablet   9   No current facility-administered medications on file prior to visit.    Allergies: No Known Allergies  Review  of Systems:  CONSTITUTIONAL: No fevers, chills, night sweats, or weight loss.  EYES: No visual changes or eye pain ENT: No hearing changes.  No history of nose bleeds.   RESPIRATORY: No cough, wheezing and shortness of breath.   CARDIOVASCULAR: Negative for chest pain, and palpitations.   GI: Negative for abdominal discomfort, blood in stools or black stools.  No recent change in bowel habits.   GU:  No history of incontinence.   MUSCLOSKELETAL: No history of joint pain or swelling.  No myalgias.   SKIN: Negative  for lesions, rash, and itching.   ENDOCRINE: Negative for cold or heat intolerance, polydipsia or goiter.   PSYCH:  No depression or anxiety symptoms.   NEURO: As Above.   Vital Signs:  BP 120/84 mmHg  Pulse 74  Wt 249 lb 1 oz (112.974 kg)  SpO2 94%  Neurological Exam: MENTAL STATUS including orientation to time, place, person, recent and remote memory, attention span and concentration, language, and fund of knowledge is normal.  Speech is not dysarthric.  CRANIAL NERVES:  Pupils equal round and reactive to light.  Normal conjugate, extra-ocular eye movements in all directions of gaze.  No ptosis.  Face is symmetric.   MOTOR:  Motor strength is 5/5 in all extremities.    SENSORY: Vibration absent distal to ankles bilaterally.  COORDINATION/GAIT:  Gait is slightly wide-based due to body habitus and antalgic due to pain  Data: Labs 12/16/2014:  ESR 45, vitamin B12 348, vitamin B6 6.5, copper 131, TSH 7.6, fT3 2.4, fT4 0.88, HbA1c 5.6  IMPRESSION: Peripheral neuropathy, likely idiopathic as her diabetes has been well-controlled  - Symptoms previously well-controlled on Lyrica 222m BID, but she developed bilateral leg edema so it was discontinued  - Continue gabapentin 6022mTID (cautious with up titration due to renal dysfunction)  - Start nortriptyline 1020mt bedtime.  Common side effects discussed.   - Going forward, consider adding amitriptyline, Cymbalta, venlafaxine, or alpha-lipoic acid.  - I stressed that the goal is to reduce the amount of oxycodone, which she is slowly doing  - Check CMP to be sure her current dose of gabapentin is renally adjusted  Return to clinic in 4 months   The duration of this appointment visit was 25 minutes of face-to-face time with the patient.  Greater than 50% of this time was spent in counseling, explanation of diagnosis, planning of further management, and coordination of care.   Thank you for allowing me to participate in patient's care.   If I can answer any additional questions, I would be pleased to do so.    Sincerely,    Zendaya Groseclose K. PatPosey ProntoO

## 2015-12-25 NOTE — Patient Instructions (Addendum)
1.  Continue gabapentin  three times daily 2.  Start nortriptyline  at bedtime for two weeks, then increase to 2 tablets at bedtime 3.  Check complete metabolic panel  Return to clinic 34-months

## 2015-12-26 NOTE — Progress Notes (Signed)
Note routed

## 2016-01-23 ENCOUNTER — Telehealth: Payer: Self-pay | Admitting: Neurology

## 2016-01-23 ENCOUNTER — Other Ambulatory Visit: Payer: Self-pay | Admitting: *Deleted

## 2016-01-23 MED ORDER — GABAPENTIN 300 MG PO CAPS
ORAL_CAPSULE | ORAL | Status: DC
Start: 2016-01-23 — End: 2016-08-23

## 2016-01-23 NOTE — Telephone Encounter (Signed)
Patient said that she had to stop the nortriptyline due to dry mouth.  She was wondering if we could increase her gabapentin.  She already had her lab work done.  Please advise.

## 2016-01-23 NOTE — Telephone Encounter (Signed)
Pt needs to talk to some one  About medication please call [phone]

## 2016-01-23 NOTE — Telephone Encounter (Signed)
She can increase gabapentin to 900mg  at bedtime and stay at 600mg  in the morning and afternoon, but I would not increase any higher because of her kidney function.  OK to d/c nortriptyline.  We can try Cymbalta 30mg  going forward.  Erin [name] K. Allena KatzPatel, DO

## 2016-03-31 DIAGNOSIS — K769 Liver disease, unspecified: Secondary | ICD-10-CM | POA: Diagnosis not present

## 2016-03-31 DIAGNOSIS — R1114 Bilious vomiting: Secondary | ICD-10-CM | POA: Diagnosis not present

## 2016-03-31 DIAGNOSIS — R1011 Right upper quadrant pain: Secondary | ICD-10-CM | POA: Diagnosis not present

## 2016-03-31 DIAGNOSIS — N289 Disorder of kidney and ureter, unspecified: Secondary | ICD-10-CM | POA: Diagnosis not present

## 2016-04-14 ENCOUNTER — Ambulatory Visit: Payer: Self-pay | Admitting: Surgery

## 2016-04-15 ENCOUNTER — Other Ambulatory Visit: Payer: Self-pay | Admitting: Surgery

## 2016-04-15 DIAGNOSIS — R1900 Intra-abdominal and pelvic swelling, mass and lump, unspecified site: Secondary | ICD-10-CM

## 2016-04-20 ENCOUNTER — Ambulatory Visit
Admission: RE | Admit: 2016-04-20 | Discharge: 2016-04-20 | Disposition: A | Discharge status: Home / Self Care | Payer: BLUE CROSS/BLUE SHIELD | Source: Ambulatory Visit | Attending: Surgery | Admitting: Surgery

## 2016-04-20 DIAGNOSIS — R1900 Intra-abdominal and pelvic swelling, mass and lump, unspecified site: Secondary | ICD-10-CM

## 2016-04-20 MED ORDER — IOPAMIDOL (ISOVUE-300) INJECTION 61%
125.0000 mL | Freq: Once | INTRAVENOUS | Status: AC | PRN
  Administered 2016-04-20: 125 mL via INTRAVENOUS

## 2016-04-23 ENCOUNTER — Encounter: Payer: Self-pay | Admitting: Neurology

## 2016-04-23 ENCOUNTER — Ambulatory Visit (INDEPENDENT_AMBULATORY_CARE_PROVIDER_SITE_OTHER): Payer: BLUE CROSS/BLUE SHIELD | Admitting: Neurology

## 2016-04-23 VITALS — BP 110/70 | HR 93 | Ht 63.0 in | Wt 257.4 lb

## 2016-04-23 DIAGNOSIS — Z79899 Other long term (current) drug therapy: Secondary | ICD-10-CM

## 2016-04-23 DIAGNOSIS — E0842 Diabetes mellitus due to underlying condition with diabetic polyneuropathy: Secondary | ICD-10-CM | POA: Diagnosis not present

## 2016-04-23 MED ORDER — DULOXETINE HCL 30 MG PO CPEP
30.0000 mg | ORAL_CAPSULE | Freq: Every day | ORAL | Status: DC
Start: 2016-04-23 — End: 2016-08-23

## 2016-04-23 NOTE — Patient Instructions (Signed)
1.  Start Cymbalta 30mg  daily 2.  Reduce gabapentin by 1 tablet (300mg ) every 3-5 days with the goal of discontinuing the medication  Return to clinic in 4 months

## 2016-04-23 NOTE — Progress Notes (Signed)
Follow-up Visit   Date: 04/23/2016    Erin [name] MRN: [id number] DOB: November 18, 1955   Interim History: Erin [name] is a 60 y.o. right-handed Caucasian female with GERD, fibromyalgia, hypertension, depression, migraine, diabetes mellitus type 2, vitamin B12 deficiency, chronic pain syndrome returning to the clinic for follow-up of diabetic neuropathy.  The patient was accompanied to the clinic by self.  History of present illness: Starting around 2013, she developed sudden onset of numbness/tingling involving her right foot and within three months, she developed similar symptoms on the right. She also complains of burning sensation and intermittent stabbing pain over the arch of the feet.Discomfort is now at the level of mid-calf and involves her ankles and feet. She was started on Lyrica 175m TID which seems to alleviate some of the pain. She takes oxycodone 317mevery 4-5 hours. In the fall of 2015, she developed intermittent numbness/tingling of the hands which is worse in the morning.   She walks with a cane for balance. No recent falls. 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     She also seen podiatry and a chiropractor. She was told her spine was totally out of alignment and received laser therapy to her feet for three weeks, but felt even worse so stopped going.   UPDATE 03/11/2015:  Since increasing Lyrica to 20068mhree times daily, she has noticed improvement of her pain, because she no longer has stabbing pain.  She was able to reduce her oxycodone to 60m55m4 times per day, whereas previously she was taking 4-5.  She continues to have spells of hot/cold sensation, but is overall pleased with her improvement.    UPDATE 09/04/2015:  She was hospitalized in August due to leg swelling and shortness of breath.  She was treated for possible CHF and due to concern of Lyrica causing edema, it was reduced to Lyrica 50mg31mly.  She does not  notice any benefit with Lyrica 50mg 23mis interested in trying alternative medication.  She has lost 34lb since her admission.  For pain, she is back to taking oxycodone 4 times per day, which controls her pain.    UPDATE 12/24/2014:  She has noticed mild improvement in paresthesias since increasing gabapentin to 600mg T35m She gait is affected by her pain because she always feels as if she is walking on needles, but fortunately, she had not had any falls.     UPDATE 04/23/2016:  She unfortunately has gained weight with gabapentin so would like to discontinue it. Nortriptyline was stopped due to dry mouth.  She continues to take oxycodone 60mg th40mtimes daily, which she was previously taking 6 times daily.  She has some tingling in the hands, and denies weakness.  Medications:  Current Outpatient Prescriptions on File Prior to Visit  Medication Sig Dispense Refill  . aspirin 81 MG chewable tablet Chew 1 tablet (81 mg total) by mouth daily. 90 tablet 3  . benazepril-hydrochlorthiazide (LOTENSIN HCT) 20-25 MG per tablet Take 1 tablet by mouth daily.    . gabapeMarland Kitchentin (NEURONTIN) 300 MG capsule Take 2 tablets in the morning and afternoon.  Take 3 tablets at bedtime. 210 capsule 5  . levothyroxine (SYNTHROID, LEVOTHROID) 50 MCG tablet   10  . metformin (FORTAMET) 500 MG (OSM) 24 hr tablet Take 500 mg by mouth daily with breakfast.    . nortriptyline (PAMELOR) 10 MG capsule Take 1 tablet at bedtime for 2 weeks, then increase to  2 tablets at bedtime 60 capsule 5  . omeprazole (PRILOSEC) 40 MG capsule Take 40 mg by mouth daily.    Marland Kitchen oxycodone (ROXICODONE) 30 MG immediate release tablet Take 30 mg by mouth every 4 (four) hours as needed for pain.    . SUMAtriptan (IMITREX) 100 MG tablet   9  . Vitamin D, Ergocalciferol, (DRISDOL) 50000 units CAPS capsule   10   No current facility-administered medications on file prior to visit.    Allergies: No Known Allergies  Review of Systems:  CONSTITUTIONAL: No  fevers, chills, night sweats, or weight loss.  EYES: No visual changes or eye pain ENT: No hearing changes.  No history of nose bleeds.   RESPIRATORY: No cough, wheezing and shortness of breath.   CARDIOVASCULAR: Negative for chest pain, and palpitations.   GI: Negative for abdominal discomfort, blood in stools or black stools.  No recent change in bowel habits.   GU:  No history of incontinence.   MUSCLOSKELETAL: No history of joint pain or swelling.  No myalgias.   SKIN: Negative for lesions, rash, and itching.   ENDOCRINE: Negative for cold or heat intolerance, polydipsia or goiter.   PSYCH:  No depression or anxiety symptoms.   NEURO: As Above.   Vital Signs:  BP 110/70 mmHg  Pulse 93  Ht '5\' 3"'  (1.6 m)  Wt 257 lb 7 oz (116.773 kg)  BMI 45.61 kg/m2  SpO2 95%  Neurological Exam: MENTAL STATUS including orientation to time, place, person, recent and remote memory, attention span and concentration, language, and fund of knowledge is normal.  Speech is not dysarthric.  CRANIAL NERVES:  Pupils equal round and reactive to light.  Normal conjugate, extra-ocular eye movements in all directions of gaze.  No ptosis.  Face is symmetric.   MOTOR:  Motor strength is 5/5 in all extremities.    SENSORY: Vibration absent distal to ankles bilaterally.  COORDINATION/GAIT:  Gait is slightly wide-based due to body habitus and antalgic due to pain  Data: Labs 12/16/2014:  ESR 45, vitamin B12 348, vitamin B6 6.5, copper 131, TSH 7.6, fT3 2.4, fT4 0.88, HbA1c 5.6  IMPRESSION: Peripheral neuropathy, likely idiopathic as her diabetes has been well-controlled  - Symptoms previously well-controlled on Lyrica 266m BID, but she developed bilateral leg edema so it was discontinued  - Previously tried:  Nortriptyline (dry mouth), Lyrica (bilateral leg edema), lidocaine ointment  - Recommend Cymbalta 370mdaily  - Reduce the gabapentin by 30064mvery 3-5 days with the goal of discontinuing the medication  due to weight gain  - Going forward, consider adding venlafaxine, or alpha-lipoic acid.  - I stressed that the goal is to reduce the amount of oxycodone, which she is slowly doing  Return to clinic in 4 months   The duration of this appointment visit was 25 minutes of face-to-face time with the patient.  Greater than 50% of this time was spent in counseling, explanation of diagnosis, planning of further management, and coordination of care.   Thank you for allowing me to participate in patient's care.  If I can answer any additional questions, I would be pleased to do so.    Sincerely,    Donika K. PatPosey ProntoO

## 2016-06-28 DIAGNOSIS — S93401A Sprain of unspecified ligament of right ankle, initial encounter: Secondary | ICD-10-CM | POA: Diagnosis not present

## 2016-06-28 DIAGNOSIS — Z6841 Body Mass Index (BMI) 40.0 and over, adult: Secondary | ICD-10-CM | POA: Diagnosis not present

## 2016-06-28 DIAGNOSIS — S82831A Other fracture of upper and lower end of right fibula, initial encounter for closed fracture: Secondary | ICD-10-CM | POA: Diagnosis not present

## 2016-06-30 DIAGNOSIS — S82434A Nondisplaced oblique fracture of shaft of right fibula, initial encounter for closed fracture: Secondary | ICD-10-CM | POA: Diagnosis not present

## 2016-08-23 ENCOUNTER — Encounter: Payer: Self-pay | Admitting: Neurology

## 2016-08-23 ENCOUNTER — Ambulatory Visit (INDEPENDENT_AMBULATORY_CARE_PROVIDER_SITE_OTHER): Payer: BLUE CROSS/BLUE SHIELD | Admitting: Neurology

## 2016-08-23 VITALS — BP 120/78 | HR 88 | Ht 63.0 in | Wt 253.0 lb

## 2016-08-23 DIAGNOSIS — E0842 Diabetes mellitus due to underlying condition with diabetic polyneuropathy: Secondary | ICD-10-CM | POA: Diagnosis not present

## 2016-08-23 DIAGNOSIS — Z79899 Other long term (current) drug therapy: Secondary | ICD-10-CM | POA: Diagnosis not present

## 2016-08-23 MED ORDER — GABAPENTIN 300 MG PO CAPS: ORAL_CAPSULE | ORAL | 11 refills | Status: DC

## 2016-08-23 NOTE — Progress Notes (Signed)
Follow-up Visit   Date: 08/23/16    Erin [name] MRN: [id number] DOB: 23-Feb-1956   Interim History: Erin [name] is a 60 y.o. right-handed Caucasian female with GERD, fibromyalgia, hypertension, depression, migraine, diabetes mellitus type 2, vitamin B12 deficiency, chronic pain syndrome returning to the clinic for follow-up of diabetic neuropathy.  The patient was accompanied to the clinic by self.  History of present illness: Starting around 2013, she developed sudden onset of numbness/tingling involving her right foot and within three months, she developed similar symptoms on the right. She also complains of burning sensation and intermittent stabbing pain over the arch of the feet.Discomfort is now at the level of mid-calf and involves her ankles and feet. She was started on Lyrica 190m TID which seems to alleviate some of the pain. She takes oxycodone 314mevery 4-5 hours. In the fall of 2015, she developed intermittent numbness/tingling of the hands which is worse in the morning.   She walks with a cane for balance. No recent falls. 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     She also seen podiatry and a chiropractor. She was told her spine was totally out of alignment and received laser therapy to her feet for three weeks, but felt even worse so stopped going.   UPDATE 03/11/2015:  Since increasing Lyrica to 20047mhree times daily, she has noticed improvement of her pain, because she no longer has stabbing pain.  She was able to reduce her oxycodone to 10m63m4 times per day, whereas previously she was taking 4-5.  She continues to have spells of hot/cold sensation, but is overall pleased with her improvement.    UPDATE 09/04/2015:  She was hospitalized in August due to leg swelling and shortness of breath.  She was treated for possible CHF and due to concern of Lyrica causing edema, it was reduced to Lyrica 50mg34mly.  She does not  notice any benefit with Lyrica 50mg 26mis interested in trying alternative medication.  She has lost 34lb since her admission.  For pain, she is back to taking oxycodone 4 times per day, which controls her pain.    UPDATE 12/24/2014:  She has noticed mild improvement in paresthesias since increasing gabapentin to 600mg T53m She gait is affected by her pain because she always feels as if she is walking on needles, but fortunately, she had not had any falls.     UPDATE 04/23/2016:  She unfortunately has gained weight with gabapentin so would like to discontinue it. Nortriptyline was stopped due to dry mouth.     UPDATE 08/23/2016:  She did not tolerate Cymbalta because it made her nauseous.  When she attempted tapering her gabapentin, she felt they her pain was worse so restarted this as she was taking 600-600-900mg.  39mcontinues to take oxycodone 10mg thr35mimes daily, which she was previously taking 6 times daily.  She has some tingling in the hands, and denies weakness, but is concerned that her neuropathy may be affecting her hands.   Medications:  Current Outpatient Prescriptions on File Prior to Visit  Medication Sig Dispense Refill  . benazepril-hydrochlorthiazide (LOTENSIN HCT) 20-25 MG per tablet Take 1 tablet by mouth daily.    . gabapenMarland Kitchenin (NEURONTIN) 300 MG capsule Take 2 tablets in the morning and afternoon.  Take 3 tablets at bedtime. 210 capsule 5  . levothyroxine (SYNTHROID, LEVOTHROID) 50 MCG tablet   10  . metformin (FORTAMET) 500 MG (  OSM) 24 hr tablet Take 500 mg by mouth daily with breakfast.    . omeprazole (PRILOSEC) 40 MG capsule Take 40 mg by mouth daily.    Marland Kitchen oxycodone (ROXICODONE) 30 MG immediate release tablet Take 30 mg by mouth every 4 (four) hours as needed for pain.    . SUMAtriptan (IMITREX) 100 MG tablet   9  . Vitamin D, Ergocalciferol, (DRISDOL) 50000 units CAPS capsule   10   No current facility-administered medications on file prior to visit.     Allergies:  No Known Allergies  Review of Systems:  CONSTITUTIONAL: No fevers, chills, night sweats, or weight loss.  EYES: No visual changes or eye pain ENT: No hearing changes.  No history of nose bleeds.   RESPIRATORY: No cough, wheezing and shortness of breath.   CARDIOVASCULAR: Negative for chest pain, and palpitations.   GI: Negative for abdominal discomfort, blood in stools or black stools.  No recent change in bowel habits.   GU:  No history of incontinence.   MUSCLOSKELETAL: No history of joint pain or swelling.  No myalgias.   SKIN: Negative for lesions, rash, and itching.   ENDOCRINE: Negative for cold or heat intolerance, polydipsia or goiter.   PSYCH:  No depression or anxiety symptoms.   NEURO: As Above.   Vital Signs:  BP 120/78   Pulse 88   Ht _0  (1.6 m)   Wt 253 lb (114.8 kg)   BMI 44.82 kg/m   Neurological Exam: MENTAL STATUS including orientation to time, place, person, recent and remote memory, attention span and concentration, language, and fund of knowledge is normal.  Speech is not dysarthric.  CRANIAL NERVES:  Pupils equal round and reactive to light.  Normal conjugate, extra-ocular eye movements in all directions of gaze.  No ptosis.  Face is symmetric.   MOTOR:  Motor strength is 5/5 in all extremities.    SENSORY: Vibration absent distal to ankles bilaterally.  COORDINATION/GAIT:  Gait is slightly wide-based due to body habitus  Data: Labs 12/16/2014:  ESR 45, vitamin B12 348, vitamin B6 6.5, copper 131, TSH 7.6, fT3 2.4, fT4 0.88, HbA1c 5.6  IMPRESSION: 1.  Peripheral neuropathy, likely idiopathic as her diabetes has been well-controlled  - Symptoms previously well-controlled on Lyrica 234m BID, but she developed bilateral leg edema so it was discontinued  - Previously tried:  Nortriptyline (dry mouth), Lyrica (bilateral leg edema), lidocaine ointment, Cymbalta (sick)  - Continue gabapentin 6066min the morning, 60053mn the afternoon, and 900m51m  bedtime  - She does not wish to try any other options at this time such as venlafaxine, or alpha-lipoic acid.  - I stressed that the goal is to reduce the amount of oxycodone, which she is slowly doing  2.  Bilateral hand paresthesias, ?worsening neuropathy vs CTS  - NCS/EMG of the upper extremities  Return to clinic in 6 months   The duration of this appointment visit was 25 minutes of face-to-face time with the patient.  Greater than 50% of this time was spent in counseling, explanation of diagnosis, planning of further management, and coordination of care.   Thank you for allowing me to participate in patient's care.  If I can answer any additional questions, I would be pleased to do so.    Sincerely,    Greogry Goodwyn K. PatePosey Pronto

## 2016-08-23 NOTE — Patient Instructions (Addendum)
1.  NCS/EMG of the upper extremities on November 7th at 3pm.  Please arrive 15 min prior to appointment time. 2.  Continue your gabapentin as you are taking it  Return to clinic in 6 month

## 2016-08-25 ENCOUNTER — Ambulatory Visit (INDEPENDENT_AMBULATORY_CARE_PROVIDER_SITE_OTHER): Payer: BLUE CROSS/BLUE SHIELD | Admitting: Orthopedic Surgery

## 2016-08-25 DIAGNOSIS — S82434D Nondisplaced oblique fracture of shaft of right fibula, subsequent encounter for closed fracture with routine healing: Secondary | ICD-10-CM | POA: Diagnosis not present

## 2016-08-26 DIAGNOSIS — E1149 Type 2 diabetes mellitus with other diabetic neurological complication: Secondary | ICD-10-CM | POA: Diagnosis not present

## 2016-08-26 DIAGNOSIS — G5600 Carpal tunnel syndrome, unspecified upper limb: Secondary | ICD-10-CM | POA: Diagnosis not present

## 2016-08-26 DIAGNOSIS — G629 Polyneuropathy, unspecified: Secondary | ICD-10-CM | POA: Diagnosis not present

## 2016-08-26 DIAGNOSIS — I10 Essential (primary) hypertension: Secondary | ICD-10-CM | POA: Diagnosis not present

## 2016-09-21 ENCOUNTER — Ambulatory Visit (INDEPENDENT_AMBULATORY_CARE_PROVIDER_SITE_OTHER): Payer: BLUE CROSS/BLUE SHIELD | Admitting: Neurology

## 2016-09-21 ENCOUNTER — Telehealth: Payer: Self-pay | Admitting: Neurology

## 2016-09-21 DIAGNOSIS — R209 Unspecified disturbances of skin sensation: Secondary | ICD-10-CM | POA: Diagnosis not present

## 2016-09-21 DIAGNOSIS — E0842 Diabetes mellitus due to underlying condition with diabetic polyneuropathy: Secondary | ICD-10-CM | POA: Diagnosis not present

## 2016-09-21 NOTE — Telephone Encounter (Signed)
Results of EMG discussed with patient which shows progression of neuropathy involving the right upper extremity. There is no evidence of carpal tunnel syndrome.  Donika K. Allena KatzPatel, DO

## 2016-09-21 NOTE — Procedures (Signed)
[facility]  [address], [address]  [place], [place] [address] Tel: [phone] Fax:  [phone] Test Date:  09/21/2016  Patient: Erin LabellaCarol [name] DOB: Sep 14, 1956 Physician: Nita Sickleonika Patel, DO  Sex: Female Height: 5\' 3"  Ref Phys: Nita Sickleonika Patel, DO  ID#: [id number] Temp: 37.0C Technician: Judie PetitM. Dean   Patient Complaints: This is a 60 year old female with diabetic neuropathy affecting the feet now presenting for evaluation of bilateral hand paresthesias.   NCV & EMG Findings: Extensive electrodiagnostic testing of the right upper extremity and additional studies of the left shows:  1. Right median sensory responses show reduced amplitude (R10.7, R7.2 V).  Median and ulnar sensory responses on the left as well as bilateral radial sensory responses are within normal limits. 2. Bilateral median and ulnar motor responses are within normal limits. 3. Chronic motor axon loss changes are seen affecting the right first dorsal interosseous, abductor digiti minimi, and abductor pollicis brevis muscles. These findings are not present in the left upper extremity.  Impression: The electrode diagnostic testing is most consistent with a chronic sensorimotor polyneuropathy, axon loss in type, affecting the right upper extremity. Overall, these findings are mild in degree electrically.   ___________________________ Nita Sickleonika Patel, DO    Nerve Conduction Studies Anti Sensory Summary Table   Site NR Peak (ms) Norm Peak (ms) P-T Amp (V) Norm P-T Amp  Left Median Anti Sensory (2nd Digit)  37C  Wrist    3.0 <3.6 16.5 >15  Right Median Anti Sensory (2nd Digit)  37C  Wrist    3.2 <3.6 10.7 >15  Left Radial Anti Sensory (Base 1st Digit)  37C  Wrist    2.1 <2.7 21.2 >14  Right Radial Anti Sensory (Base 1st Digit)  37C  Wrist    2.0 <2.7 22.9 >14  Left Ulnar Anti Sensory (5th Digit)  37C  Wrist    3.0 <3.1 12.6 >10  Right Ulnar Anti Sensory (5th Digit)  37C  Wrist    3.1 <3.1 7.2 >10     Motor Summary Table   Site NR Onset (ms) Norm Onset (ms) O-P Amp (mV) Norm O-P Amp Site1 Site2 Delta-0 (ms) Dist (cm) Vel (m/s) Norm Vel (m/s)  Left Median Motor (Abd Poll Brev)  37C  Wrist    3.1 <4.0 7.0 >6 Elbow Wrist 3.6 20.0 56 >50  Elbow    6.7  6.5         Right Median Motor (Abd Poll Brev)  37C  Wrist    3.4 <4.0 7.6 >6 Elbow Wrist 4.0 23.0 58 >50  Elbow    7.4  7.1         Left Ulnar Motor (Abd Dig Minimi)  37C  Wrist    2.7 <3.1 7.2 >7 B Elbow Wrist 3.0 20.0 67 >50  B Elbow    5.7  6.4  A Elbow B Elbow 1.4 10.0 71 >50  A Elbow    7.1  6.4         Right Ulnar Motor (Abd Dig Minimi)  37C  Wrist    2.8 <3.1 7.1 >7 B Elbow Wrist 3.5 21.0 60 >50  B Elbow    6.3  6.0  A Elbow B Elbow 1.5 10.0 67 >50  A Elbow    7.8  5.8          Comparison Summary Table   Site NR Peak (ms) Norm Peak (ms) P-T Amp (V) Site1 Site2 Delta-P (ms) Norm Delta (ms)  Left Median/Ulnar  Palm Comparison (Wrist - 8cm)  37C  Median Palm    1.8 <2.2 69.4 Median Palm Ulnar Palm 0.0   Ulnar Palm    1.8 <2.2 20.9       EMG   Side Muscle Ins Act Fibs Psw Fasc Number Recrt Dur Dur. Amp Amp. Poly Poly. Comment  Left 1stDorInt Nml Nml Nml Nml Nml Nml Nml Nml Nml Nml Nml Nml N/A  Left Abd Poll Brev Nml Nml Nml Nml Nml Nml Nml Nml Nml Nml Nml Nml N/A  Left FlexPolLong Nml Nml Nml Nml Nml Nml Nml Nml Nml Nml Nml Nml N/A  Left Ext Indicis Nml Nml Nml Nml Nml Nml Nml Nml Nml Nml Nml Nml N/A  Left ABD Dig Min Nml Nml Nml Nml Nml Nml Nml Nml Nml Nml Nml Nml N/A  Right 1stDorInt Nml Nml Nml Nml 1- Rapid Few 1+ Few 1+ Nml Nml N/A  Right Abd Poll Brev Nml Nml Nml Nml 1- Rapid Few 1+ Few 1+ Few 1+ N/A  Right FlexPolLong Nml Nml Nml Nml Nml Nml Nml Nml Nml Nml Nml Nml N/A  Right Ext Indicis Nml Nml Nml Nml Nml Nml Nml Nml Nml Nml Nml Nml N/A  Right Triceps Nml Nml Nml Nml Nml Nml Nml Nml Nml Nml Nml Nml N/A  Right ABD Dig Min Nml Nml Nml Nml 1- Rapid Some 1+ Some 1+ Nml Nml N/A      Waveforms:

## 2016-11-02 ENCOUNTER — Ambulatory Visit (INDEPENDENT_AMBULATORY_CARE_PROVIDER_SITE_OTHER): Payer: BLUE CROSS/BLUE SHIELD

## 2016-11-02 ENCOUNTER — Ambulatory Visit (INDEPENDENT_AMBULATORY_CARE_PROVIDER_SITE_OTHER): Payer: Self-pay

## 2016-11-02 ENCOUNTER — Encounter (INDEPENDENT_AMBULATORY_CARE_PROVIDER_SITE_OTHER): Payer: Self-pay | Admitting: Orthopedic Surgery

## 2016-11-02 ENCOUNTER — Ambulatory Visit (INDEPENDENT_AMBULATORY_CARE_PROVIDER_SITE_OTHER): Payer: BLUE CROSS/BLUE SHIELD | Admitting: Orthopedic Surgery

## 2016-11-02 ENCOUNTER — Encounter (INDEPENDENT_AMBULATORY_CARE_PROVIDER_SITE_OTHER): Payer: Self-pay

## 2016-11-02 DIAGNOSIS — M25571 Pain in right ankle and joints of right foot: Secondary | ICD-10-CM

## 2016-11-02 DIAGNOSIS — M1A09X0 Idiopathic chronic gout, multiple sites, without tophus (tophi): Secondary | ICD-10-CM

## 2016-11-02 MED ORDER — LIDOCAINE HCL 1 % IJ SOLN
2.0000 mL | INTRAMUSCULAR | Status: AC | PRN
  Administered 2016-11-02: 2 mL

## 2016-11-02 MED ORDER — METHYLPREDNISOLONE ACETATE 40 MG/ML IJ SUSP
40.0000 mg | INTRAMUSCULAR | Status: AC | PRN
  Administered 2016-11-02: 40 mg via INTRA_ARTICULAR

## 2016-11-02 NOTE — Progress Notes (Signed)
Office Visit Note   Patient: Erin [name]           Date of Birth: 31-Dec-1955           MRN: [id number] Visit Date: 11/02/2016              Requested by: Samuel Jesterynthia Butler, DO [address] [place], [place] [address] PCP: Samuel JesterYNTHIA BUTLER, DO   Assessment & Plan: Visit Diagnoses:  1. Idiopathic chronic gout of multiple sites without tophus   2. Pain in right ankle and joints of right foot   Patient's symptoms consistent with acute gout.  Plan: Right ankle injected without complications. Uric acid level drawn and we will call her with the results. Follow-up in 3 weeks. Anticipate we will need to start her on allopurinol and colchicine  Follow-Up Instructions: Return in about 3 weeks (around 11/23/2016).   Orders:  Orders Placed This Encounter  Procedures  . Medium Joint Injection/Arthrocentesis  . XR Ankle Complete Right  . XR Foot Complete Right   No orders of the defined types were placed in this encounter.     Procedures: Medium Joint Inj Date/Time: 11/02/2016 5:05 PM Performed by: Tyre Beaver V Authorized by: Nadara MustardUDA, Selda Jalbert V   Consent Given by:  Patient Site marked: the procedure site was marked   Timeout: prior to procedure the correct patient, procedure, and site was verified   Indications:  Pain and diagnostic evaluation Location:  Ankle Prep: patient was prepped and draped in usual sterile fashion   Needle Size:  22 G Needle Length:  1.5 inches Approach:  Anteromedial Ultrasound Guided: No   Fluoroscopic Guidance: No   Medications:  2 mL lidocaine 1 %; 40 mg methylPREDNISolone acetate 40 MG/ML Aspiration Attempted: No   Patient tolerance:  Patient tolerated the procedure well with no immediate complications     Clinical Data: No additional findings.   Subjective: Chief Complaint  Patient presents with  . Right Ankle - Pain    nondisplaced Weber A right fibular 06/24/16    Patient presents today for right foot and ankle pain, status post  nondisplaced Weber A right fibular 06/24/16. She cannot recall of a recent injury. She states she does have neuropathy and does not know if she had an injury or not. She has streaking that appeared just yesterday. She denies fever or nausea. She does have prominent swelling at ankle joint, pain radiates into the forefoot.    Review of Systems Patient reports a history of gout years ago where she took some colchicine for a short period of time but has not had this checked and has not taken medication for the gout.  Objective: Vital Signs: There were no vitals taken for this visit.  Physical Exam examination patient is alert oriented no not we will dress the left pectoral restorative efforts she does have an antalgic gait. Patient is globally tender to palpation around the right ankle. There is swelling there is no redness or cellulitis. Patient is not specifically tender to palpation over the lateral malleolus. The foot has some swelling and deformity but there is no acute redness no ulcerations no tophaceous deposits.  Ortho Exam  Specialty Comments:  No specialty comments available.  Imaging: Xr Ankle Complete Right  Result Date: 11/02/2016 Three-view radiographs of the right ankle shows a congruent mortise a old fracture of the lateral malleolus, Weber a. No acute changes.  Xr Foot Complete Right  Result Date: 11/02/2016 Three-view radiographs of the right foot  shows old previous stress fractures across the metatarsals. Patient has cystic changes through the midfoot which appears to be consistent with chronic gout. No acute Charcot fractures.    PMFS History: Patient Active Problem List   Diagnosis Date Noted  . 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 Essential hypertension 07/06/2015  . Fibromyalgia 07/06/2015  . Dyspnea 07/04/2015  . Diabetic neuropathy (HCC) 12/10/2014   Past Medical History:  Diagnosis Date  . Carpal tunnel  syndrome   . Chronic pain   . Depression   . Diabetes mellitus   . Fibromyalgia   . GERD (gastroesophageal reflux disease)   . Hernia   . Hypertension   . Miscarriage    twins  . Peripheral neuropathy (HCC)   . TMJ syndrome     Family History  Problem Relation Age of Onset  . Aneurysm Father     Deceased, 2782  . CAD Father   . Heart disease Father   . Cancer Father   . Pancreatic cancer Mother     Deceased, 2567    Past Surgical History:  Procedure Laterality Date  . ABDOMINAL SURGERY    . TONSILLECTOMY     Social History   Occupational History  . Not on file.   Social History Main Topics  . Smoking status: Former Games developermoker  . Smokeless tobacco: Never Used  . Alcohol use No  . Drug use: No  . Sexual activity: Not on file

## 2016-11-03 ENCOUNTER — Other Ambulatory Visit (INDEPENDENT_AMBULATORY_CARE_PROVIDER_SITE_OTHER): Payer: Self-pay

## 2016-11-03 LAB — URIC ACID: Uric Acid, Serum: 8.3 mg/dL — ABNORMAL HIGH (ref 2.5–7.0)

## 2016-11-03 MED ORDER — COLCHICINE 0.6 MG PO TABS: 0.6000 mg | ORAL_TABLET | Freq: Two times a day (BID) | ORAL | 3 refills | Status: DC

## 2016-11-03 MED ORDER — ALLOPURINOL 100 MG PO TABS: 100.0000 mg | ORAL_TABLET | Freq: Two times a day (BID) | ORAL | 3 refills | Status: DC

## 2016-11-03 NOTE — Progress Notes (Signed)
I called and lm on vm to advise pt of lab results and to call with questions, pt to call and make and appt if she does not already have one for one month so we can recheck labs. To call with questions

## 2016-11-23 ENCOUNTER — Ambulatory Visit (INDEPENDENT_AMBULATORY_CARE_PROVIDER_SITE_OTHER): Payer: BLUE CROSS/BLUE SHIELD

## 2016-11-23 ENCOUNTER — Ambulatory Visit (INDEPENDENT_AMBULATORY_CARE_PROVIDER_SITE_OTHER): Payer: BLUE CROSS/BLUE SHIELD | Admitting: Orthopedic Surgery

## 2016-11-23 DIAGNOSIS — M1A0710 Idiopathic chronic gout, right ankle and foot, without tophus (tophi): Secondary | ICD-10-CM | POA: Diagnosis not present

## 2016-11-23 DIAGNOSIS — M79671 Pain in right foot: Secondary | ICD-10-CM

## 2016-11-23 MED ORDER — PREDNISONE 10 MG PO TABS: 10.0000 mg | ORAL_TABLET | Freq: Every day | ORAL | 0 refills | Status: DC

## 2016-11-23 NOTE — Progress Notes (Addendum)
Office Visit Note   Patient: Erin MinionCarol F Otte           Date of Birth: 10/21/1956           MRN: [id number] Visit Date: 11/23/2016              Requested by: Samuel Jesterynthia Butler, DO [address] [place], [place] [address] PCP: Samuel JesterYNTHIA BUTLER, DO  No chief complaint on file.   HPI: Patient is here for follow up of her right ankle. S/p injection on 11/02/16. Patient states that the injection was not helpful with the pain. That the swelling is not as bad as it was but it is still just as painful.   Also uric acid level that date was  8.3. Patient is taking Allopurinol 100 mg bid and colcrys 0.6 mg bid.  Patient states that this has not helped the pain. Repeat labs obtained today Rodena MedinAutumn L Forrest, RMA    Assessment & Plan: Visit Diagnoses:  1. Pain in right foot   2. Idiopathic chronic gout of right ankle without tophus     Plan: Patient will just use the L. I'll and stop the call Thayer OhmChris at this time. She'll use a copious for acute flareups. Discussed the importance of proper fluid intake and she will increase her water intake. We will start her on a low-dose prednisone to see if this will help with the inflammation in the right ankle. Radiographs are negative for any Charcot arthropathy no acute fractures or joint space abnormality. We will call her with results of the uric acid level drawn today and adjust the allopurinol as needed.  Follow-Up Instructions: Return in about 3 weeks (around 12/14/2016).   Ortho Exam On examination patient is alert oriented no adenopathy well-dressed normal affect normal respiratory effort she does have an antalgic gait she still has venous stasis swelling and discussed the importance of wearing the medical compression socks daily. She has a palpable dorsalis pedis pulse. She has pain to palpation anteriorly over the ankle there is no redness no cellulitis no signs of infection. X-rays showed no evidence of acute Charcot arthropathy.  Imaging: Xr Ankle 2 Views  Right  Result Date: 11/23/2016 2 view radiographs of the right ankle shows some shortening of the fibula most likely from a previous fracture there is no joint space narrowing no Charcot arthropathy.   Orders:  Orders Placed This Encounter  Procedures  . XR Ankle 2 Views Right  . Uric acid   Meds ordered this encounter  Medications  . predniSONE (DELTASONE) 10 MG tablet    Sig: Take 1 tablet (10 mg total) by mouth daily with breakfast. At 2 weeks decreased to half tablet a day    Dispense:  30 tablet    Refill:  0     Procedures: No procedures performed  Clinical Data: No additional findings.  Subjective: Review of Systems  Objective: Vital Signs: There were no vitals taken for this visit.  Specialty Comments:  No specialty comments available.  PMFS History: Patient Active Problem List   Diagnosis Date Noted  . Idiopathic chronic gout of right ankle without tophus 11/23/2016  . Pain in right foot 11/23/2016  . 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 Essential hypertension 07/06/2015  . Fibromyalgia 07/06/2015  . Dyspnea 07/04/2015  . Diabetic neuropathy (HCC) 12/10/2014   Past Medical History:  Diagnosis Date  . Carpal tunnel syndrome   . Chronic pain   . Depression   .  Diabetes mellitus   . Fibromyalgia   . GERD (gastroesophageal reflux disease)   . Hernia   . Hypertension   . Miscarriage    twins  . Peripheral neuropathy (HCC)   . TMJ syndrome     Family History  Problem Relation Age of Onset  . Aneurysm Father     Deceased, 52  . CAD Father   . Heart disease Father   . Cancer Father   . Pancreatic cancer Mother     Deceased, 33    Past Surgical History:  Procedure Laterality Date  . ABDOMINAL SURGERY    . TONSILLECTOMY     Social History   Occupational History  . Not on file.   Social History Main Topics  . Smoking status: Former Games developer  . Smokeless tobacco: Never Used  . Alcohol use No  .  Drug use: No  . Sexual activity: Not on file

## 2016-11-24 LAB — URIC ACID: URIC ACID, SERUM: 6.5 mg/dL (ref 2.5–7.0)

## 2016-12-14 ENCOUNTER — Ambulatory Visit (INDEPENDENT_AMBULATORY_CARE_PROVIDER_SITE_OTHER): Payer: BLUE CROSS/BLUE SHIELD | Admitting: Orthopedic Surgery

## 2016-12-14 DIAGNOSIS — M25571 Pain in right ankle and joints of right foot: Secondary | ICD-10-CM

## 2016-12-14 DIAGNOSIS — E1142 Type 2 diabetes mellitus with diabetic polyneuropathy: Secondary | ICD-10-CM

## 2016-12-14 DIAGNOSIS — M1A0710 Idiopathic chronic gout, right ankle and foot, without tophus (tophi): Secondary | ICD-10-CM

## 2016-12-14 MED ORDER — LIDOCAINE HCL 1 % IJ SOLN
2.0000 mL | INTRAMUSCULAR | Status: AC | PRN
  Administered 2016-12-14: 2 mL

## 2016-12-14 MED ORDER — METHYLPREDNISOLONE ACETATE 40 MG/ML IJ SUSP
40.0000 mg | INTRAMUSCULAR | Status: AC | PRN
  Administered 2016-12-14: 40 mg via INTRA_ARTICULAR

## 2016-12-14 NOTE — Progress Notes (Signed)
Office Visit Note   Patient: Erin [name]           Date of Birth: [birth year]           MRN: [id number] Visit Date: 12/14/2016              Requested by: Samuel Jester, DO [address] [place], [place] [address] PCP: Samuel Jester, DO  No chief complaint on file.   HPI: Pt in today for re-check of her right foot. She states that the compression hose is helping a little with the swelling and that she wears these everyday but it is still not 100% resolved. The same for the pain she states that she is taking her allopurinol as directed and at last check her uric acid level was 6.5. She has completed her pred taper and states that this has helped the pain slightly but she still had quite a bit of discomfort and states that this is different from the neuropathic pain that she has.  Rodena Medin, RMA    Assessment & Plan: Visit Diagnoses:  1. Diabetic polyneuropathy associated with type 2 diabetes mellitus (HCC)   2. Idiopathic chronic gout of right ankle without tophus     Plan: Right ankle injected from the anterior medial portal without complications. Follow-up in 4 weeks' continue with heel cord stretching continue with compression stockings continue with allopurinol  Follow-Up Instructions: Return in about 4 weeks (around 01/11/2017).   Ortho Exam Examination patient is alert oriented no adenopathy well-dressed normal affect, respiratory effort she has an antalgic gait she has dorsiflexion to neutral she has a good pulse she does have pitting edema with venous insufficiency she is maximally tender to palpation over the anterior medial anterolateral joint line of the ankle peroneal posterior tibial tendon are minimally tender to palpation the Achilles tendon is nontender to palpation. There is no redness no cellulitis no signs of acute gout or infection.  Imaging: No results found.  Orders:  No orders of the defined types were placed in this encounter.  No orders of the  defined types were placed in this encounter.    Procedures: Medium Joint Inj Date/Time: 12/14/2016 4:49 PM Performed by: Leanza Shepperson V Authorized by: Nadara Mustard   Consent Given by:  Patient Site marked: the procedure site was marked   Timeout: prior to procedure the correct patient, procedure, and site was verified   Indications:  Pain and diagnostic evaluation Location:  Ankle Site:  R ankle Prep: patient was prepped and draped in usual sterile fashion   Needle Size:  22 G Needle Length:  1.5 inches Approach:  Anteromedial Ultrasound Guided: No   Fluoroscopic Guidance: No   Medications:  2 mL lidocaine 1 %; 40 mg methylPREDNISolone acetate 40 MG/ML Aspiration Attempted: No   Patient tolerance:  Patient tolerated the procedure well with no immediate complications    Clinical Data: No additional findings.  Subjective: Review of Systems  Objective: Vital Signs: There were no vitals taken for this visit.  Specialty Comments:  No specialty comments available.  PMFS History: Patient Active Problem List   Diagnosis Date Noted  . Idiopathic chronic gout of right ankle without tophus 11/23/2016  . Pain in right foot 11/23/2016  . 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 Essential hypertension 07/06/2015  . Fibromyalgia 07/06/2015  . Dyspnea 07/04/2015  . Diabetic neuropathy (HCC) 12/10/2014   Past Medical History:  Diagnosis Date  . Carpal tunnel  syndrome   . Chronic pain   . Depression   . Diabetes mellitus   . Fibromyalgia   . GERD (gastroesophageal reflux disease)   . Hernia   . Hypertension   . Miscarriage    twins  . Peripheral neuropathy (HCC)   . TMJ syndrome     Family History  Problem Relation Age of Onset  . Aneurysm Father     Deceased, 1682  . CAD Father   . Heart disease Father   . Cancer Father   . Pancreatic cancer Mother     Deceased, 7767    Past Surgical History:  Procedure Laterality Date  .  ABDOMINAL SURGERY    . TONSILLECTOMY     Social History   Occupational History  . Not on file.   Social History Main Topics  . Smoking status: Former Games developermoker  . Smokeless tobacco: Never Used  . Alcohol use No  . Drug use: No  . Sexual activity: Not on file

## 2016-12-27 ENCOUNTER — Encounter (HOSPITAL_COMMUNITY): Payer: Self-pay | Admitting: Emergency Medicine

## 2016-12-27 ENCOUNTER — Observation Stay (HOSPITAL_COMMUNITY)
Admission: EM | Admit: 2016-12-27 | Discharge: 2016-12-28 | Disposition: A | Discharge status: Home / Self Care | Payer: BLUE CROSS/BLUE SHIELD | Attending: Student in an Organized Health Care Education/Training Program | Admitting: Student in an Organized Health Care Education/Training Program

## 2016-12-27 ENCOUNTER — Emergency Department (HOSPITAL_COMMUNITY): Payer: BLUE CROSS/BLUE SHIELD

## 2016-12-27 DIAGNOSIS — R0902 Hypoxemia: Secondary | ICD-10-CM | POA: Diagnosis present

## 2016-12-27 DIAGNOSIS — N183 Chronic kidney disease, stage 3 unspecified: Secondary | ICD-10-CM

## 2016-12-27 DIAGNOSIS — R062 Wheezing: Secondary | ICD-10-CM

## 2016-12-27 DIAGNOSIS — Z79899 Other long term (current) drug therapy: Secondary | ICD-10-CM | POA: Diagnosis not present

## 2016-12-27 DIAGNOSIS — R05 Cough: Secondary | ICD-10-CM | POA: Insufficient documentation

## 2016-12-27 DIAGNOSIS — J111 Influenza due to unidentified influenza virus with other respiratory manifestations: Secondary | ICD-10-CM | POA: Diagnosis present

## 2016-12-27 DIAGNOSIS — Z87891 Personal history of nicotine dependence: Secondary | ICD-10-CM | POA: Insufficient documentation

## 2016-12-27 DIAGNOSIS — J9601 Acute respiratory failure with hypoxia: Secondary | ICD-10-CM | POA: Diagnosis not present

## 2016-12-27 DIAGNOSIS — I10 Essential (primary) hypertension: Secondary | ICD-10-CM | POA: Diagnosis not present

## 2016-12-27 DIAGNOSIS — N179 Acute kidney failure, unspecified: Secondary | ICD-10-CM | POA: Diagnosis present

## 2016-12-27 DIAGNOSIS — Z23 Encounter for immunization: Secondary | ICD-10-CM | POA: Insufficient documentation

## 2016-12-27 DIAGNOSIS — R0602 Shortness of breath: Secondary | ICD-10-CM | POA: Diagnosis present

## 2016-12-27 DIAGNOSIS — E1140 Type 2 diabetes mellitus with diabetic neuropathy, unspecified: Secondary | ICD-10-CM | POA: Insufficient documentation

## 2016-12-27 DIAGNOSIS — Z7984 Long term (current) use of oral hypoglycemic drugs: Secondary | ICD-10-CM | POA: Diagnosis not present

## 2016-12-27 DIAGNOSIS — R059 Cough, unspecified: Secondary | ICD-10-CM

## 2016-12-27 LAB — BASIC METABOLIC PANEL
ANION GAP: 13 (ref 5–15)
BUN: 21 mg/dL — ABNORMAL HIGH (ref 6–20)
CALCIUM: 8.7 mg/dL — AB (ref 8.9–10.3)
CO2: 27 mmol/L (ref 22–32)
CREATININE: 1.5 mg/dL — AB (ref 0.44–1.00)
Chloride: 91 mmol/L — ABNORMAL LOW (ref 101–111)
GFR calc Af Amer: 43 mL/min — ABNORMAL LOW (ref >60)
GFR calc non Af Amer: 37 mL/min — ABNORMAL LOW (ref >60)
GLUCOSE: 126 mg/dL — AB (ref 65–99)
Potassium: 4.6 mmol/L (ref 3.5–5.1)
Sodium: 131 mmol/L — ABNORMAL LOW (ref 135–145)

## 2016-12-27 LAB — CBC WITH DIFFERENTIAL/PLATELET
BASOS ABS: 0 10*3/uL (ref 0.0–0.1)
BASOS PCT: 0 %
EOS PCT: 1 %
Eosinophils Absolute: 0.1 10*3/uL (ref 0.0–0.7)
HCT: 30 % — ABNORMAL LOW (ref 36.0–46.0)
Hemoglobin: 9 g/dL — ABNORMAL LOW (ref 12.0–15.0)
LYMPHS PCT: 12 %
Lymphs Abs: 0.9 10*3/uL (ref 0.7–4.0)
MCH: 27.5 pg (ref 26.0–34.0)
MCHC: 30 g/dL (ref 30.0–36.0)
MCV: 91.7 fL (ref 78.0–100.0)
MONO ABS: 0.5 10*3/uL (ref 0.1–1.0)
Monocytes Relative: 6 %
Neutro Abs: 6 10*3/uL (ref 1.7–7.7)
Neutrophils Relative %: 81 %
Platelets: 315 10*3/uL (ref 150–400)
RBC: 3.27 MIL/uL — ABNORMAL LOW (ref 3.87–5.11)
RDW: 15.3 % (ref 11.5–15.5)
WBC: 7.4 10*3/uL (ref 4.0–10.5)

## 2016-12-27 LAB — I-STAT TROPONIN, ED: Troponin i, poc: 0.07 ng/mL (ref 0.00–0.08)

## 2016-12-27 LAB — INFLUENZA PANEL BY PCR (TYPE A & B)
Influenza A By PCR: POSITIVE — AB
Influenza B By PCR: NEGATIVE

## 2016-12-27 LAB — GLUCOSE, CAPILLARY
Glucose-Capillary: 252 mg/dL — ABNORMAL HIGH (ref 65–99)
Glucose-Capillary: 205 mg/dL — ABNORMAL HIGH (ref 65–99)

## 2016-12-27 LAB — BRAIN NATRIURETIC PEPTIDE: B NATRIURETIC PEPTIDE 5: 190.3 pg/mL — AB (ref 0.0–100.0)

## 2016-12-27 MED ORDER — ORAL CARE MOUTH RINSE
15.0000 mL | Freq: Two times a day (BID) | OROMUCOSAL | Status: DC
  Administered 2016-12-27: 15 mL via OROMUCOSAL

## 2016-12-27 MED ORDER — LEVOTHYROXINE SODIUM 50 MCG PO TABS
50.0000 ug | ORAL_TABLET | Freq: Every day | ORAL | Status: DC
  Administered 2016-12-28: 50 ug via ORAL
  Filled 2016-12-27: qty 1

## 2016-12-27 MED ORDER — ALBUTEROL (5 MG/ML) CONTINUOUS INHALATION SOLN
10.0000 mg/h | INHALATION_SOLUTION | RESPIRATORY_TRACT | Status: DC
  Administered 2016-12-27: 10 mg/h via RESPIRATORY_TRACT
  Filled 2016-12-27: qty 20

## 2016-12-27 MED ORDER — HYDROCHLOROTHIAZIDE 25 MG PO TABS: 25.0000 mg | ORAL_TABLET | Freq: Every day | ORAL | Status: DC

## 2016-12-27 MED ORDER — ALBUTEROL SULFATE (2.5 MG/3ML) 0.083% IN NEBU
5.0000 mg | INHALATION_SOLUTION | Freq: Once | RESPIRATORY_TRACT | Status: AC
  Administered 2016-12-27: 5 mg via RESPIRATORY_TRACT
  Filled 2016-12-27: qty 6

## 2016-12-27 MED ORDER — GABAPENTIN 300 MG PO CAPS
600.0000 mg | ORAL_CAPSULE | Freq: Three times a day (TID) | ORAL | Status: DC
  Administered 2016-12-27: 600 mg via ORAL
  Filled 2016-12-27: qty 2

## 2016-12-27 MED ORDER — ALBUTEROL SULFATE (2.5 MG/3ML) 0.083% IN NEBU
5.0000 mg | INHALATION_SOLUTION | Freq: Once | RESPIRATORY_TRACT | Status: AC
Start: 2016-12-27 — End: 2016-12-27
  Administered 2016-12-27: 5 mg via RESPIRATORY_TRACT
  Filled 2016-12-27: qty 6

## 2016-12-27 MED ORDER — IPRATROPIUM-ALBUTEROL 0.5-2.5 (3) MG/3ML IN SOLN
3.0000 mL | Freq: Four times a day (QID) | RESPIRATORY_TRACT | Status: DC
  Administered 2016-12-27 – 2016-12-28 (×3): 3 mL via RESPIRATORY_TRACT
  Filled 2016-12-27 (×4): qty 3

## 2016-12-27 MED ORDER — OXYCODONE HCL 5 MG PO TABS
30.0000 mg | ORAL_TABLET | Freq: Four times a day (QID) | ORAL | Status: DC | PRN
  Administered 2016-12-27 – 2016-12-28 (×4): 30 mg via ORAL
  Filled 2016-12-27 (×4): qty 6

## 2016-12-27 MED ORDER — PREDNISONE 20 MG PO TABS
40.0000 mg | ORAL_TABLET | Freq: Every day | ORAL | Status: DC
  Administered 2016-12-28: 40 mg via ORAL
  Filled 2016-12-27: qty 2

## 2016-12-27 MED ORDER — ALLOPURINOL 100 MG PO TABS
100.0000 mg | ORAL_TABLET | Freq: Two times a day (BID) | ORAL | Status: DC
  Administered 2016-12-27 – 2016-12-28 (×2): 100 mg via ORAL
  Filled 2016-12-27 (×2): qty 1

## 2016-12-27 MED ORDER — SENNA 8.6 MG PO TABS
1.0000 | ORAL_TABLET | Freq: Two times a day (BID) | ORAL | Status: DC
  Administered 2016-12-27 – 2016-12-28 (×2): 8.6 mg via ORAL
  Filled 2016-12-27 (×2): qty 1

## 2016-12-27 MED ORDER — INSULIN ASPART 100 UNIT/ML ~~LOC~~ SOLN: 0.0000 [IU] | Freq: Three times a day (TID) | SUBCUTANEOUS | Status: DC

## 2016-12-27 MED ORDER — SODIUM CHLORIDE 0.9% FLUSH: 3.0000 mL | INTRAVENOUS | Status: DC | PRN

## 2016-12-27 MED ORDER — ALBUTEROL SULFATE (2.5 MG/3ML) 0.083% IN NEBU: 2.5000 mg | INHALATION_SOLUTION | RESPIRATORY_TRACT | Status: DC | PRN

## 2016-12-27 MED ORDER — PNEUMOCOCCAL VAC POLYVALENT 25 MCG/0.5ML IJ INJ
0.5000 mL | INJECTION | INTRAMUSCULAR | Status: AC
  Administered 2016-12-28: 0.5 mL via INTRAMUSCULAR
  Filled 2016-12-27: qty 0.5

## 2016-12-27 MED ORDER — PANTOPRAZOLE SODIUM 40 MG PO TBEC
40.0000 mg | DELAYED_RELEASE_TABLET | Freq: Every day | ORAL | Status: DC
  Administered 2016-12-28: 40 mg via ORAL
  Filled 2016-12-27: qty 1

## 2016-12-27 MED ORDER — FUROSEMIDE 40 MG PO TABS
40.0000 mg | ORAL_TABLET | Freq: Every day | ORAL | Status: DC
  Administered 2016-12-27: 40 mg via ORAL
  Filled 2016-12-27 (×2): qty 1

## 2016-12-27 MED ORDER — INSULIN ASPART 100 UNIT/ML ~~LOC~~ SOLN
0.0000 [IU] | Freq: Every day | SUBCUTANEOUS | Status: DC
  Administered 2016-12-27: 3 [IU] via SUBCUTANEOUS

## 2016-12-27 MED ORDER — METHYLPREDNISOLONE SODIUM SUCC 125 MG IJ SOLR
125.0000 mg | Freq: Once | INTRAMUSCULAR | Status: AC
  Administered 2016-12-27: 125 mg via INTRAVENOUS
  Filled 2016-12-27: qty 2

## 2016-12-27 MED ORDER — IPRATROPIUM BROMIDE 0.02 % IN SOLN
0.5000 mg | Freq: Once | RESPIRATORY_TRACT | Status: AC
  Administered 2016-12-27: 0.5 mg via RESPIRATORY_TRACT
  Filled 2016-12-27: qty 2.5

## 2016-12-27 MED ORDER — BENAZEPRIL HCL 40 MG PO TABS
20.0000 mg | ORAL_TABLET | Freq: Every day | ORAL | Status: DC
  Filled 2016-12-27: qty 1

## 2016-12-27 MED ORDER — OSELTAMIVIR PHOSPHATE 30 MG PO CAPS
30.0000 mg | ORAL_CAPSULE | Freq: Two times a day (BID) | ORAL | Status: DC
  Administered 2016-12-27: 30 mg via ORAL
  Filled 2016-12-27 (×2): qty 1

## 2016-12-27 MED ORDER — SODIUM CHLORIDE 0.9 % IV SOLN: 250.0000 mL | INTRAVENOUS | Status: DC | PRN

## 2016-12-27 MED ORDER — ALBUTEROL SULFATE (2.5 MG/3ML) 0.083% IN NEBU
INHALATION_SOLUTION | RESPIRATORY_TRACT | Status: AC
  Filled 2016-12-27: qty 3

## 2016-12-27 MED ORDER — BENAZEPRIL-HYDROCHLOROTHIAZIDE 20-25 MG PO TABS: 1.0000 | ORAL_TABLET | Freq: Every day | ORAL | Status: DC

## 2016-12-27 MED ORDER — ENOXAPARIN SODIUM 40 MG/0.4ML ~~LOC~~ SOLN
40.0000 mg | SUBCUTANEOUS | Status: DC
  Administered 2016-12-27: 40 mg via SUBCUTANEOUS
  Filled 2016-12-27: qty 0.4

## 2016-12-27 MED ORDER — SODIUM CHLORIDE 0.9% FLUSH
3.0000 mL | Freq: Two times a day (BID) | INTRAVENOUS | Status: DC
  Administered 2016-12-27 – 2016-12-28 (×2): 3 mL via INTRAVENOUS

## 2016-12-27 MED ORDER — INFLUENZA VAC SPLIT QUAD 0.5 ML IM SUSY
0.5000 mL | PREFILLED_SYRINGE | INTRAMUSCULAR | Status: AC
  Administered 2016-12-28: 0.5 mL via INTRAMUSCULAR
  Filled 2016-12-27: qty 0.5

## 2016-12-27 MED ORDER — IPRATROPIUM-ALBUTEROL 0.5-2.5 (3) MG/3ML IN SOLN: 3.0000 mL | Freq: Four times a day (QID) | RESPIRATORY_TRACT | Status: DC | PRN

## 2016-12-27 MED ORDER — INSULIN ASPART 100 UNIT/ML ~~LOC~~ SOLN
0.0000 [IU] | Freq: Three times a day (TID) | SUBCUTANEOUS | Status: DC
  Administered 2016-12-27: 5 [IU] via SUBCUTANEOUS
  Administered 2016-12-28: 1 [IU] via SUBCUTANEOUS

## 2016-12-27 MED ORDER — ACETAMINOPHEN 325 MG PO TABS
650.0000 mg | ORAL_TABLET | Freq: Four times a day (QID) | ORAL | Status: DC | PRN
  Administered 2016-12-27 – 2016-12-28 (×2): 650 mg via ORAL
  Filled 2016-12-27 (×2): qty 2

## 2016-12-27 MED ORDER — ACETAMINOPHEN 650 MG RE SUPP: 650.0000 mg | Freq: Four times a day (QID) | RECTAL | Status: DC | PRN

## 2016-12-27 NOTE — ED Provider Notes (Signed)
[facility]-EMERGENCY DEPT Provider Note   CSN: [id number] Arrival date & time: 12/27/16  4098     History   Chief Complaint Chief Complaint  Patient presents with  . Shortness of Breath    HPI Erin [name] is a 61 y.o. female.  The history is provided by the patient and medical records.  Shortness of Breath  Associated symptoms include cough and wheezing.    61 year old female with history of fibromyalgia and chronic pain, depression, diabetes, GERD, hypertension, peripheral neuropathy, presenting to the ED for shortness of breath. Patient reports for the past 2 days she has had nasal congestion and dry cough. She does report some sinus pressure. She denies any chest pain. States this morning breathing became more labored so she decided to come here. She has no history of asthma, COPD, or other respiratory issues. She is a former smoker. No known cardiac history. States she has never had to use inhalers before. She reports she did attend a basketball game over the weekend and came in contact with a few sick people. She did not receive a flu vaccination this year. She denies any fever, chills, body aches, nausea, or vomiting. Has been able to eat and drink normally. No LE edema or calf pain. No hx of DVT or PE.  Patient's O2 sat was 90% on room air upon arrival. She does not generally use home oxygen.  She's not had any medications prior to arrival.  Past Medical History:  Diagnosis Date  . Carpal tunnel syndrome   . Chronic pain   . Depression   . Diabetes mellitus   . Fibromyalgia   . GERD (gastroesophageal reflux disease)   . Hernia   . Hypertension   . Miscarriage    twins  . Peripheral neuropathy (HCC)   . TMJ syndrome     Patient Active Problem List   Diagnosis Date Noted  . Idiopathic chronic gout of right ankle without tophus 11/23/2016  . Pain in right foot 11/23/2016  . 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 Essential  hypertension 07/06/2015  . Fibromyalgia 07/06/2015  . Dyspnea 07/04/2015  . Diabetic neuropathy (HCC) 12/10/2014    Past Surgical History:  Procedure Laterality Date  . ABDOMINAL SURGERY    . TONSILLECTOMY      OB History    No data available       Home Medications    Prior to Admission medications   Medication Sig Start Date End Date Taking? Authorizing Provider  allopurinol (ZYLOPRIM) 100 MG tablet Take 1 tablet (100 mg total) by mouth 2 (two) times daily. 11/03/16  Yes Nadara Mustard, MD  benazepril-hydrochlorthiazide (LOTENSIN HCT) 20-25 MG per tablet Take 1 tablet by mouth daily.   Yes Historical Provider, MD  gabapentin (NEURONTIN) 300 MG capsule Take 2 tablets in the morning and afternoon.  Take 3 tablets at bedtime. Patient taking differently: Take 600-900 mg by mouth 3 (three) times daily. Take 2 tablets in the morning and afternoon.  Take 3 tablets at bedtime. 08/23/16  Yes Donika Concha Se, DO  levothyroxine (SYNTHROID, LEVOTHROID) 50 MCG tablet Take 50 mcg by mouth daily before breakfast.  08/15/15  Yes Historical Provider, MD  metformin (FORTAMET) 500 MG (OSM) 24 hr tablet Take 500 mg by mouth daily with breakfast.   Yes Historical Provider, MD  omeprazole (PRILOSEC) 40 MG capsule Take 40 mg by mouth daily.   Yes Historical Provider, MD  oxycodone (ROXICODONE) 30 MG immediate release tablet  Take 30 mg by mouth every 4 (four) hours as needed for pain.   Yes Historical Provider, MD  SUMAtriptan (IMITREX) 100 MG tablet Take 100 mg by mouth every 2 (two) hours as needed for migraine.  08/15/15  Yes Historical Provider, MD  colchicine 0.6 MG tablet Take 1 tablet (0.6 mg total) by mouth 2 (two) times daily. Twice daily until gout flare resolves Patient not taking: Reported on 12/27/2016 11/03/16   Nadara Mustard, MD    Family History Family History  Problem Relation Age of Onset  . Aneurysm Father     Deceased, 35  . CAD Father   . Heart disease Father   . Cancer Father   .  Pancreatic cancer Mother     Deceased, 13    Social History Social History  Substance Use Topics  . Smoking status: Former Games developer  . Smokeless tobacco: Never Used  . Alcohol use No     Allergies   Patient has no known allergies.   Review of Systems Review of Systems  HENT: Positive for congestion.   Respiratory: Positive for cough, shortness of breath and wheezing.   All other systems reviewed and are negative.    Physical Exam Updated Vital Signs BP 108/57 (BP Location: Left Arm)   Pulse 79   Temp 98.7 F (37.1 C) (Oral)   Resp 18   Ht 5\' 3"  (1.6 m)   Wt 113.4 kg   SpO2 95%   BMI 44.29 kg/m   Physical Exam  Constitutional: She is oriented to person, place, and time. She appears well-developed and well-nourished.  HENT:  Head: Normocephalic and atraumatic.  Right Ear: Tympanic membrane and ear canal normal.  Left Ear: Tympanic membrane and ear canal normal.  Nose: Mucosal edema present.  Mouth/Throat: Uvula is midline, oropharynx is clear and moist and mucous membranes are normal.  + nasal congestion with PND  Eyes: Conjunctivae and EOM are normal. Pupils are equal, round, and reactive to light.  Neck: Normal range of motion.  Cardiovascular: Normal rate, regular rhythm and normal heart sounds.   Pulmonary/Chest: Effort normal. She has wheezes.  Intermixed expiratory wheezes throughout, able to speak in short sentences, O2 sats 93% during exam on 2L supplemental O2  Abdominal: Soft. Bowel sounds are normal.  Musculoskeletal: Normal range of motion.  Neurological: She is alert and oriented to person, place, and time.  Skin: Skin is warm and dry.  Psychiatric: She has a normal mood and affect.  Nursing note and vitals reviewed.    ED Treatments / Results  Labs (all labs ordered are listed, but only abnormal results are displayed) Labs Reviewed  CBC WITH DIFFERENTIAL/PLATELET - Abnormal; Notable for the following:       Result Value   RBC 3.27 (*)     Hemoglobin 9.0 (*)    HCT 30.0 (*)    All other components within normal limits  BASIC METABOLIC PANEL - Abnormal; Notable for the following:    Sodium 131 (*)    Chloride 91 (*)    Glucose, Bld 126 (*)    BUN 21 (*)    Creatinine, Ser 1.50 (*)    Calcium 8.7 (*)    GFR calc non Af Amer 37 (*)    GFR calc Af Amer 43 (*)    All other components within normal limits  BRAIN NATRIURETIC PEPTIDE - Abnormal; Notable for the following:    B Natriuretic Peptide 190.3 (*)    All other components within  normal limits  I-STAT TROPOININ, ED    EKG  EKG Interpretation  Date/Time:  Monday December 27 2016 09:10:28 EST Ventricular Rate:  83 PR Interval:  176 QRS Duration: 62 QT Interval:  338 QTC Calculation: 397 R Axis:   54 Text Interpretation:  Normal sinus rhythm Normal ECG Confirmed by Rubin Payor  MD, NATHAN [phone]) on 12/27/2016 9:28:43 AM       Radiology Dg Chest 2 View  Result Date: 12/27/2016 CLINICAL DATA:  Shortness of breath. EXAM: CHEST  2 VIEW COMPARISON:  07/03/2015 . FINDINGS: Mediastinum hilar structures are normal. Low lung volumes with mild basilar atelectasis. No pleural effusion or pneumothorax. Heart size normal. Degenerative changes thoracic spine with thoracic spine scoliosis noted IMPRESSION: Low lung volumes with mild basilar atelectasis. Electronically Signed   By: Maisie Fus  Register   On: 12/27/2016 10:58    Procedures Procedures (including critical care time)  CRITICAL CARE Performed by: Garlon Hatchet   Total critical care time: 50 minutes  Critical care time was exclusive of separately billable procedures and treating other patients.  Critical care was necessary to treat or prevent imminent or life-threatening deterioration.  Critical care was time spent personally by me on the following activities: development of treatment plan with patient and/or surrogate as well as nursing, discussions with consultants, evaluation of patient's response to  treatment, examination of patient, obtaining history from patient or surrogate, ordering and performing treatments and interventions, ordering and review of laboratory studies, ordering and review of radiographic studies, pulse oximetry and re-evaluation of patient's condition.   Medications Ordered in ED Medications  albuterol (PROVENTIL) (2.5 MG/3ML) 0.083% nebulizer solution (  Canceled Entry 12/27/16 0956)  albuterol (PROVENTIL,VENTOLIN) solution continuous neb (10 mg/hr Nebulization New Bag/Given 12/27/16 1235)  albuterol (PROVENTIL) (2.5 MG/3ML) 0.083% nebulizer solution 5 mg (5 mg Nebulization Given 12/27/16 0940)  ipratropium (ATROVENT) nebulizer solution 0.5 mg (0.5 mg Nebulization Given 12/27/16 0940)  methylPREDNISolone sodium succinate (SOLU-MEDROL) 125 mg/2 mL injection 125 mg (125 mg Intravenous Given 12/27/16 1043)  albuterol (PROVENTIL) (2.5 MG/3ML) 0.083% nebulizer solution 5 mg (5 mg Nebulization Given 12/27/16 1131)  ipratropium (ATROVENT) nebulizer solution 0.5 mg (0.5 mg Nebulization Given 12/27/16 1131)     Initial Impression / Assessment and Plan / ED Course  I have reviewed the triage vital signs and the nursing notes.  Pertinent labs & imaging results that were available during my care of the patient were reviewed by me and considered in my medical decision making (see chart for details).  61 year old female here with shortness of breath 2 days. Also reports cough and nasal congestion. No body aches, fever, or chills. She is afebrile and nontoxic in appearance here. On exam she has slightly increased work of breathing with diffuse expiratory wheezes. She is able to speak in short sentences and is currently requiring 2 L supplemental oxygen. No history of asthma or COPD. Will obtain labs, chest x-ray, give Solu-Medrol and start nebs.  After nebs 2, patient reports she is feeling better but continues having wheezing. Labwork is overall reassuring. BNP is mildly elevated at  190, however no vascular congestion or edema noted on chest x-ray. Patient removed from supplemental oxygen with some desaturations down to 91-92% on room air. Will start on continuous neb. If remains hypoxic after, patient will need admission.  After continuous neb patient states she feels much improved.  Wheezing mostly resolved.  Work of breathing greatly improved.  When ambulated, desaturating down to 86%.  Patient will require admission for  respiratory failure and hypoxia.  Clinical picture most consistent with bronchitis.  No hx of COPD or asthma.  Discussed with IM teaching service, will admit for ongoing care.  Final Clinical Impressions(s) / ED Diagnoses   Final diagnoses:  Acute respiratory failure with hypoxia (HCC)  Wheezing  Cough    New Prescriptions New Prescriptions   No medications on file     Garlon HatchetLisa M Sanders, Cordelia Poche-C 12/27/16 1445    Benjiman CoreNathan Pickering, MD 12/27/16 [phone]

## 2016-12-27 NOTE — H&P (Signed)
Date: 12/27/2016               Patient Name:  Erin [name] MRN: [id number]  DOB: 09/16/1956 Age / Sex: 61 y.o., female   PCP: Octavio Graves, DO         Medical Service: Internal Medicine Teaching Service         Attending Physician: Dr. Axel Filler, MD    First Contact: Sula Soda, MS4 Pager: [phone]  Second Contact: Dr. Maryellen Pile Pager: [phone]       After Hours (After 5p/  First Contact Pager: [phone]  weekends / holidays): Second Contact Pager: [phone]   Chief Complaint: short of breath  History of Present Illness: Erin [name] is a 61 year old female with a past medical history significant for chronic pain, fibromyalgia, depression, diabetes type 2 with peripheral neuropathy, hypertension, chronic grade 1 diastolic heart failure, GERD and obesity presenting with acute onset shortness of breath. She reports that 2 days ago she developed a cough after being at a sporting event with many sick contacts. She has had sore throat since that time but denies any fevers, chills, nausea, vomiting, diarrhea or myalgias. This morning, she was awoken with difficulty breathing. She was unable to catch her breath and told her husband to bring her to the emergency room. Prior to this episode she has not had any shortness of breath. She denies any chest pain, leg pain or swelling, recent travel, recent surgery or history of clots. She does note that her breathing worsens with lying down flat and does report episodes of waking up at night gasping for air. Husband does report patient does snore at night and has apneic events while sleeping. She denies any wheezing but husband and friend at beside (who is a respiratory therapist) report that they have noticed her wheezing at times over the past several months. She does have a strong smoking history (43 pack years) but quit 8 years ago. No known history of asthma or COPD. She has a very poor functional status at baseline and is very  deconditioned. She reports being able to walk 20-30 yards before becoming dyspneic and this has been stable for years with no acute worsening. She infrequently climbs stairs and cannot climb a flight before becoming short of breath. Does not cook or clean and the most strenuous activity she undertakes is walking short distances. She does take lasix 20 mg every other day for chronic leg swelling.   In the ED, she was satting in the upper 80s on RA but desaturated to mid 80s with ambulation. CXR was done with no pleural effusion or consolidations. ED noted wheezing on exam on arrival. She received duoneb treatments and steroids with subjective improvement her breathing.    Meds:  Current Meds  Medication Sig  . allopurinol (ZYLOPRIM) 100 MG tablet Take 1 tablet (100 mg total) by mouth 2 (two) times daily.  . benazepril-hydrochlorthiazide (LOTENSIN HCT) 20-25 MG per tablet Take 1 tablet by mouth daily.  Marland Kitchen gabapentin (NEURONTIN) 300 MG capsule Take 2 tablets in the morning and afternoon.  Take 3 tablets at bedtime. (Patient taking differently: Take 600-900 mg by mouth 3 (three) times daily. Take 2 tablets in the morning and afternoon.  Take 3 tablets at bedtime.)  . levothyroxine (SYNTHROID, LEVOTHROID) 50 MCG tablet Take 50 mcg by mouth daily before breakfast.   . metformin (FORTAMET) 500 MG (OSM) 24 hr tablet Take 500 mg by mouth daily with breakfast.  .  omeprazole (PRILOSEC) 40 MG capsule Take 40 mg by mouth daily.  Marland Kitchen oxycodone (ROXICODONE) 30 MG immediate release tablet Take 30 mg by mouth every 4 (four) hours as needed for pain.  . SUMAtriptan (IMITREX) 100 MG tablet Take 100 mg by mouth every 2 (two) hours as needed for migraine.      Allergies: Allergies as of 12/27/2016  . (No Known Allergies)   Past Medical History:  Diagnosis Date  . Carpal tunnel syndrome   . Chronic pain   . Depression   . Diabetes mellitus   . Fibromyalgia   . GERD (gastroesophageal reflux disease)   . Hernia    . Hypertension   . Miscarriage    twins  . Peripheral neuropathy ([place])   . TMJ syndrome    Past Surgical History:  Procedure Laterality Date  . ABDOMINAL SURGERY    . TONSILLECTOMY     Family History:  Family History  Problem Relation Age of Onset  . Aneurysm Father     Deceased, 35  . CAD Father   . Heart disease Father   . Cancer Father   . Pancreatic cancer Mother     Deceased, 25   Social History:  Social History   Social History  . Marital status: Married    Spouse name: N/A  . Number of children: N/A  . Years of education: N/A   Social History Main Topics  . Smoking status: Former Research scientist (life sciences)  . Smokeless tobacco: Never Used  . Alcohol use No  . Drug use: No  . Sexual activity: Not Asked   Other Topics Concern  . None   Social History Narrative   Lives with husband in a 2 story home.  Has 3 grown daughters and 3 granddaughters.  On disability for many different reasons.    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      Review of Systems: A complete ROS was negative except as per HPI.   Physical Exam: Blood pressure (!) 107/38, pulse 78, temperature 99.3 F (37.4 C), resp. rate 18, height _0  (1.6 m), weight 250 lb (113.4 kg), SpO2 94 %. Physical Exam  Constitutional: She is oriented to person, place, and time.  Obese female sitting up in bed in no acute distress  HENT:  Head: Normocephalic and atraumatic.  Mouth/Throat: Oropharynx is clear and moist.  Eyes: Pupils are equal, round, and reactive to light.  Neck: No JVD present.  Cardiovascular: Normal rate and regular rhythm.   Murmur heard. 3/6 Systolic murmur   Pulmonary/Chest: Effort normal and breath sounds normal. No respiratory distress. She has no wheezes. She has no rales. She exhibits no tenderness.  Bibasilar crackles  Abdominal: Soft. Bowel sounds are normal. She exhibits no distension. There is no tenderness. There is no  rebound and no guarding.  Musculoskeletal: She exhibits edema.  1+ pitting edema in bilateral LE  Neurological: She is alert and oriented to person, place, and time.  Skin: Skin is warm and dry. No rash noted. No erythema.  Psychiatric: Mood and affect normal.    EKG: NSR  CXR: No acute abnormalities; no consolidations or pleural effusions present. Low lung volumes with mild basilar atelectasis without diagrammatic flattening.  Assessment & Plan by Problem:  Acute Hypoxic Respiratory Failure:  Given hypoxia and acute onset PE would be a consideration. However, modified geneva score is 3 based on heart rate indicating a low risk patient (7-9%)  and given her lack of risk factors (though relatively immobile at baseline) and presence of wheezing and improvement in symptoms with nebs, do not feel that further testing with d-dimer indicated at this time. Suspect her respiratory failure is multifactorial. Given her recent sick contacts with upper URI symptoms this could be the inciting event. Suspect there is underlying lung disease, likely COPD given her smoking history. Will check influenza panel and treat with tamiflu if positive. Will also continue Duonebs and Prednisone for possible COPD exacerbation. Additionally, she does have some underlying CHF with ECHO on 06/2015 showing grade 1 diastolic dysfunction but preserved EF 65-70%. No valvular dysfunction noted at that time but exam notable for systolic murmur today. She has 1+ pitting edema and bibasilar crackles but CXR without evidence of pleural effusion. Does endorse worsening shortness of breath while lying flat but this is acute today and has no issues at baseline. Functional status is poor at baseline but has been unchanged. Lower suspicion this is cardiac in nature but will get repeat ECHO, monitor I&Os and daily weights as well as give dose of lasix today. Will wean O2 as able to maintain sats >92%. Ambulate patient and re-check O2 sat tomorrow.   -Supplemental O2, stats >92% -Duonebs q6hr while awake -prednisone 40 mg daily x 5 days -Flu PCR, Tamiflu if positive -Lasix 40 mg IV x 1 dose -Strict I&O, daily weights -ECHO -PFTs outpatient  HTN: BP soft on admission, 109/52. ON benazepril and HCTZ at home. Holding home BP meds today, consider resumption tomorrow if BP improving.   AKI on CKD 3: Baseline appears to be 1.17-1.39. Cr elevated at 1.5 with eGFR 37 on admission. Will follow BMET.   Type 2 Diabetes: Only on metfomrin at home. Last A1c 06/2015 was 6.0. Holding metformin.  -SSI-s -CBGs qAC and qHS  Gout: On allopurinol 100 mg, reports not taking colchicine. Last uric acid 11/2016 was 6.5. Interestingly she is on HCTZ as an outpatient.  -Continue home dose allopurinol, likely needs to be on the colchicine to prevent flares as patient is not at goal uric acid  Fibromyalgia and Chronic Pain: Patient is on Oxycodone IR 30 mg q4hr prn as well as gabapentin at home. Review of [place] database shows recent prescription for Oxy IR 30 mg #210 for a month supply. This would reflect a MME of 315 daily. She has no drowsiness or somnolence today. Will give Oxy IR 30 mg q6hr prn and monitor for respiratory depression. Will discuss Narcan prior to discharge.   Hypothyroidism: Continue home dose synthroid  DVT PPx: Lovenox  Code: FULL    Dispo: Admit patient to Observation with expected length of stay less than 2 midnights.  Signed: Maryellen Pile, MD 12/27/2016, 9:48 PM  Pager: [phone]

## 2016-12-27 NOTE — H&P (Signed)
Date: 12/27/2016               Patient Name:  Erin [name] MRN: [id number]  DOB: 01-02-1956 Age / Sex: 61 y.o., female   PCP: Octavio Graves, DO              Medical Service: Internal Medicine Teaching Service              Attending Physician: Dr. Axel Filler, MD    First Contact: Sula Soda, MS 4 Pager: [phone]  Second Contact: Dr. Maryellen Pile Pager: [phone]       After Hours (After 5p/  First Contact Pager: [phone]  weekends / holidays): Second Contact Pager: [phone]   Chief Complaint: "I woke up short of breath."  History of Present Illness: Erin [name] is a 61 year old woman with a history of fibromyalgia, chronic pain, depression, diabetes, hypertension, GERD, and peripheral neuropathy who presents to the ED with shortness of breath. She states she started with a dry cough yesterday then woke up this morning around 4am feeling short of breath. She stood up and felt better, but as soon as she lied down again she felt short of breath again. She felt like she was wheezing when she came in. She has never had anything like this before. She has not noticed any increase swelling in her legs or abdomen though she does report taking Lasix every other day. She has had recently difficulty with gout flairs in her right ankle and states she has been trying to drink a lot of water because of that. She had no recent fever, chills, night sweats, nausea, vomiting, chest pain, diarrhea or constipation. She does report being around people who seemed sick at a ball game.   Generally, Erin [name] feels very deconditioned. She states she tired when walking more than 10 yards or up a few stairs.  In the ED, the patient was placed on 2L [place]. She was able to speak in short sentences. CXR was generally benign. Patient received nebs x2 and reported improvement in her breathing. She then desaturated down to 91-92% on room air and was started on continuous nebs. Wheezing mostly resolved and  work of breathing greatly improved. When ambulating, the patient desaturated down to 86%.  Meds: Current Facility-Administered Medications  Medication Dose Route Frequency Provider Last Rate Last Dose  . albuterol (PROVENTIL) (2.5 MG/3ML) 0.083% nebulizer solution            Current Outpatient Prescriptions  Medication Sig Dispense Refill  . allopurinol (ZYLOPRIM) 100 MG tablet Take 1 tablet (100 mg total) by mouth 2 (two) times daily. 60 tablet 3  . benazepril-hydrochlorthiazide (LOTENSIN HCT) 20-25 MG per tablet Take 1 tablet by mouth daily.    Marland Kitchen gabapentin (NEURONTIN) 300 MG capsule Take 2 tablets in the morning and afternoon.  Take 3 tablets at bedtime. (Patient taking differently: Take 600-900 mg by mouth 3 (three) times daily. Take 2 tablets in the morning and afternoon.  Take 3 tablets at bedtime.) 210 capsule 11  . levothyroxine (SYNTHROID, LEVOTHROID) 50 MCG tablet Take 50 mcg by mouth daily before breakfast.   10  . metformin (FORTAMET) 500 MG (OSM) 24 hr tablet Take 500 mg by mouth daily with breakfast.    . omeprazole (PRILOSEC) 40 MG capsule Take 40 mg by mouth daily.    Marland Kitchen oxycodone (ROXICODONE) 30 MG immediate release tablet Take 30 mg by mouth every 4 (four) hours as needed for pain.    Marland Kitchen  SUMAtriptan (IMITREX) 100 MG tablet Take 100 mg by mouth every 2 (two) hours as needed for migraine.   9  . colchicine 0.6 MG tablet Take 1 tablet (0.6 mg total) by mouth 2 (two) times daily. Twice daily until gout flare resolves (Patient not taking: Reported on 12/27/2016) 60 tablet 3    Allergies: Allergies as of 12/27/2016  . (No Known Allergies)   Past Medical History:  Diagnosis Date  . Carpal tunnel syndrome   . Chronic pain   . Depression   . Diabetes mellitus   . Fibromyalgia   . GERD (gastroesophageal reflux disease)   . Hernia   . Hypertension   . Miscarriage    twins  . Peripheral neuropathy ([place])   . TMJ syndrome    Past Surgical History:  Procedure Laterality Date    . ABDOMINAL SURGERY    . TONSILLECTOMY     Family History  Problem Relation Age of Onset  . Aneurysm Father     Deceased, 82  . CAD Father   . Heart disease Father   . Cancer Father   . Pancreatic cancer Mother     Deceased, 45   Social History   Social History  . Marital status: Married    Spouse name: N/A  . Number of children: N/A  . Years of education: N/A   Occupational History  . Not on file.   Social History Main Topics  . Smoking status: Former Research scientist (life sciences)  . Smokeless tobacco: Never Used  . Alcohol use No  . Drug use: No  . Sexual activity: Not on file   Other Topics Concern  . Not on file   Social History Narrative   Lives with husband in a 2 story home.  Has 3 grown daughters and 3 granddaughters.  On disability for many different reasons.    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      Review of Systems: Pertinent items noted in HPI and remainder of comprehensive ROS otherwise negative.  Physical Exam: Blood pressure 105/55, pulse 99, temperature 98.7 F (37.1 C), temperature source Oral, resp. rate 16, height '5\' 3"'  (1.6 m), weight 113.4 kg (250 lb), SpO2 91 %. BP 105/55   Pulse 99   Temp 98.7 F (37.1 C) (Oral)   Resp 16   Ht '5\' 3"'  (1.6 m)   Wt 113.4 kg (250 lb)   SpO2 91%   BMI 44.29 kg/m   General Appearance:    Alert, cooperative, no distress, appears stated age, obese body habitus with thick neck and high-set belly  Head:    Normocephalic, without obvious abnormality, atraumatic  Eyes:    PERRL, conjunctiva/corneas clear, EOM's intact  Nose:   Nares normal, septum midline, mucosa normal, no drainage    or sinus tenderness  Neck:   Supple, symmetrical, trachea midline, no adenopathy  Back:     Symmetric, no curvature, ROM normal, no CVA tenderness  Lungs:     Faint bibasilar crackles, respirations unlabored  Chest Wall:    No tenderness or deformity   Heart:    Regular rate  and rhythm, S1 and S2 normal, systolic murmur  Abdomen:     Soft, non-tender, obese abdomen, bowel sounds active all four quadrants  Extremities:   1+ peripheral edema  Pulses:   2+ and symmetric all extremities  Skin:   Skin color, texture, turgor normal, no rashes or lesions  Lymph nodes:  Cervical, supraclavicular, and axillary nodes normal  Neurologic:   CNII-XII intact, 3/5 strength in arms and legs, decrease sensation to the mid-shin to light touch and pinprick    Lab results: Basic Metabolic Panel:  Recent Labs  12/27/16 0924  NA 131*  K 4.6  CL 91*  CO2 27  GLUCOSE 126*  BUN 21*  CREATININE 1.50*  CALCIUM 8.7*   CBC:  Recent Labs  12/27/16 0924  WBC 7.4  NEUTROABS 6.0  HGB 9.0*  HCT 30.0*  MCV 91.7  PLT 315   Misc. Labs: B Natriuretic Peptide  Date Value Ref Range Status  12/27/2016 190.3 (H) 0.0 - 100.0 pg/mL Final    Imaging results:  Dg Chest 2 View  Result Date: 12/27/2016 CLINICAL DATA:  Shortness of breath. EXAM: CHEST  2 VIEW COMPARISON:  07/03/2015 . FINDINGS: Mediastinum hilar structures are normal. Low lung volumes with mild basilar atelectasis. No pleural effusion or pneumothorax. Heart size normal. Degenerative changes thoracic spine with thoracic spine scoliosis noted IMPRESSION: Low lung volumes with mild basilar atelectasis. Electronically Signed   By: Marcello Moores  Register   On: 12/27/2016 10:58    Other results: EKG: normal EKG, normal sinus rhythm, unchanged from previous tracings.  Assessment & Plan by Problem: Active Problems:   Hypoxia  Ms. Strieter is a 61 year old woman a history of diabetes, hypertension, and a previous Echo showing diastolic dysfunction who presented after sudden onset orthopnea, PND and worsening dyspnea at rest.  Acute hypoxia: Patient's story is most consistent with a CHF exacerbation including recent increase in fluid intake for gout, orthopnea, PND, dry cough, worsening dyspnea, peripheral edema, BNP 190,  prior Echo showing grade I diastolic dysfunction, and takes Lasix every other day. She is also at risk for COPD given her 40+ pack-year smoking history, though she has never been diagnosed or required treatment with inhalers. Her response to duonebs suggests she may have some underlying COPD component. Additionally, her body habitus puts her at risk for obesity hypoventilation syndrome and sleep apnea. Although she had a sudden onset of symptoms, PE is less likely given lack of a precipitating event, tachycardia, immobilization, hemoptysis or history of PE. - s/p Solu-medrol 175m once - Repeat Echo - Lasix 438m - duonebs q6H PRN for SOB or wheezing - prednisone 4031m Consult PT - Refer to pulmonology/sleep as outpatient for COPD and OSA testing  Hypertension: Patient has history of hypertension, BP currently 109/52. Takes benazepril-HCTZ (Lotensin HCT) at home.  - Continue Lotensin HCT daily  Chronic Kidney Disease Stage 3: Cre was 1.17-1.39 in 2016. Most recently 1.50 corresponding to eGFR of 37. Unclear if she has an acute kidney injury on top of chronic kidney disease, but appears to be CKD stage 3.  Type 2 Diabetes: Patient takes metformin at home. - Sliding scale insulin, sensitive, while inpatient  Gout: History of gout. On Allopurinol 100m76md colchicine at home. - Continue allopurinol  Fibromyalgia: Takes gabapentin and oxycodone q4H chronically for pain - Continue home gabapentin and oxycodone  Hypothyroidism: Takes levothyroxine at home - Continue home levothyroxine  GERD: Takes omeprazole at home - Continue omeprazole   This is a [employer] information officere.  The care of the patient was discussed with Dr. BoswCharlynn Grimes the assessment and plan was formulated with their assistance.  Please see their note for official documentation of the patient encounter.   Signed: JasoLevora Dredgedical Student 12/27/2016, 4:12 PM

## 2016-12-27 NOTE — ED Triage Notes (Signed)
Pt sts increased SOB and work of breathing x 2 days with congestion; pt noted to have labored breathing and O2 sats 90% on RA

## 2016-12-28 ENCOUNTER — Observation Stay (HOSPITAL_BASED_OUTPATIENT_CLINIC_OR_DEPARTMENT_OTHER): Payer: BLUE CROSS/BLUE SHIELD

## 2016-12-28 DIAGNOSIS — M1030 Gout due to renal impairment, unspecified site: Secondary | ICD-10-CM | POA: Diagnosis not present

## 2016-12-28 DIAGNOSIS — N179 Acute kidney failure, unspecified: Secondary | ICD-10-CM | POA: Diagnosis present

## 2016-12-28 DIAGNOSIS — Z7984 Long term (current) use of oral hypoglycemic drugs: Secondary | ICD-10-CM

## 2016-12-28 DIAGNOSIS — Z79899 Other long term (current) drug therapy: Secondary | ICD-10-CM | POA: Diagnosis not present

## 2016-12-28 DIAGNOSIS — M797 Fibromyalgia: Secondary | ICD-10-CM

## 2016-12-28 DIAGNOSIS — E1122 Type 2 diabetes mellitus with diabetic chronic kidney disease: Secondary | ICD-10-CM | POA: Diagnosis not present

## 2016-12-28 DIAGNOSIS — J9601 Acute respiratory failure with hypoxia: Secondary | ICD-10-CM | POA: Diagnosis not present

## 2016-12-28 DIAGNOSIS — J101 Influenza due to other identified influenza virus with other respiratory manifestations: Secondary | ICD-10-CM

## 2016-12-28 DIAGNOSIS — Z79891 Long term (current) use of opiate analgesic: Secondary | ICD-10-CM | POA: Diagnosis not present

## 2016-12-28 DIAGNOSIS — N183 Chronic kidney disease, stage 3 unspecified: Secondary | ICD-10-CM

## 2016-12-28 DIAGNOSIS — B9789 Other viral agents as the cause of diseases classified elsewhere: Secondary | ICD-10-CM

## 2016-12-28 DIAGNOSIS — R0600 Dyspnea, unspecified: Secondary | ICD-10-CM

## 2016-12-28 DIAGNOSIS — J111 Influenza due to unidentified influenza virus with other respiratory manifestations: Secondary | ICD-10-CM | POA: Diagnosis present

## 2016-12-28 DIAGNOSIS — I129 Hypertensive chronic kidney disease with stage 1 through stage 4 chronic kidney disease, or unspecified chronic kidney disease: Secondary | ICD-10-CM

## 2016-12-28 LAB — BASIC METABOLIC PANEL
ANION GAP: 12 (ref 5–15)
BUN: 23 mg/dL — ABNORMAL HIGH (ref 6–20)
CO2: 29 mmol/L (ref 22–32)
Calcium: 9.3 mg/dL (ref 8.9–10.3)
Chloride: 91 mmol/L — ABNORMAL LOW (ref 101–111)
Creatinine, Ser: 1.13 mg/dL — ABNORMAL HIGH (ref 0.44–1.00)
GFR calc Af Amer: >60 mL/min (ref >60)
GFR calc non Af Amer: 53 mL/min — ABNORMAL LOW (ref >60)
GLUCOSE: 124 mg/dL — AB (ref 65–99)
POTASSIUM: 4.7 mmol/L (ref 3.5–5.1)
Sodium: 132 mmol/L — ABNORMAL LOW (ref 135–145)

## 2016-12-28 LAB — GLUCOSE, CAPILLARY
Glucose-Capillary: 120 mg/dL — ABNORMAL HIGH (ref 65–99)
Glucose-Capillary: 145 mg/dL — ABNORMAL HIGH (ref 65–99)

## 2016-12-28 LAB — ECHOCARDIOGRAM COMPLETE
Height: 63 in
WEIGHTICAEL: 4000 [oz_av]

## 2016-12-28 MED ORDER — HYDROCHLOROTHIAZIDE 25 MG PO TABS
25.0000 mg | ORAL_TABLET | Freq: Every day | ORAL | Status: DC
  Administered 2016-12-28: 25 mg via ORAL
  Filled 2016-12-28: qty 1

## 2016-12-28 MED ORDER — BENAZEPRIL HCL 40 MG PO TABS
20.0000 mg | ORAL_TABLET | Freq: Every day | ORAL | Status: DC
  Administered 2016-12-28: 20 mg via ORAL
  Filled 2016-12-28: qty 1

## 2016-12-28 MED ORDER — GABAPENTIN 300 MG PO CAPS
600.0000 mg | ORAL_CAPSULE | Freq: Two times a day (BID) | ORAL | Status: DC
  Administered 2016-12-28 (×2): 600 mg via ORAL
  Filled 2016-12-28 (×2): qty 2

## 2016-12-28 MED ORDER — GABAPENTIN 300 MG PO CAPS: 900.0000 mg | ORAL_CAPSULE | Freq: Every day | ORAL | Status: DC

## 2016-12-28 MED ORDER — PREDNISONE 20 MG PO TABS: 40.0000 mg | ORAL_TABLET | Freq: Every day | ORAL | 0 refills | Status: DC

## 2016-12-28 MED ORDER — OSELTAMIVIR PHOSPHATE 75 MG PO CAPS
75.0000 mg | ORAL_CAPSULE | Freq: Two times a day (BID) | ORAL | Status: DC
  Administered 2016-12-28: 75 mg via ORAL
  Filled 2016-12-28 (×3): qty 1

## 2016-12-28 MED ORDER — OSELTAMIVIR PHOSPHATE 75 MG PO CAPS: 75.0000 mg | ORAL_CAPSULE | Freq: Two times a day (BID) | ORAL | 0 refills | Status: DC

## 2016-12-28 MED ORDER — BENAZEPRIL-HYDROCHLOROTHIAZIDE 20-25 MG PO TABS: 1.0000 | ORAL_TABLET | Freq: Every day | ORAL | Status: DC

## 2016-12-28 MED ORDER — ALBUTEROL SULFATE 108 (90 BASE) MCG/ACT IN AEPB: 2.0000 | INHALATION_SPRAY | Freq: Four times a day (QID) | RESPIRATORY_TRACT | 0 refills | Status: DC | PRN

## 2016-12-28 NOTE — Care Management Obs Status (Signed)
MEDICARE OBSERVATION STATUS NOTIFICATION   Patient Details  Name: Erin [name] MRN: [id number] Date of Birth: 07-17-1956   Medicare Observation Status Notification Given:  Yes    Lawerance Sabalebbie Jodette Wik, RN 12/28/2016, 11:00 AM

## 2016-12-28 NOTE — Evaluation (Signed)
Physical Therapy Evaluation Patient Details Name: Erin MinionCarol F [name] MRN: [id number] DOB: [birth year] Today's Date: 12/28/2016   History of Present Illness  61 year old woman presenting to the emergency department because of progressive shortness of breath. Pt diagnosed with acute hypoxic respiratory failure due to influenza. PMH: neuropathy, HTN, fibromyalgia, diabetes, chronic pain.   Clinical Impression  Pt admitted as noted above. Currently the pt is ambulating 125 ft with good stability, lightly using dynamap cart for additional support but ambulating in room without device. Pt and spouse report feeling confident with returning home following release from the hospital.   Resting HR: 64 Initial O2 sat: 93%  Ambulation: HR: 90-100 O2 sat: 91-97%  Post HR: 93 Post O2 sat: 96%      Follow Up Recommendations No PT follow up;Supervision - Intermittent    Equipment Recommendations  None recommended by PT    Recommendations for Other Services       Precautions / Restrictions Precautions Precautions: None Restrictions Weight Bearing Restrictions: No      Mobility  Bed Mobility Overal bed mobility: Independent             General bed mobility comments: supine to sitting  Transfers Overall transfer level: Independent Equipment used: None             General transfer comment: stable transfers with safe technique  Ambulation/Gait Ambulation/Gait assistance: Modified independent (Device/Increase time) Ambulation Distance (Feet): 125 Feet Assistive device:  (holding dynamap cart ) Gait Pattern/deviations: Step-through pattern Gait velocity: mild decrease   General Gait Details: good stability, no loss of balance.   Stairs            Wheelchair Mobility    Modified Rankin (Stroke Patients Only)       Balance Overall balance assessment: Needs assistance Sitting-balance support: No upper extremity supported Sitting balance-Leahy Scale: Good      Standing balance support: During functional activity Standing balance-Leahy Scale: Fair                               Pertinent Vitals/Pain Pain Assessment: No/denies pain    Home Living Family/patient expects to be discharged to:: Private residence Living Arrangements: Spouse/significant other Available Help at Discharge: Family;Available PRN/intermittently Type of Home: House Home Access: Level entry     Home Layout: One level Home Equipment: Walker - 2 wheels;Cane - single point;Crutches Additional Comments: spouse works, home in the evening.     Prior Function Level of Independence: Independent               Hand Dominance        Extremity/Trunk Assessment   Upper Extremity Assessment Upper Extremity Assessment: Overall WFL for tasks assessed    Lower Extremity Assessment Lower Extremity Assessment: Overall WFL for tasks assessed       Communication   Communication: No difficulties  Cognition Arousal/Alertness: Awake/alert Behavior During Therapy: WFL for tasks assessed/performed Overall Cognitive Status: Within Functional Limits for tasks assessed                      General Comments General comments (skin integrity, edema, etc.): Pt ambulating on RA, SpO2 in 90s throughout.     Exercises     Assessment/Plan    PT Assessment Patient needs continued PT services  PT Problem List Decreased activity tolerance          PT Treatment Interventions DME instruction;Gait training;Stair training;Therapeutic exercise;Patient/family  education    PT Goals (Current goals can be found in the Care Plan section)  Acute Rehab PT Goals Patient Stated Goal: get home PT Goal Formulation: With patient Time For Goal Achievement: 01/04/17 Potential to Achieve Goals: Good    Frequency Min 3X/week   Barriers to discharge        Co-evaluation               End of Session   Activity Tolerance: Patient limited by fatigue Patient  left: in bed;with call bell/phone within reach;with family/visitor present Nurse Communication: Mobility status         Time: 1100-1120 PT Time Calculation (min) (ACUTE ONLY): 20 min   Charges:   PT Evaluation $PT Eval Moderate Complexity: 1 Procedure     PT G Codes:        Christiane Ha, PT, CSCS Pager [phone] Office [phone]  12/28/2016, 11:33 AM

## 2016-12-28 NOTE — Progress Notes (Signed)
Subjective: Erin [name] is feeling better this morning. She was weaned to 1L [place] overnight and tolerated coming off the supplemental O2 while in the room. Talking in full sentences without difficulty. She has not been up and out of bed since admission. Still complaining of non-productive cough but otherwise no complaints. No fevers or chills overnight. No nausea or vomiting.   Objective: Vital signs in last 24 hours: Vitals:   12/28/16 0105 12/28/16 0442 12/28/16 0947 12/28/16 0954  BP:  118/62 (!) 146/67   Pulse:  64 64 67  Resp:  20 20 20   Temp:  98.3 F (36.8 C) 98.3 F (36.8 C)   TempSrc:   Oral   SpO2: 95% 94% 95% 93%  Weight:      Height:       Weight change:   Intake/Output Summary (Last 24 hours) at 12/28/16 1034 Last data filed at 12/28/16 0600  Gross per 24 hour  Intake                0 ml  Output                0 ml  Net                0 ml    Physical Exam  Constitutional: She is well-developed, well-nourished, and in no distress. No distress.  HENT:  Head: Normocephalic and atraumatic.  Cardiovascular: Normal rate and regular rhythm.   Murmur heard. Pulmonary/Chest: Effort normal and breath sounds normal. No respiratory distress. She has no wheezes. She has no rales.  Musculoskeletal: She exhibits no edema.  Skin: Skin is warm and dry.  Psychiatric: Mood and affect normal.   Medications: I have reviewed the patient's current medications. Scheduled Meds: . allopurinol  100 mg Oral BID  . enoxaparin (LOVENOX) injection  40 mg Subcutaneous Q24H  . gabapentin  600 mg Oral BID  . gabapentin  900 mg Oral QHS  . insulin aspart  0-5 Units Subcutaneous QHS  . insulin aspart  0-9 Units Subcutaneous TID WC  . ipratropium-albuterol  3 mL Nebulization Q6H  . levothyroxine  50 mcg Oral QAC breakfast  . mouth rinse  15 mL Mouth Rinse BID  . oseltamivir  75 mg Oral BID  . pantoprazole  40 mg Oral Daily  . predniSONE  40 mg Oral Q breakfast  . senna  1 tablet Oral  BID  . sodium chloride flush  3 mL Intravenous Q12H   Continuous Infusions: PRN Meds:.sodium chloride, acetaminophen **OR** acetaminophen, albuterol, ipratropium-albuterol, oxycodone, sodium chloride flush Assessment/Plan:  Acute hypoxic respiratory failure secondary to Influenza A with possible underlying COPD exacerbation: Influenza PCR resulted out positive for Influenza A. Started on renally dosed Tamfilu last night but renal function improved this morning and adjusted to full dose today. Suspect there is underlying lung disease, possibly COPD, given her smoking history and wheezing on presentation. No further wheezing today with nebulizer treatments. Will treat with 5 day course of prednisone and will provide albuterol inhaler at discharge. She was unable to maintain O2 saturations with ambulation yesterday, will re-check today. Given her state of deconditioning, it is highly likely that she will require home supplemental oxygen at discharge and likely home health physical therapy. Will need follow up with PCP for PFTs.  -Tamiflu 75 mg daily day 2/5 -Prednisone 40 mg 40 mg day 2/5 -Duonebs q6hr while awake, will provide albuterol inhaler at discharge -Check ambulatory O2 sats -PT/OT consult  -Resume home  dose lasix  HTN: BP 146/67 this morning. Will restart home dose benazepril and HCTZ today.   AKI on CKD 3: AKI resolved, Cr back to baseline. Baseline appears to be 1.17-1.39. Cr 1.5 > 1.13 today with GFR improved 37 > 53.   Type 2 Diabetes: Only on metfomrin at home. Last A1c 06/2015 was 6.0. Holding metformin.  CBGs 120s this morning; improved form 200s yesterday. She is on short course of prednisone. Will follow up with PCP after discharge.  -SSI-s -CBGs qAC and qHS  Gout: On allopurinol 100 mg, reports not taking colchicine. Last uric acid 11/2016 was 6.5. Interestingly she is on HCTZ as an outpatient.  -Continue home dose allopurinol, likely needs to be on the colchicine to  prevent flares as patient is not at goal uric acid  Fibromyalgia and Chronic Pain: Patient is on Oxycodone IR 30 mg q4hr prn as well as gabapentin at home. Review of [place] database shows recent prescription for Oxy IR 30 mg #210 for a month supply. This would reflect a MME of 315 daily. She has no drowsiness or somnolence today. Will give Oxy IR 30 mg q6hr prn and monitor for respiratory depression.   Hypothyroidism: Continue home dose synthroid  DVT PPx: Lovenox  Dispo: Anticipate discharge later today   The patient does have a current PCP Samuel Jester, DO) and does need an [facility] hospital follow-up appointment after discharge.  The patient does not have transportation limitations that hinder transportation to clinic appointments.  .Services Needed at time of discharge: Y = Yes, Blank = No PT:   OT:   RN:   Equipment:   Other:     LOS: 0 days   Valentino Nose, MD IMTS PGY-2 [phone] 12/28/2016, 10:34 AM

## 2016-12-28 NOTE — Progress Notes (Signed)
Subjective: [name] is doing well today. She feels that the breathing treatments have been helping. She has a persistent bothersome dry cough.  Objective: Vital signs in last 24 hours: Vitals:   12/27/16 2016 12/27/16 2128 12/28/16 0105 12/28/16 0442  BP:  (!) 107/38  118/62  Pulse:  78  64  Resp:  18  20  Temp:  99.3 F (37.4 C)  98.3 F (36.8 C)  TempSrc:      SpO2: 96% 94% 95% 94%  Weight:      Height:       Weight change:   Intake/Output Summary (Last 24 hours) at 12/28/16 0919 Last data filed at 12/28/16 0600  Gross per 24 hour  Intake                0 ml  Output                0 ml  Net                0 ml   BP 118/62   Pulse 64   Temp 98.3 F (36.8 C)   Resp 20   Ht 5\' 3"  (1.6 m)   Wt 113.4 kg (250 lb)   SpO2 94%   BMI 44.29 kg/m   General Appearance:    Alert, cooperative, no distress  Lungs:     Clear to auscultation bilaterally, respirations unlabored   Heart:    Regular rate and rhythm, S1 and S2 normal, no murmur, rub   or gallop  Abdomen:     Soft, non-tender, bowel sounds active all four quadrants,    no masses, no organomegaly  Extremities:   Extremities normal, atraumatic, trace edema   Lab Results: Basic Metabolic Panel:  Recent Labs Lab 12/27/16 0924 12/28/16 0608  NA 131* 132*  K 4.6 4.7  CL 91* 91*  CO2 27 29  GLUCOSE 126* 124*  BUN 21* 23*  CREATININE 1.50* 1.13*  CALCIUM 8.7* 9.3   CBC:  Recent Labs Lab 12/27/16 0924  WBC 7.4  NEUTROABS 6.0  HGB 9.0*  HCT 30.0*  MCV 91.7  PLT 315   CBG:  Recent Labs Lab 12/27/16 1749 12/27/16 2124 12/28/16 0750  GLUCAP 252* 205* 120*    Medications: I have reviewed the patient's current medications. Scheduled Meds: . allopurinol  100 mg Oral BID  . enoxaparin (LOVENOX) injection  40 mg Subcutaneous Q24H  . furosemide  40 mg Oral Daily  . gabapentin  600 mg Oral BID  . gabapentin  900 mg Oral QHS  . Influenza vac split quadrivalent PF  0.5 mL Intramuscular  Tomorrow-1000  . insulin aspart  0-5 Units Subcutaneous QHS  . insulin aspart  0-9 Units Subcutaneous TID WC  . ipratropium-albuterol  3 mL Nebulization Q6H  . levothyroxine  50 mcg Oral QAC breakfast  . mouth rinse  15 mL Mouth Rinse BID  . oseltamivir  30 mg Oral BID  . pantoprazole  40 mg Oral Daily  . pneumococcal 23 valent vaccine  0.5 mL Intramuscular Tomorrow-1000  . predniSONE  40 mg Oral Q breakfast  . senna  1 tablet Oral BID  . sodium chloride flush  3 mL Intravenous Q12H   Continuous Infusions: PRN Meds:.sodium chloride, acetaminophen **OR** acetaminophen, albuterol, ipratropium-albuterol, oxycodone, sodium chloride flush Assessment/Plan: Principal Problem:   Influenza with respiratory manifestation Active Problems:   Essential hypertension   Idiopathic chronic gout of right ankle without tophus   Hypoxia  Acute  hypoxic respiratory failure secondary to Influenza A: The patient tested positive for Influenza A and was started on Tamiflu. Her presentation was suggestive of a COPD exacerbation and she has improved with duonebs and steroid. She has not been formally evaluated for COPD, but her 40+ pack-year smoking history makes this likely. This morning her oxygen was in the mid 90s on room air. - Duonebs q6H while awake - prednisone 40mg  daily x 5 days - Tamiflu x 5 days - PT and OT Consults, determine home O2 and rehab needs - Return Lasix dose to home dose  - Recommend PFTs as outpatient  Hypertension: Takes benazepril and HCTZ at home. Holding for soft pressures.  AKI on CKD3: Creatinine improved from 1.5 to 1.13 this AM. This appears to be her baseline. Resolved AKI.  Type 2 Diabetes: Only on metformin at home. Glucose managed well on sliding scale as inpatient. - Sliding scale insulin, sensitive - CBGs qAC and aHS  Gout: On allopurinol at home, reports not taking colchicine. Last uric acid was 6.5 on 11/2016. Interestingly, she is on HCTZ as an outpatient which may  exacerbate her gout. - Continue home dose allopurinol  Fibromyalgia and Chronic Pain: Patient has significant chronic opiate use to manage her pain. Spoke with her today about the risk of respiratory depression on these medications. Will discuss Narcan prior to discharge. - Continue home gabapentin - Continue home oxycodone  Hypothyroidism: Continue home dose synthroid.  DVT Ppx: Lovenox  Code: Full  This is a Psychologist, occupational Note.  The care of the patient was discussed with Dr. Karma Greaser and the assessment and plan formulated with their assistance.  Please see their attached note for official documentation of the daily encounter.   LOS: 0 days   Elmon Kirschner, Medical Student 12/28/2016, 9:19 AM

## 2016-12-28 NOTE — Discharge Summary (Signed)
Name: Erin [name] MRN: [id number] DOB: 01/06/1956 61 y.o. PCP: Erin Jester, DO  Date of Admission: 12/27/2016  9:10 AM Date of Discharge: 12/28/2016 Attending Physician: Tyson Alias, MD  Discharge Diagnosis: 1. Acute hypoxic respiratory failure secondary to Influenza A Principal Problem:   Influenza with respiratory manifestation Active Problems:   Essential hypertension   Idiopathic chronic gout of right ankle without tophus   Hypoxia   Acute kidney injury (HCC)   CKD (chronic kidney disease) stage 3, GFR 30-59 ml/min   Discharge Medications: Allergies as of 12/28/2016   No Known Allergies     Medication List    TAKE these medications   Albuterol Sulfate 108 (90 Base) MCG/ACT Aepb Inhale 2 puffs into the lungs every 6 (six) hours as needed (for wheezing).   allopurinol 100 MG tablet Commonly known as:  ZYLOPRIM Take 1 tablet (100 mg total) by mouth 2 (two) times daily.   benazepril-hydrochlorthiazide 20-25 MG tablet Commonly known as:  LOTENSIN HCT Take 1 tablet by mouth daily.   colchicine 0.6 MG tablet Take 1 tablet (0.6 mg total) by mouth 2 (two) times daily. Twice daily until gout flare resolves   gabapentin 300 MG capsule Commonly known as:  NEURONTIN Take 2 tablets in the morning and afternoon.  Take 3 tablets at bedtime. What changed:  how much to take  how to take this  when to take this  additional instructions   levothyroxine 50 MCG tablet Commonly known as:  SYNTHROID, LEVOTHROID Take 50 mcg by mouth daily before breakfast.   metformin 500 MG (OSM) 24 hr tablet Commonly known as:  FORTAMET Take 500 mg by mouth daily with breakfast.   omeprazole 40 MG capsule Commonly known as:  PRILOSEC Take 40 mg by mouth daily.   oseltamivir 75 MG capsule Commonly known as:  TAMIFLU Take 1 capsule (75 mg total) by mouth 2 (two) times daily.   oxycodone 30 MG immediate release tablet Commonly known as:  ROXICODONE Take 30 mg by  mouth every 4 (four) hours as needed for pain.   predniSONE 20 MG tablet Commonly known as:  DELTASONE Take 2 tablets (40 mg total) by mouth daily with breakfast.   SUMAtriptan 100 MG tablet Commonly known as:  IMITREX Take 100 mg by mouth every 2 (two) hours as needed for migraine.       Disposition and follow-up:   [name] was discharged from [facility] in Stable condition.  At the hospital follow up visit please address:  1.  Recommend PFTs to eval for COPD, consider switching off HCTZ as this can worsen gout, consider alternative pain regimen given risk for respiratory depression.  2.  Labs / imaging needed at time of follow-up: PFTs  3.  Pending labs/ test needing follow-up: None  Follow-up Appointments: Schedule a follow up appointment with your primary care provider for a hospital follow up in 1 week.  Hospital Course by problem list:  1. Acute hypoxic respiratory failure secondary to Influenza A Patient presented with worsening dyspnea, orthopnea, and PND. She was initially hypoxic requiring 2L [place] and had wheezing on exam which improved with nebulizer and steroid therapy. She tested Influenza A positive and was started on Tamiflu. She was discharged on Tamiflu and prednisone for a 5 day course, and given an albuterol inhaler to use when wheezing. Suspect some component of underlying lung disease, likely COPD, and would recommend PFT testing as an outpatient. She was able to maintain  O2 sats >90% with ambulation prior to discharge.   2. Hypertension Patient's BP was low during admission. Instructed patient to continue home benazepril and HCTZ on discharge.  3. Acute kidney injury on CKD3 On admission, creatinine was elevated to 1.5 from baseline of 1.17. She improved to baseline by discharge.  4. Type 2 diabetes HgbA1c was 6.1. Patient was maintained on insulin as an inpatient. Instructed to continue metformin as an outpatient  5.  Gout Patient was continued on allopurinol. Last uric acid on 11/2016 was 6.5. Colchicine may help her achieve goal uric acid and prevent future flares. She also takes HCTZ as an outpatient, which may worsen gout. Consider switching to an alternative anti-hypertensive regimen.  6. Fibromyalgia and chronic pain Patient is on oxycodone and gabapentin at home. Review of [facility] database shows recent prescription of #210 for a month supply. This would reflect a MME of 315 daily. She had no drowsiness or somnolence but is at risk for respiratory depression. Consider an alternative pain regimen to reduce risk of respiratory complications as well as Narcan prescription for family to use in an emergency (lives with husband).   Discharge Vitals:   BP (!) 146/67 (BP Location: Left Arm)   Pulse 67   Temp 98.3 F (36.8 C) (Oral)   Resp 20   Ht 5\' 3"  (1.6 m)   Wt 250 lb (113.4 kg)   SpO2 93%   BMI 44.29 kg/m   Pertinent Labs, Studies, and Procedures:  Influenza A Positive  CHEST 2 VIEW  FINDINGS: Mediastinum hilar structures are normal. Low lung volumes with mild basilar atelectasis. No pleural effusion or pneumothorax. Heart size normal. Degenerative changes thoracic spine with thoracic spine scoliosis noted IMPRESSION: Low lung volumes with mild basilar atelectasis.  2D ECHO Study Conclusions  - Left ventricle: The cavity size was normal. There was moderate   concentric hypertrophy. Systolic function was normal. The   estimated ejection fraction was in the range of 55% to 60%. Wall   motion was normal; there were no regional wall motion   abnormalities. Left ventricular diastolic function parameters   were normal. - Left atrium: The atrium was mildly dilated. Anterior-posterior   dimension: 44 mm. - Atrial septum: There was increased thickness of the septum,   consistent with lipomatous hypertrophy. - Pulmonary arteries: PA peak pressure: 37 mm Hg (S).  Impressions:  - The right  ventricular systolic pressure was increased consistent   with mild pulmonary hypertension.   Creatinine, Ser  Date Value Ref Range Status  12/28/2016 1.13 (H) 0.44 - 1.00 mg/dL Final  81/19/1478 2.95 (H) 0.44 - 1.00 mg/dL Final  62/13/0865 7.84 0.40 - 1.20 mg/dL Final    Discharge Instructions: Discharge Instructions    Discharge instructions    Complete by:  As directed    Finish your total 5 days course of prednisone and Tamiflu.  Follow up with your Primary care doctor.  Use albuterol inhaler as needed for wheezing and shortness of breathe.    Ms. Jardin, you were admitted to the hospital because you were short of breath and your oxygen level was low. You were diagnosed with Influenza A and started on a medication called Tamiflu. You also were having wheezing, which suggests you might also have COPD. We treated you with inhaled medications called albuterol and ipratropium and gave you a short course of steroids to help your wheezing. When you go home, you will continue taking Tamiflu and prednisone to finish a 5 day course. We  will also give you an inhaler to use if you feel that you are wheezing. We highly recommend talking to your primary doctor about being tested for COPD with a pulmonary function test. Additionally, as we discussed, your pain medication regimen is especially risky because of your shortness of breath. Oxycodone can cause your breathing to slow to a dangerous level. We highly recommend talking to your primary doctor about trying to find alternative medications or strategies that work to control your pain without that risk.   Signed: Valentino NoseNathan Rawleigh Rode, MD 12/29/2016, 7:23 AM

## 2016-12-28 NOTE — Progress Notes (Signed)
  Echocardiogram 2D Echocardiogram has been performed.  Arvil ChacoFoster, Whitlee Sluder 12/28/2016, 12:59 PM

## 2016-12-28 NOTE — Discharge Instructions (Signed)
[name], you were admitted to the hospital because you were short of breath and your oxygen level was low. You were diagnosed with Influenza A and started on a medication called Tamiflu. You also were having wheezing, which suggests you might also have COPD. We treated you with inhaled medications called albuterol and ipratropium and gave you a short course of steroids to help your wheezing. When you go home, you will continue taking Tamiflu and prednisone to finish a 5 day course. We will also give you an inhaler to use if you feel that you are wheezing. We highly recommend talking to your primary doctor about being tested for COPD with a pulmonary function test. Additionally, as we discussed, your pain medication regimen is especially risky because of your shortness of breath. Oxycodone can cause your breathing to slow to a dangerous level. We highly recommend talking to your primary doctor about trying to find alternative medications or strategies that work to control your pain without that risk.   Influenza, Adult Influenza (the flu") is an infection in the lungs, nose, and throat (respiratory tract). It is caused by a virus. The flu causes many common cold symptoms, as well as a high fever and body aches. It can make you feel very sick. The flu spreads easily from person to person (is contagious). Getting a flu shot (influenza vaccination) every year is the best way to prevent the flu. Follow these instructions at home:  Take over-the-counter and prescription medicines only as told by your doctor.  Use a cool mist humidifier to add moisture (humidity) to the air in your home. This can make it easier to breathe.  Rest as needed.  Drink enough fluid to keep your pee (urine) clear or pale yellow.  Cover your mouth and nose when you cough or sneeze.  Wash your hands with soap and water often, especially after you cough or sneeze. If you cannot use soap and water, use hand sanitizer.  Stay  home from work or school as told by your doctor. Unless you are visiting your doctor, try to avoid leaving home until your fever has been gone for 24 hours without the use of medicine.  Keep all follow-up visits as told by your doctor. This is important. How is this prevented?  Getting a yearly (annual) flu shot is the best way to avoid getting the flu. You may get the flu shot in late summer, fall, or winter. Ask your doctor when you should get your flu shot.  Wash your hands often or use hand sanitizer often.  Avoid contact with people who are sick during cold and flu season.  Eat healthy foods.  Drink plenty of fluids.  Get enough sleep.  Exercise regularly. Contact a doctor if:  You get new symptoms.  You have:  Chest pain.  Watery poop (diarrhea).  A fever.  Your cough gets worse.  You start to have more mucus.  You feel sick to your stomach (nauseous).  You throw up (vomit). Get help right away if:  You start to be short of breath or have trouble breathing.  Your skin or nails turn a bluish color.  You have very bad pain or stiffness in your neck.  You get a sudden headache.  You get sudden pain in your face or ear.  You cannot stop throwing up. This information is not intended to replace advice given to you by your health care provider. Make sure you discuss any questions you have with  your health care provider. Document Released: 08/10/2008 Document Revised: 04/08/2016 Document Reviewed: 08/26/2015 Elsevier Interactive Patient Education  2017 [employer].

## 2016-12-28 NOTE — Progress Notes (Signed)
PHARMACY NOTE:  ANTIMICROBIAL RENAL DOSAGE ADJUSTMENT  Current antimicrobial regimen includes a mismatch between antimicrobial dosage and estimated renal function.  As per policy approved by the Pharmacy & Therapeutics and Medical Executive Committees, the antimicrobial dosage will be adjusted accordingly.  Current antimicrobial dosage:  Tamiflu 30 mg BID  Indication:  Positive Influenza A  Renal Function:  Creatinine improved 1.50>1.13, crcl > 60 ml/min  Estimated Creatinine Clearance: 65.8 mL/min (by C-G formula based on SCr of 1.13 mg/dL (H)). []      On intermittent HD, scheduled: []      On CRRT    Antimicrobial dosage has been changed to:  Tamilfu 75 mg BID  Additional comments:  Had 1 dose of 30 mg on 2/12 pm.   Thank you for allowing pharmacy to be a part of this patient's care.  Dennie Fettersgan, Yosiel Thieme Donovan, [facility]  Pager: [phone] 12/28/2016 9:26 AM

## 2016-12-28 NOTE — Progress Notes (Signed)
Occupational Therapy Evaluation Patient Details Name: Newt MinionCarol F Minch MRN: [id number] DOB: [birth year] Today's Date: 12/28/2016    History of Present Illness 61 year old woman presenting to the emergency department because of progressive shortness of breath. Pt diagnosed with acute hypoxic respiratory failure due to influenza. PMH: neuropathy, HTN, fibromyalgia, diabetes, chronic pain.    Clinical Impression   PTA, pt was Mod independent in ADLs and functional mobility with decreased activity tolerance due to above diagnoses. Pt demonstrated near baseline function. Provided pt with handout on energy conservation and provided verbal education; pt and spouse verbalized understanding. Recommend d/c to home once medically stable per physician. D/c acute OT.     Follow Up Recommendations  No OT follow up    Equipment Recommendations       Recommendations for Other Services       Precautions / Restrictions Precautions Precautions: None Restrictions Weight Bearing Restrictions: No      Mobility Bed Mobility Overal bed mobility: Independent             General bed mobility comments: supine to sitting  Transfers Overall transfer level: Independent Equipment used: None             General transfer comment: stable transfers with safe technique    Balance Overall balance assessment: Needs assistance Sitting-balance support: No upper extremity supported Sitting balance-Leahy Scale: Good     Standing balance support: During functional activity Standing balance-Leahy Scale: Good                              ADL Overall ADL's : At baseline                                       General ADL Comments: Pt near baseline for ADLs and mobility     Vision     Perception     Praxis      Pertinent Vitals/Pain Pain Assessment: No/denies pain     Hand Dominance     Extremity/Trunk Assessment Upper Extremity Assessment Upper  Extremity Assessment: Overall WFL for tasks assessed   Lower Extremity Assessment Lower Extremity Assessment: Overall WFL for tasks assessed       Communication Communication Communication: No difficulties   Cognition Arousal/Alertness: Awake/alert Behavior During Therapy: WFL for tasks assessed/performed Overall Cognitive Status: Within Functional Limits for tasks assessed                     General Comments       Exercises       Shoulder Instructions      Home Living Family/patient expects to be discharged to:: Private residence Living Arrangements: Spouse/significant other Available Help at Discharge: Family;Available PRN/intermittently Type of Home: House Home Access: Level entry     Home Layout: Able to live on main level with bedroom/bathroom;Two level               Home Equipment: Walker - 2 wheels;Cane - single point;Crutches   Additional Comments: spouse works, home in the evening.       Prior Functioning/Environment Level of Independence: Independent                 OT Problem List:     OT Treatment/Interventions:      OT Goals(Current goals can be found in the care plan section) Acute Rehab  OT Goals Patient Stated Goal: get home OT Goal Formulation: With patient  OT Frequency:     Barriers to D/C:            Co-evaluation              End of Session Nurse Communication: Mobility status  Activity Tolerance: Patient tolerated treatment well Patient left: in bed;with call bell/phone within reach;with family/visitor present   Time: 1134-1145 OT Time Calculation (min): 11 min Charges:  OT General Charges $OT Visit: 1 Procedure OT Evaluation $OT Eval Low Complexity: 1 Procedure G-Codes: OT G-codes **NOT FOR INPATIENT CLASS** Functional Assessment Tool Used: Clinical Judgment Functional Limitation: Self care Self Care Current Status (R6045): At least 1 percent but less than 20 percent impaired, limited or  restricted Self Care Goal Status (W0981): At least 1 percent but less than 20 percent impaired, limited or restricted Self Care Discharge Status [phone]): At least 1 percent but less than 20 percent impaired, limited or restricted  Gabriel Rung 12/28/2016, 11:54 AM  Curlene Dolphin, OTR/L [phone]

## 2016-12-29 LAB — HEMOGLOBIN A1C
Hgb A1c MFr Bld: 6.1 % — ABNORMAL HIGH (ref 4.8–5.6)
Mean Plasma Glucose: 128 mg/dL

## 2017-01-12 ENCOUNTER — Encounter (INDEPENDENT_AMBULATORY_CARE_PROVIDER_SITE_OTHER): Payer: Self-pay | Admitting: Orthopedic Surgery

## 2017-01-12 ENCOUNTER — Ambulatory Visit (INDEPENDENT_AMBULATORY_CARE_PROVIDER_SITE_OTHER): Payer: BLUE CROSS/BLUE SHIELD | Admitting: Orthopedic Surgery

## 2017-01-12 DIAGNOSIS — M1A09X0 Idiopathic chronic gout, multiple sites, without tophus (tophi): Secondary | ICD-10-CM | POA: Diagnosis not present

## 2017-01-12 NOTE — Progress Notes (Signed)
Office Visit Note   Patient: Erin [name]           Date of Birth: January 05, 1956           MRN: [id number] Visit Date: 01/12/2017              Requested by: Erin Jester, DO [address] [place], [place] [address] PCP: Erin Jester, DO  Chief Complaint  Patient presents with  . Right Ankle - Follow-up    HPI: Patient is a 61 y.o female who presents today for follow up right ankle. She is status post right ankle injection 12/14/16. She is doing much better and noticed a decrease in her swelling. She is pleased with her progress. Prior uric acid was 6.5 and continues with allopurinol. Erin [name], RT  Patient states that she had remarkable improvement of her ankle symptoms after the injection. This is consistent with an acute gout  Assessment & Plan: Visit Diagnoses:  1. Idiopathic chronic gout of multiple sites without tophus     Plan: We'll have her continue the allopurinol 100 mg twice a day. Follow-up in 4 weeks with repeat uric acid level at follow-up. Discussed that we will adjust her allopurinol pending those results. Discussed that we would then follow up every 3 months. Check uric acid and liver function at the 3 month follow-up. No liver function test at 4 week follow-up.  Follow-Up Instructions: Return in about 4 weeks (around 02/09/2017).   Ortho Exam Examination patient is alert oriented no adenopathy well-dressed normal affect normal respiratory effort she has a normal gait. Examination of the right ankle there is no pain with range of motion there is no redness no cellulitis. Imaging: No results found.  Orders:  No orders of the defined types were placed in this encounter.  No orders of the defined types were placed in this encounter.    Procedures: No procedures performed  Clinical Data: No additional findings.  Subjective: Review of Systems  Objective: Vital Signs: There were no vitals taken for this visit.  Specialty Comments:  No  specialty comments available.  PMFS History: Patient Active Problem List   Diagnosis Date Noted  . 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 Influenza with respiratory manifestation 12/28/2016  . Acute kidney injury (HCC) 12/28/2016  . CKD (chronic kidney disease) stage 3, GFR 30-59 ml/min 12/28/2016  . Hypoxia 12/27/2016  . Idiopathic chronic gout of right ankle without tophus 11/23/2016  . Pain in right foot 11/23/2016  . 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 Essential hypertension 07/06/2015  . Fibromyalgia 07/06/2015  . Dyspnea 07/04/2015  . Diabetic neuropathy (HCC) 12/10/2014   Past Medical History:  Diagnosis Date  . Carpal tunnel syndrome   . Chronic pain   . Depression   . Diabetes mellitus   . Fibromyalgia   . GERD (gastroesophageal reflux disease)   . Hernia   . Hypertension   . Miscarriage    twins  . Peripheral neuropathy (HCC)   . TMJ syndrome     Family History  Problem Relation Age of Onset  . Aneurysm Father     Deceased, 7  . CAD Father   . Heart disease Father   . Cancer Father   . Pancreatic cancer Mother     Deceased, 65    Past Surgical History:  Procedure Laterality Date  . ABDOMINAL SURGERY    . TONSILLECTOMY     Social  History   Occupational History  . Not on file.   Social History Main Topics  . Smoking status: Former Games developermoker  . Smokeless tobacco: Never Used  . Alcohol use No  . Drug use: No  . Sexual activity: Not on file

## 2017-02-01 NOTE — Progress Notes (Signed)
Late entry for missing G-code.    12/28/16 1134  PT G-Codes **NOT FOR INPATIENT CLASS**  Functional Assessment Tool Used Clinical judgement  Functional Limitation Mobility: Walking and moving around  Mobility: Walking and Moving Around Current Status [phone](G8978) CI  Mobility: Walking and Moving Around Goal Status (U0454(G8979) CI  Christiane HaBenjamin J. Eldred Lievanos, PT, CSCS Pager [phone]

## 2017-02-02 ENCOUNTER — Institutional Professional Consult (permissible substitution): Payer: Medicare Other | Admitting: Internal Medicine

## 2017-02-09 ENCOUNTER — Ambulatory Visit (INDEPENDENT_AMBULATORY_CARE_PROVIDER_SITE_OTHER): Payer: Medicare Other | Admitting: Orthopedic Surgery

## 2017-02-09 ENCOUNTER — Other Ambulatory Visit (INDEPENDENT_AMBULATORY_CARE_PROVIDER_SITE_OTHER): Payer: Self-pay | Admitting: Orthopedic Surgery

## 2017-02-10 ENCOUNTER — Ambulatory Visit (INDEPENDENT_AMBULATORY_CARE_PROVIDER_SITE_OTHER): Payer: Medicare Other | Admitting: Orthopedic Surgery

## 2017-02-16 ENCOUNTER — Encounter (INDEPENDENT_AMBULATORY_CARE_PROVIDER_SITE_OTHER): Payer: Self-pay

## 2017-02-16 ENCOUNTER — Ambulatory Visit (INDEPENDENT_AMBULATORY_CARE_PROVIDER_SITE_OTHER): Payer: BLUE CROSS/BLUE SHIELD | Admitting: Family

## 2017-02-16 DIAGNOSIS — M1A09X0 Idiopathic chronic gout, multiple sites, without tophus (tophi): Secondary | ICD-10-CM | POA: Diagnosis not present

## 2017-02-16 DIAGNOSIS — L84 Corns and callosities: Secondary | ICD-10-CM

## 2017-02-17 NOTE — Progress Notes (Signed)
Office Visit Note   Patient: Erin [name]           Date of Birth: 06/29/1956           MRN: [id number] Visit Date: 02/16/2017              Requested by: Samuel Jester, DO [address] [place], [place] [address] PCP: Samuel Jester, DO  No chief complaint on file.     HPI: The patient is a 61 year old woman who presents today for 2 separate issues. First tissue issues here in follow-up for her gout. States she has not had any recent flares. Continues taking allopurinol 100 mg twice daily.  Has bilateral heel calluses with some cracks. Her husband states they have been applying Eucerin covered by gauze and Coban and at night. She is in regular socks and shoes during the day. The left heel has worsened deep fissures these are bleeding today. Patient states she has no pain with this as she has insensate neuropathy.  Assessment & Plan: Visit Diagnoses:  1. Idiopathic chronic gout of multiple sites without tophus   2. Pre-ulcerative calluses     Plan: They will continue using the Eucerin cream on bilateral heels. Recommended that they do this around the clock not just overnight. He'll follow up for recheck of the heels and 6 weeks. 3 months for uric acid and liver function  Follow-Up Instructions: Return in about 6 weeks (around 03/30/2017) for and 3 months for uric acid reck.   Ortho Exam  Patient is alert, oriented, no adenopathy, well-dressed, normal affect, normal respiratory effort. Non pitting edema bilateral lower extremities. Bilateral heels are dry and cracking. The left is worse that right fissures bilaterally. There is deep fissure to left heel this is bleeding today. Left heel was debrided with a 10 blade knife back to viable tissue. No drainage, erythema or sign of infection. Fissures involve skin only, no fat or tendon exposed.  Imaging: No results found.  Labs: Lab Results  Component Value Date   HGBA1C 6.1 (H) 12/28/2016   HGBA1C 6.0 (H) 07/04/2015   HGBA1C  5.6 12/10/2014   ESRSEDRATE 90 (H) 07/05/2015   ESRSEDRATE 45 (H) 12/10/2014   LABURIC 6.5 11/23/2016   LABURIC 8.3 (H) 11/02/2016    Orders:  Orders Placed This Encounter  Procedures  . Uric acid   No orders of the defined types were placed in this encounter.    Procedures: No procedures performed  Clinical Data: No additional findings.  ROS:  All other systems negative, except as noted in the HPI. Review of Systems  Constitutional: Negative for chills and fever.  Cardiovascular: Positive for leg swelling.  Skin: Positive for wound. Negative for color change.    Objective: Vital Signs: There were no vitals taken for this visit.  Specialty Comments:  No specialty comments available.  PMFS History: Patient Active Problem List   Diagnosis Date Noted  . 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 Influenza with respiratory manifestation 12/28/2016  . Acute kidney injury (HCC) 12/28/2016  . CKD (chronic kidney disease) stage 3, GFR 30-59 ml/min 12/28/2016  . Hypoxia 12/27/2016  . Idiopathic chronic gout of right ankle without tophus 11/23/2016  . Pain in right foot 11/23/2016  . 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 Essential hypertension 07/06/2015  . Fibromyalgia 07/06/2015  . Dyspnea 07/04/2015  . Diabetic neuropathy (HCC) 12/10/2014   Past Medical History:  Diagnosis Date  .  Carpal tunnel syndrome   . Chronic pain   . Depression   . Diabetes mellitus   . Fibromyalgia   . GERD (gastroesophageal reflux disease)   . Hernia   . Hypertension   . Miscarriage    twins  . Peripheral neuropathy (HCC)   . TMJ syndrome     Family History  Problem Relation Age of Onset  . Aneurysm Father     Deceased, 48  . CAD Father   . Heart disease Father   . Cancer Father   . Pancreatic cancer Mother     Deceased, 36    Past Surgical History:  Procedure Laterality Date  . ABDOMINAL SURGERY      . TONSILLECTOMY     Social History   Occupational History  . Not on file.   Social History Main Topics  . Smoking status: Former Games developer  . Smokeless tobacco: Never Used  . Alcohol use No  . Drug use: No  . Sexual activity: Not on file

## 2017-02-18 ENCOUNTER — Other Ambulatory Visit (INDEPENDENT_AMBULATORY_CARE_PROVIDER_SITE_OTHER): Payer: Self-pay

## 2017-02-18 ENCOUNTER — Other Ambulatory Visit (INDEPENDENT_AMBULATORY_CARE_PROVIDER_SITE_OTHER): Payer: Self-pay | Admitting: Family

## 2017-02-18 LAB — URIC ACID: Uric Acid, Serum: 8.4 mg/dL — ABNORMAL HIGH (ref 2.5–7.0)

## 2017-02-18 MED ORDER — ALLOPURINOL 100 MG PO TABS: 100.0000 mg | ORAL_TABLET | Freq: Two times a day (BID) | ORAL | 3 refills | Status: DC

## 2017-03-07 ENCOUNTER — Encounter: Payer: Self-pay | Admitting: Neurology

## 2017-03-07 ENCOUNTER — Ambulatory Visit (INDEPENDENT_AMBULATORY_CARE_PROVIDER_SITE_OTHER): Payer: BLUE CROSS/BLUE SHIELD | Admitting: Neurology

## 2017-03-07 VITALS — BP 120/80 | HR 82 | Ht 63.0 in | Wt 253.6 lb

## 2017-03-07 DIAGNOSIS — E0842 Diabetes mellitus due to underlying condition with diabetic polyneuropathy: Secondary | ICD-10-CM | POA: Diagnosis not present

## 2017-03-07 DIAGNOSIS — G5712 Meralgia paresthetica, left lower limb: Secondary | ICD-10-CM

## 2017-03-07 MED ORDER — GABAPENTIN 300 MG PO CAPS: 900.0000 mg | ORAL_CAPSULE | Freq: Three times a day (TID) | ORAL | 11 refills | Status: DC

## 2017-03-07 NOTE — Progress Notes (Signed)
Follow-up Visit   Date: 03/07/17    Erin [name] MRN: [id number] DOB: 1956-01-05   Interim History: Erin [name] is a 61 y.o. right-handed Caucasian female with GERD, fibromyalgia, hypertension, depression, migraine, diabetes mellitus type 2, vitamin B12 deficiency, chronic pain syndrome returning to the clinic for follow-up of diabetic neuropathy.  The patient was accompanied to the clinic by self.  History of present illness: Starting around 2013, she developed sudden onset of numbness/tingling involving her right foot and within three months, she developed similar symptoms on the right. She also complains of burning sensation and intermittent stabbing pain over the arch of the feet.Discomfort is now at the level of mid-calf and involves her ankles and feet. She was started on Lyrica 114m TID which seems to alleviate some of the pain. She takes oxycodone 357mevery 4-5 hours. In the fall of 2015, she developed intermittent numbness/tingling of the hands which is worse in the morning.   She walks with a cane for balance. No recent falls. 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     She also seen podiatry and a chiropractor. She was told her spine was totally out of alignment and received laser therapy to her feet for three weeks, but felt even worse so stopped going.   UPDATE 03/11/2015:  Since increasing Lyrica to 20026mhree times daily, she has noticed improvement of her pain, because she no longer has stabbing pain.  She was able to reduce her oxycodone to 33m34m4 times per day, whereas previously she was taking 4-5.  She continues to have spells of hot/cold sensation, but is overall pleased with her improvement.    UPDATE 09/04/2015:  She was hospitalized in August due to leg swelling and shortness of breath.  She was treated for possible CHF and due to concern of Lyrica causing edema, it was reduced to Lyrica 50mg27mly.  She does not  notice any benefit with Lyrica 50mg 94mis interested in trying alternative medication.  She has lost 34lb since her admission.  For pain, she is back to taking oxycodone 4 times per day, which controls her pain.    UPDATE 12/24/2014:  She has noticed mild improvement in paresthesias since increasing gabapentin to 600mg T5m She gait is affected by her pain because she always feels as if she is walking on needles, but fortunately, she had not had any falls.     UPDATE 04/23/2016:  She unfortunately has gained weight with gabapentin so would like to discontinue it. Nortriptyline was stopped due to dry mouth.     UPDATE 08/23/2016:  She did not tolerate Cymbalta because it made her nauseous.  When she attempted tapering her gabapentin, she felt they her pain was worse so restarted this as she was taking 600-600-900mg.  36mcontinues to take oxycodone 33mg thr73mimes daily, which she was previously taking 6 times daily.  She has some tingling in the hands, and denies weakness, but is concerned that her neuropathy may be affecting her hands.   UPDATE 03/07/2017:  She is here for 6 month follow-up appointment.  Her neuropathy is stable and well controlled on gabapentin 600-600-900mg, but62m also takes oxycodone 33mg TID a43mnable to reduce this dose.  She also complains of numbness/tingling over the left lateral thigh, which has become more constant. She denies any weakness of the legs or recent weight gain.  Diabetes is well controlled with last HbA1c 6.1  Medications:  Current Outpatient Prescriptions on File Prior to Visit  Medication Sig Dispense Refill  . Albuterol Sulfate 108 (90 Base) MCG/ACT AEPB Inhale 2 puffs into the lungs every 6 (six) hours as needed (for wheezing). 1 each 0  . allopurinol (ZYLOPRIM) 100 MG tablet Take 1 tablet (100 mg total) by mouth 2 (two) times daily. Take 2 tablets by mouth 2 times daily. 120 tablet 3  . benazepril-hydrochlorthiazide (LOTENSIN HCT) 20-25 MG per tablet Take  1 tablet by mouth daily.    . colchicine 0.6 MG tablet Take 1 tablet (0.6 mg total) by mouth 2 (two) times daily. Twice daily until gout flare resolves 60 tablet 3  . levothyroxine (SYNTHROID, LEVOTHROID) 50 MCG tablet Take 50 mcg by mouth daily before breakfast.   10  . metformin (FORTAMET) 500 MG (OSM) 24 hr tablet Take 500 mg by mouth daily with breakfast.    . omeprazole (PRILOSEC) 40 MG capsule Take 40 mg by mouth daily.    Marland Kitchen oseltamivir (TAMIFLU) 75 MG capsule Take 1 capsule (75 mg total) by mouth 2 (two) times daily. 8 capsule 0  . oxycodone (ROXICODONE) 30 MG immediate release tablet Take 30 mg by mouth every 4 (four) hours as needed for pain.    . SUMAtriptan (IMITREX) 100 MG tablet Take 100 mg by mouth every 2 (two) hours as needed for migraine.   9   No current facility-administered medications on file prior to visit.     Allergies: No Known Allergies  Review of Systems:  CONSTITUTIONAL: No fevers, chills, night sweats, or weight loss.  EYES: No visual changes or eye pain ENT: No hearing changes.  No history of nose bleeds.   RESPIRATORY: No cough, wheezing and shortness of breath.   CARDIOVASCULAR: Negative for chest pain, and palpitations.   GI: Negative for abdominal discomfort, blood in stools or black stools.  No recent change in bowel habits.   GU:  No history of incontinence.   MUSCLOSKELETAL: No history of joint pain or swelling.  No myalgias.   SKIN: Negative for lesions, rash, and itching.   ENDOCRINE: Negative for cold or heat intolerance, polydipsia or goiter.   PSYCH:  No depression or anxiety symptoms.   NEURO: As Above.   Vital Signs:  BP 120/80   Pulse 82   Ht _0  (1.6 m)   Wt 253 lb 9 oz (115 kg)   SpO2 96%   BMI 44.92 kg/m   Neurological Exam: MENTAL STATUS including orientation to time, place, person, recent and remote memory, attention span and concentration, language, and fund of knowledge is normal.  Speech is not dysarthric.  CRANIAL  NERVES:  Pupils equal round and reactive to light.  Normal conjugate, extra-ocular eye movements in all directions of gaze.  No ptosis.  Face is symmetric.   MOTOR:  Motor strength is 5/5 in all extremities.    SENSORY: Vibration absent distal to ankles bilaterally.  COORDINATION/GAIT:  Gait is slightly wide-based due to body habitus  Data: Labs 12/16/2014:  ESR 45, vitamin B12 348, vitamin B6 6.5, copper 131, TSH 7.6, fT3 2.4, fT4 0.88, HbA1c 5.6  NCS/EMG of the right > left upper extremity 09/21/2016:  The electrode diagnostic testing is most consistent with a chronic sensorimotor polyneuropathy, axon loss in type, affecting the right upper extremity. Overall, these findings are mild in degree electrically.  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, likely idiopathic as her diabetes has been well-controlled  - Symptoms previously well-controlled on Lyrica 282m  BID, but she developed bilateral leg edema so it was discontinued  - Previously tried:  Nortriptyline (dry mouth), Lyrica (bilateral leg edema), lidocaine ointment, Cymbalta (sick)  - Increase gabapentin 935m to three times daily  - I stressed that the goal is to reduce the amount of oxycodone, which she is slowly doing  Left meralgia paresthetica  - Weight loss encouraged  - OK to use lidocaine ointment as needed to left thigh  Return to clinic in 6 months   The duration of this appointment visit was 25 minutes of face-to-face time with the patient.  Greater than 50% of this time was spent in counseling, explanation of diagnosis, planning of further management, and coordination of care.   Thank you for allowing me to participate in patient's care.  If I can answer any additional questions, I would be pleased to do so.    Sincerely,    Fate Caster K. PPosey Pronto DO

## 2017-03-07 NOTE — Patient Instructions (Addendum)
1.  Increase gabapentin to  three times daily 2.  Try lidocaine ointment to the left thigh 3.  Weight loss encouraged  Return to clinic 6 months

## 2017-03-08 ENCOUNTER — Institutional Professional Consult (permissible substitution): Payer: Medicare Other | Admitting: Internal Medicine

## 2017-03-09 ENCOUNTER — Ambulatory Visit (INDEPENDENT_AMBULATORY_CARE_PROVIDER_SITE_OTHER): Payer: BLUE CROSS/BLUE SHIELD | Admitting: Pulmonary Disease

## 2017-03-09 ENCOUNTER — Encounter: Payer: Self-pay | Admitting: Pulmonary Disease

## 2017-03-09 ENCOUNTER — Other Ambulatory Visit (INDEPENDENT_AMBULATORY_CARE_PROVIDER_SITE_OTHER): Payer: BLUE CROSS/BLUE SHIELD

## 2017-03-09 VITALS — BP 128/82 | HR 91 | Resp 16 | Ht 63.0 in | Wt 257.8 lb

## 2017-03-09 DIAGNOSIS — R0902 Hypoxemia: Secondary | ICD-10-CM

## 2017-03-09 DIAGNOSIS — Z6841 Body Mass Index (BMI) 40.0 and over, adult: Secondary | ICD-10-CM

## 2017-03-09 DIAGNOSIS — I272 Pulmonary hypertension, unspecified: Secondary | ICD-10-CM

## 2017-03-09 DIAGNOSIS — R0609 Other forms of dyspnea: Secondary | ICD-10-CM

## 2017-03-09 LAB — CBC
HCT: 25 % — ABNORMAL LOW (ref 36.0–46.0)
Hemoglobin: 8.2 g/dL — ABNORMAL LOW (ref 12.0–15.0)
MCHC: 32.8 g/dL (ref 30.0–36.0)
MCV: 87.5 fl (ref 78.0–100.0)
PLATELETS: 398 10*3/uL (ref 150.0–400.0)
RBC: 2.85 Mil/uL — ABNORMAL LOW (ref 3.87–5.11)
RDW: 17 % — ABNORMAL HIGH (ref 11.5–15.5)
WBC: 12.9 10*3/uL — ABNORMAL HIGH (ref 4.0–10.5)

## 2017-03-09 LAB — COMPREHENSIVE METABOLIC PANEL
ALBUMIN: 3.8 g/dL (ref 3.5–5.2)
ALT: 7 U/L (ref 0–35)
AST: 15 U/L (ref 0–37)
Alkaline Phosphatase: 125 U/L — ABNORMAL HIGH (ref 39–117)
BILIRUBIN TOTAL: 0.3 mg/dL (ref 0.2–1.2)
BUN: 19 mg/dL (ref 6–23)
CALCIUM: 9.8 mg/dL (ref 8.4–10.5)
CO2: 35 mEq/L — ABNORMAL HIGH (ref 19–32)
CREATININE: 1.99 mg/dL — AB (ref 0.40–1.20)
Chloride: 85 mEq/L — ABNORMAL LOW (ref 96–112)
GFR: 27.28 mL/min — ABNORMAL LOW (ref >60.00)
Glucose, Bld: 99 mg/dL (ref 70–99)
Potassium: 4.2 mEq/L (ref 3.5–5.1)
Sodium: 126 mEq/L — ABNORMAL LOW (ref 135–145)
Total Protein: 7.1 g/dL (ref 6.0–8.3)

## 2017-03-09 LAB — BRAIN NATRIURETIC PEPTIDE: PRO B NATRI PEPTIDE: 51 pg/mL (ref 0.0–100.0)

## 2017-03-09 NOTE — Progress Notes (Signed)
Past surgical history She  has a past surgical history that includes Tonsillectomy; Abdominal surgery; Partial hysterectomy (1983); Vaginal hysterectomy (2002); and Tubal ligation (1983).  Family history Her family history includes Aneurysm in her father; CAD in her father; Cancer in her father; Heart disease in her father; Pancreatic cancer in her mother.  Social history She  reports that she quit smoking about 8 years ago. Her smoking use included Cigarettes. She has a 32.00 pack-year smoking history. She has never used smokeless tobacco. She reports that she does not drink alcohol or use drugs.   Review of Systems  Constitutional: Positive for appetite change. Negative for fever and unexpected weight change.  HENT: Negative for congestion, dental problem, ear pain, nosebleeds, postnasal drip, rhinorrhea, sinus pressure, sneezing, sore throat and trouble swallowing.   Eyes: Negative for redness and itching.  Respiratory: Positive for shortness of breath. Negative for cough, chest tightness and wheezing.   Cardiovascular: Negative for palpitations and leg swelling.  Gastrointestinal: Negative for nausea and vomiting.       Acid heartburn  Genitourinary: Negative for dysuria.  Musculoskeletal: Positive for joint swelling.  Skin: Negative for rash.  Neurological: Negative for headaches.  Hematological: Does not bruise/bleed easily.  Psychiatric/Behavioral: Negative for dysphoric mood. The patient is not nervous/anxious.     No Known Allergies  Current Outpatient Prescriptions on File Prior to Visit  Medication Sig  . Albuterol Sulfate 108 (90 Base) MCG/ACT AEPB Inhale 2 puffs into the lungs every 6 (six) hours as needed (for wheezing).  Marland Kitchen allopurinol (ZYLOPRIM) 100 MG tablet Take 1 tablet (100 mg total) by mouth 2 (two) times daily. Take 2 tablets by mouth 2 times daily.  . benazepril-hydrochlorthiazide (LOTENSIN HCT) 20-25 MG per tablet Take 1 tablet by mouth daily.  . colchicine 0.6  MG tablet Take 1 tablet (0.6 mg total) by mouth 2 (two) times daily. Twice daily until gout flare resolves  . gabapentin (NEURONTIN) 300 MG capsule Take 3 capsules (900 mg total) by mouth 3 (three) times daily.  Marland Kitchen levothyroxine (SYNTHROID, LEVOTHROID) 50 MCG tablet Take 50 mcg by mouth daily before breakfast.   . metformin (FORTAMET) 500 MG (OSM) 24 hr tablet Take 500 mg by mouth daily with breakfast.  . omeprazole (PRILOSEC) 40 MG capsule Take 40 mg by mouth daily.  Marland Kitchen oxycodone (ROXICODONE) 30 MG immediate release tablet Take 30 mg by mouth every 4 (four) hours as needed for pain.  . SUMAtriptan (IMITREX) 100 MG tablet Take 100 mg by mouth every 2 (two) hours as needed for migraine.    No current facility-administered medications on file prior to visit.     Chief Complaint  Patient presents with  . pulmonary consult    ref by Dr Samuel Jester for hypoxia and persistent cough. xray on 2/18     Cardiac tests Echo 12/28/16 >> EF 55 to 60%, PAS 37 mmHg  Past medical history She  has a past medical history of Carpal tunnel syndrome; Chronic pain; Depression; Diabetes mellitus; Fibromyalgia; GERD (gastroesophageal reflux disease); Hernia; Hyperlipemia; Hypertension; Miscarriage; Peripheral neuropathy; and TMJ syndrome.  Vital signs BP 128/82 (BP Location: Left Arm, Cuff Size: Normal)   Pulse 91   Resp 16   Ht  (1.6 m)   Wt 257 lb 12.8 oz (116.9 kg)   SpO2 93%   BMI 45.67 kg/m   History of present illness Erin [name] is a 61 y.o. female with dyspnea on exertion.  She has noticed trouble with her  breathing for a while.  She is not very activity.  She relates this to trouble with joint pains from arthritis and foot pain from neuropathy.  Her symptoms got much worse in February 2018 when she was found to have influenza.  Her chest xray from that time showed atelectasis.  She was noted to have low oxygen while in hospital, but never was set up for oxygen at home.  She smoked 1  ppd, and quit several years ago.  She is not aware of having asthma, COPD, pneumonia, or tuberculosis.  She is not aware of any heart problems.  She had Echo while in hospital and this showed elevated pulmonary artery pressures.  She denies cough, wheeze, chest pain, fever, sinus congestion, hemoptysis, skin rash, joint swelling, abdominal pain.  She gets ankle swelling, and this has gotten worse over the past several months.  She does have trouble with her sleep.  She snores, and her husband says she stops breathing while asleep.  She is a restless sleeper, and gets sleepy during the day.  Lowest SpO2 with ambulation today was 90% after 3 laps.  Physical exam  General - No distress Eyes - pupils reactive ENT - No sinus tenderness, no oral exudate, no LAN, no thyromegaly, TM clear, pupils equal/reactive, wears dentures, MP 3 Cardiac - s1s2 regular, no murmur, pulses symmetric Chest - No wheeze/rales/dullness, good air entry, normal respiratory excursion Back - No focal tenderness Abd - Soft, non-tender, no organomegaly, + bowel sounds Ext - 1+ ankle edema Neuro - Normal strength, cranial nerves intact Skin - No rashes Psych - Normal mood, and behavior   CMP Latest Ref Rng & Units 12/28/2016 12/27/2016 12/25/2015  Glucose 65 - 99 mg/dL 161(W) 960(A) 80  BUN 6 - 20 mg/dL 54(U) 98(J) 14  Creatinine 0.44 - 1.00 mg/dL 1.91(Y) 7.82(N) 5.62  Sodium 135 - 145 mmol/L 132(L) 131(L) 135  Potassium 3.5 - 5.1 mmol/L 4.7 4.6 4.2  Chloride 101 - 111 mmol/L 91(L) 91(L) 96  CO2 22 - 32 mmol/L Calcium 8.9 - 10.3 mg/dL 9.3 1.3(Y) 9.5  Total Protein 6.0 - 8.3 g/dL - - 7.5  Total Bilirubin 0.2 - 1.2 mg/dL - - 0.2  Alkaline Phos 39 - 117 U/L - - 135(H)  AST 0 - 37 U/L - - 31  ALT 0 - 35 U/L - - 21     CBC Latest Ref Rng & Units 12/27/2016 07/05/2015 07/04/2015  WBC 4.0 - 10.5 K/uL 7.4 9.2 8.6  Hemoglobin 12.0 - 15.0 g/dL 9.0(L) 9.6(L) 10.1(L)  Hematocrit 36.0 - 46.0 % 30.0(L) 29.6(L) 31.3(L)   Platelets 150 - 400 K/uL 315 284 289     Discussion She has progressive dyspnea on exertion.  She has prior history of smoking.  Recent echocardiogram showed elevated pulmonary pressures.  She reports snoring, apnea, and restless sleep.   Assessment/plan  Dyspnea on exertion with pulmonary hypertension. - will get CT angiogram chest, PFT - will get CBC, CMET, BNP  Snoring with concern for obstructive sleep apnea in setting of pulmonary hypertension. - will arrange for in lab sleep study  Morbid obesity with deconditioning. - discussed importance of weight loss - she would likely benefit from monitored exercise program   Patient Instructions  Lab tests today Will schedule pulmonary function test, CT angiogram of chest, and sleep study  Follow up in 4 weeks with Dr. Craige Cotta or Nurse Practitioner    Coralyn Helling, MD [facility] Pager:  [phone]  03/09/2017, 4:11 PM

## 2017-03-09 NOTE — Patient Instructions (Signed)
Lab tests today Will schedule pulmonary function test, CT angiogram of chest, and sleep study  Follow up in 4 weeks with Dr. Craige Cotta or Nurse Practitioner

## 2017-03-09 NOTE — Progress Notes (Signed)
   Subjective:    Patient ID: Erin [name], female    DOB: 1956-06-28, 61 y.o.   MRN: [id number]  HPI    Review of Systems  Constitutional: Positive for appetite change. Negative for fever and unexpected weight change.  HENT: Negative for congestion, dental problem, ear pain, nosebleeds, postnasal drip, rhinorrhea, sinus pressure, sneezing, sore throat and trouble swallowing.   Eyes: Negative for redness and itching.  Respiratory: Positive for shortness of breath. Negative for cough, chest tightness and wheezing.   Cardiovascular: Negative for palpitations and leg swelling.  Gastrointestinal: Negative for nausea and vomiting.       Acid heartburn  Genitourinary: Negative for dysuria.  Musculoskeletal: Positive for joint swelling.  Skin: Negative for rash.  Neurological: Negative for headaches.  Hematological: Does not bruise/bleed easily.  Psychiatric/Behavioral: Negative for dysphoric mood. The patient is not nervous/anxious.        Objective:   Physical Exam        Assessment & Plan:

## 2017-03-10 ENCOUNTER — Telehealth: Payer: Self-pay | Admitting: Pulmonary Disease

## 2017-03-10 DIAGNOSIS — R0602 Shortness of breath: Secondary | ICD-10-CM

## 2017-03-10 DIAGNOSIS — I272 Pulmonary hypertension, unspecified: Secondary | ICD-10-CM

## 2017-03-10 NOTE — Telephone Encounter (Signed)
Pt aware of VS's below message and voiced her understanding. CT chest wo has been ordered.  Labs have been faxed to provided number. Received successful fax confirmation. Nothing further needed at this time.

## 2017-03-10 NOTE — Telephone Encounter (Signed)
CMP Latest Ref Rng & Units 03/09/2017 12/28/2016 12/27/2016  Glucose 70 - 99 mg/dL 99 657(Q) 469(G)  BUN 6 - 23 mg/dL 19 29(B) 28(U)  Creatinine 0.40 - 1.20 mg/dL 1.32(G) 4.01(U) 2.72(Z)  Sodium 135 - 145 mEq/L 126(L) 132(L) 131(L)  Potassium 3.5 - 5.1 mEq/L 4.2 4.7 4.6  Chloride 96 - 112 mEq/L 85(L) 91(L) 91(L)  CO2 19 - 32 mEq/L 35(H) 29 27  Calcium 8.4 - 10.5 mg/dL 9.8 9.3 3.6(U)  Total Protein 6.0 - 8.3 g/dL 7.1 - -  Total Bilirubin 0.2 - 1.2 mg/dL 0.3 - -  Alkaline Phos 39 - 117 U/L 125(H) - -  AST 0 - 37 U/L 15 - -  ALT 0 - 35 U/L 7 - -    CBC Latest Ref Rng & Units 03/09/2017 12/27/2016 07/05/2015  WBC 4.0 - 10.5 K/uL 12.9(H) 7.4 9.2  Hemoglobin 12.0 - 15.0 g/dL 8.2 Repeated and verified X2.(L) 9.0(L) 9.6(L)  Hematocrit 36.0 - 46.0 % 25.0 Repeated and verified X2.(L) 30.0(L) 29.6(L)  Platelets 150.0 - 400.0 K/uL 398.0 315 284    BNP    Component Value Date/Time   BNP 190.3 (H) 12/27/2016 0924    ProBNP    Component Value Date/Time   PROBNP 51.0 03/09/2017 1645     Attempted to call pt.    Will have my nurse inform pt that lab tests shows anemia, low sodium, and elevated kidney function.  She won't be able to do CT chest with contrast due to elevated kidney test.  Please change to CT chest w/o contrast.  Please inform pt that I have contacted Dr. Silvana Newness office, and they will follow up on blood test results.  Please fax lab results to [phone].

## 2017-03-16 ENCOUNTER — Ambulatory Visit (INDEPENDENT_AMBULATORY_CARE_PROVIDER_SITE_OTHER): Payer: BLUE CROSS/BLUE SHIELD | Admitting: Orthopedic Surgery

## 2017-03-17 ENCOUNTER — Ambulatory Visit (INDEPENDENT_AMBULATORY_CARE_PROVIDER_SITE_OTHER)
Admission: RE | Admit: 2017-03-17 | Discharge: 2017-03-17 | Disposition: A | Discharge status: Home / Self Care | Payer: BLUE CROSS/BLUE SHIELD | Source: Ambulatory Visit | Attending: Pulmonary Disease | Admitting: Pulmonary Disease

## 2017-03-17 ENCOUNTER — Other Ambulatory Visit: Payer: BLUE CROSS/BLUE SHIELD

## 2017-03-17 ENCOUNTER — Ambulatory Visit (INDEPENDENT_AMBULATORY_CARE_PROVIDER_SITE_OTHER): Payer: BLUE CROSS/BLUE SHIELD | Admitting: Pulmonary Disease

## 2017-03-17 ENCOUNTER — Inpatient Hospital Stay: Admission: RE | Admit: 2017-03-17 | Payer: BLUE CROSS/BLUE SHIELD | Source: Ambulatory Visit

## 2017-03-17 DIAGNOSIS — I10 Essential (primary) hypertension: Secondary | ICD-10-CM | POA: Diagnosis not present

## 2017-03-17 DIAGNOSIS — R0602 Shortness of breath: Secondary | ICD-10-CM | POA: Diagnosis not present

## 2017-03-17 DIAGNOSIS — R0609 Other forms of dyspnea: Secondary | ICD-10-CM | POA: Diagnosis not present

## 2017-03-17 DIAGNOSIS — I272 Pulmonary hypertension, unspecified: Secondary | ICD-10-CM

## 2017-03-17 DIAGNOSIS — R0902 Hypoxemia: Secondary | ICD-10-CM

## 2017-03-17 NOTE — Progress Notes (Signed)
PFT done today. 

## 2017-03-18 ENCOUNTER — Inpatient Hospital Stay: Admission: RE | Admit: 2017-03-18 | Payer: BLUE CROSS/BLUE SHIELD | Source: Ambulatory Visit

## 2017-03-21 ENCOUNTER — Ambulatory Visit (INDEPENDENT_AMBULATORY_CARE_PROVIDER_SITE_OTHER): Payer: BLUE CROSS/BLUE SHIELD | Admitting: Orthopedic Surgery

## 2017-03-21 ENCOUNTER — Encounter (INDEPENDENT_AMBULATORY_CARE_PROVIDER_SITE_OTHER): Payer: Self-pay | Admitting: Orthopedic Surgery

## 2017-03-21 VITALS — Ht 63.0 in | Wt 257.0 lb

## 2017-03-21 DIAGNOSIS — I87323 Chronic venous hypertension (idiopathic) with inflammation of bilateral lower extremity: Secondary | ICD-10-CM | POA: Diagnosis not present

## 2017-03-21 DIAGNOSIS — E1142 Type 2 diabetes mellitus with diabetic polyneuropathy: Secondary | ICD-10-CM

## 2017-03-21 DIAGNOSIS — L97421 Non-pressure chronic ulcer of left heel and midfoot limited to breakdown of skin: Secondary | ICD-10-CM | POA: Diagnosis not present

## 2017-03-21 NOTE — Progress Notes (Signed)
Office Visit Note   Patient: Erin [name]           Date of Birth: 1955/12/08           MRN: [id number] Visit Date: 03/21/2017              Requested by: Samuel Jester, DO [address] [place], [place] [address] PCP: Samuel Jester, DO  Chief Complaint  Patient presents with  . Left Foot - Follow-up    6 week rov callus lateral heel  . Right Foot - Follow-up    Persistent swelling status post great toe amputation      HPI: Patient is a 61 year old woman diabetic insensate neuropathy venous stasis insufficiency with a Wagner grade 1 ulcer on the lateral aspect left heel and lateral aspect of the right foot. Patient states that she has been wearing compression stockings but she states these do not help with the swelling. Patient feels like her ulcers are getting better. She is been using Eucerin cream.  Assessment & Plan: Visit Diagnoses:  1. Diabetic polyneuropathy associated with type 2 diabetes mellitus (HCC)   2. Ulcer of left heel, limited to breakdown of skin (HCC)     Plan: The ulcers were debrided of skin and soft tissue. Patient will continue wearing her compression stockings continue dry dressing changes daily she will bring her stockings with her follow-up we may need to increase her compression to 20/30 compression.  Follow-Up Instructions: Return in about 4 weeks (around 04/18/2017).   Ortho Exam  Patient is alert, oriented, no adenopathy, well-dressed, normal affect, normal respiratory effort. Patient has an antalgic gait. Examination she has pitting edema both lower extremities but no open ulcers no cellulitis. Examination she has a Wagner grade 1 ulcer on the left heel and lateral aspect the right foot. After informed consent a 10 blade knife was used to debride the skin and soft tissue back to healthy viable granulation tissue. Silver nitrate was used for hemostasis of Band-Aid was applied. The left heel ulcer is 3 cm in diameter 1 mm deep the right foot  ulcer as 2 cm in diameter and 1 mm deep there is no exposed bone tendon no signs of infection.  Imaging: No results found.  Labs: Lab Results  Component Value Date   HGBA1C 6.1 (H) 12/28/2016   HGBA1C 6.0 (H) 07/04/2015   HGBA1C 5.6 12/10/2014   ESRSEDRATE 90 (H) 07/05/2015   ESRSEDRATE 45 (H) 12/10/2014   LABURIC 8.4 (H) 02/16/2017   LABURIC 6.5 11/23/2016   LABURIC 8.3 (H) 11/02/2016    Orders:  No orders of the defined types were placed in this encounter.  No orders of the defined types were placed in this encounter.    Procedures: No procedures performed  Clinical Data: No additional findings.  ROS:  All other systems negative, except as noted in the HPI. Review of Systems  Objective: Vital Signs: Ht 5\' 3"  (1.6 m)   Wt 257 lb (116.6 kg)   BMI 45.53 kg/m   Specialty Comments:  No specialty comments available.  PMFS History: Patient Active Problem List   Diagnosis Date Noted  . Ulcer of left heel, limited to breakdown of skin (HCC) 03/21/2017  . 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 Influenza with respiratory manifestation 12/28/2016  . Acute kidney injury (HCC) 12/28/2016  . CKD (chronic kidney disease) stage 3, GFR 30-59 ml/min 12/28/2016  . Hypoxia 12/27/2016  . Idiopathic chronic gout  of right ankle without tophus 11/23/2016  . Pain in right foot 11/23/2016  . 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 Essential hypertension 07/06/2015  . Fibromyalgia 07/06/2015  . Dyspnea 07/04/2015  . Diabetic neuropathy (HCC) 12/10/2014   Past Medical History:  Diagnosis Date  . Carpal tunnel syndrome   . Chronic pain   . Depression   . Diabetes mellitus   . Fibromyalgia   . GERD (gastroesophageal reflux disease)   . Hernia   . Hyperlipemia   . Hypertension   . Miscarriage    twins  . Peripheral neuropathy   . TMJ syndrome     Family History  Problem Relation Age of Onset  .  Aneurysm Father     Deceased, 3982  . CAD Father   . Heart disease Father   . Cancer Father   . Pancreatic cancer Mother     Deceased, 6767    Past Surgical History:  Procedure Laterality Date  . ABDOMINAL SURGERY    . PARTIAL HYSTERECTOMY  1983  . TONSILLECTOMY    . TUBAL LIGATION  1983  . VAGINAL HYSTERECTOMY  2002   Social History   Occupational History  . disabled    Social History Main Topics  . Smoking status: Former Smoker    Packs/day: 1.00    Years: 32.00    Types: Cigarettes    Quit date: 11/15/2008  . Smokeless tobacco: Never Used  . Alcohol use No  . Drug use: No  . Sexual activity: Not on file

## 2017-03-29 LAB — PULMONARY FUNCTION TEST
DL/VA % pred: 102 %
DL/VA: 4.55 ml/min/mmHg/L
DLCO COR % PRED: 83 %
DLCO cor: 17.23 ml/min/mmHg
DLCO unc % pred: 74 %
DLCO unc: 15.33 ml/min/mmHg
FEF 25-75 POST: 1.9 L/s
FEF 25-75 PRE: 1.38 L/s
FEF2575-%Change-Post: 37 %
FEF2575-%PRED-PRE: 61 %
FEF2575-%Pred-Post: 84 %
FEV1-%Change-Post: 8 %
FEV1-%PRED-POST: 70 %
FEV1-%PRED-PRE: 65 %
FEV1-POST: 1.65 L
FEV1-Pre: 1.53 L
FEV1FVC-%Change-Post: 1 %
FEV1FVC-%PRED-PRE: 102 %
FEV6-%CHANGE-POST: 6 %
FEV6-%PRED-PRE: 65 %
FEV6-%Pred-Post: 69 %
FEV6-POST: 2.02 L
FEV6-Pre: 1.89 L
FEV6FVC-%CHANGE-POST: 0 %
FEV6FVC-%PRED-POST: 104 %
FEV6FVC-%Pred-Pre: 103 %
FVC-%CHANGE-POST: 6 %
FVC-%Pred-Post: 67 %
FVC-%Pred-Pre: 63 %
FVC-Post: 2.03 L
FVC-Pre: 1.91 L
POST FEV6/FVC RATIO: 100 %
PRE FEV1/FVC RATIO: 80 %
Post FEV1/FVC ratio: 82 %
Pre FEV6/FVC Ratio: 99 %
RV % pred: 117 %
RV: 2.16 L
TLC % PRED: 93 %
TLC: 4.36 L

## 2017-03-31 ENCOUNTER — Telehealth: Payer: Self-pay | Admitting: Pulmonary Disease

## 2017-03-31 NOTE — Telephone Encounter (Signed)
PFT 03/17/17 >> FEV1 1.65 (70%), FEV1% 82, TLC 4.36 (93%), DLCO 74%, no BD CT chest 03/17/17 >> small peripheral densities in RUL and RLL   Spoke with pt.    Uncertain if CT chest findings are of significance.  No respiratory symptoms to suggest infection.  Likely will need f/u CT chest in 3 months to reassess  PFT findings non specific and could be related to body habitus.  She has ROV on 04/06/17 with Kandice RobinsonsSarah Groce.  Will have test results reviewed in more detail then.

## 2017-04-06 ENCOUNTER — Ambulatory Visit (INDEPENDENT_AMBULATORY_CARE_PROVIDER_SITE_OTHER): Payer: BLUE CROSS/BLUE SHIELD | Admitting: Acute Care

## 2017-04-06 ENCOUNTER — Encounter: Payer: Self-pay | Admitting: Acute Care

## 2017-04-06 VITALS — BP 126/68 | HR 81 | Ht 62.0 in | Wt 250.6 lb

## 2017-04-06 DIAGNOSIS — Z9189 Other specified personal risk factors, not elsewhere classified: Secondary | ICD-10-CM

## 2017-04-06 DIAGNOSIS — R0602 Shortness of breath: Secondary | ICD-10-CM | POA: Diagnosis not present

## 2017-04-06 DIAGNOSIS — R0600 Dyspnea, unspecified: Secondary | ICD-10-CM

## 2017-04-06 DIAGNOSIS — I272 Pulmonary hypertension, unspecified: Secondary | ICD-10-CM | POA: Diagnosis not present

## 2017-04-06 NOTE — Progress Notes (Signed)
History of Present Illness Erin [name] is a 61 y.o. female former smoker ( 32-pack-year smoking history quit in 2010)  with dyspnea on exertion. She is followed by Dr. Craige Cotta   04/06/2017 Follow up: Pt. Presents for follow up of evaluation for dyspnea on exertion. She was seen by Dr. Craige Cotta on 03/09/2017. Assessment and plan at that visit were:  Dyspnea on exertion with pulmonary hypertension. - will get CT angiogram chest, PFT - will get CBC, CMET, BNP  Snoring with concern for obstructive sleep apnea in setting of pulmonary hypertension. - will arrange for in lab sleep study  Morbid obesity with deconditioning. - discussed importance of weight loss - she would likely benefit from monitored exercise program if she qualifies.   Patient states Dr. Craige Cotta did call her with the results of her CT Angiogram Chest, PFT's and spirometry. She understands that the findings are relatively nonspecific. She understands they could be related to her pulmonary hypertension which is mild with a PA peak pressure reading of 37 mmHg. She states she does have continued shortness of breath with exertion. She is using her albuterol rescue inhaler less than once daily. She has not yet had the split-night study to evaluate her for obstructive sleep apnea. She states her weight has been stable for the last year. We did discuss the need to exercise, and lose weight. She lives in [facility], and states there are few facilities that offer her the ability to exercise. She states she has neuropathies in her legs which make it hard for her to exercise. We will offer her pulmonary rehabilitation, however I'm unsure which location will work for her. She denies fever, chest pain, orthopnea, or hemoptysis.   Test Results: PFT 03/17/17 >> FEV1 1.65 (70%), FEV1% 82, TLC 4.36 (93%), DLCO 74%, no BD: Non-specific and could be related to body habitus.>> Restrictive deficit which lung volumes don't confirm. Mild  diffusion deficit. CT chest 03/17/17 >> small peripheral densities in RUL and RLL.  Uncertain if CT chest findings are of significance.  No respiratory symptoms to suggest infection.  Likely will need f/u CT chest in 3 months to reassess  03/09/2017: Ambulatory saturation: Lowest SpO2 with ambulation today was 90% after 3 laps.  2-D echo: 12/28/2016 - The right ventricular systolic pressure was increased consistent   with mild pulmonary hypertension: Pulmonary arteries: PA peak pressure: 37 mm Hg (S).   CBC Latest Ref Rng & Units 03/09/2017 12/27/2016 07/05/2015  WBC 4.0 - 10.5 K/uL 12.9(H) 7.4 9.2  Hemoglobin 12.0 - 15.0 g/dL 8.2 Repeated and verified X2.(L) 9.0(L) 9.6(L)  Hematocrit 36.0 - 46.0 % 25.0 Repeated and verified X2.(L) 30.0(L) 29.6(L)  Platelets 150.0 - 400.0 K/uL 398.0 315 284    BMP Latest Ref Rng & Units 03/09/2017 12/28/2016 12/27/2016  Glucose 70 - 99 mg/dL 99 098(J) 191(Y)  BUN 6 - 23 mg/dL 19 78(G) 95(A)  Creatinine 0.40 - 1.20 mg/dL 2.13(Y) 8.65(H) 8.46(N)  Sodium 135 - 145 mEq/L 126(L) 132(L) 131(L)  Potassium 3.5 - 5.1 mEq/L 4.2 4.7 4.6  Chloride 96 - 112 mEq/L 85(L) 91(L) 91(L)  CO2 19 - 32 mEq/L 35(H) 29 27  Calcium 8.4 - 10.5 mg/dL 9.8 9.3 6.2(X)    BNP    Component Value Date/Time   BNP 190.3 (H) 12/27/2016 0924    ProBNP    Component Value Date/Time   PROBNP 51.0 03/09/2017 1645    PFT    Component Value Date/Time   FEV1PRE 1.53 03/17/2017  1510   FEV1POST 1.65 03/17/2017 1510   FVCPRE 1.91 03/17/2017 1510   FVCPOST 2.03 03/17/2017 1510   TLC 4.36 03/17/2017 1510   DLCOUNC 15.33 03/17/2017 1510   PREFEV1FVCRT 80 03/17/2017 1510   PSTFEV1FVCRT 82 03/17/2017 1510    Ct Chest Wo Contrast  Result Date: 03/17/2017 CLINICAL DATA:  Pulmonary hypertension.  Short of breath. EXAM: CT CHEST WITHOUT CONTRAST TECHNIQUE: Multidetector CT imaging of the chest was performed following the standard protocol without IV contrast. COMPARISON:  Chest  radiograph 12/27/2016 FINDINGS: Cardiovascular: No significant vascular findings. Normal heart size. No pericardial effusion. Pulmonary arteries appear normal caliber Mediastinum/Nodes: No axillary supraclavicular adenopathy. No mediastinal hilar adenopathy. No pericardial fluid. Lungs/Pleura: No suspicious pulmonary nodularity. Several small peripheral airspace densities in the posterior RIGHT upper lobe and lower lobe along the fissure (image 36 through 45). Upper Abdomen: Limited view of the liver, kidneys, pancreas are unremarkable. Normal adrenal glands. Musculoskeletal: No aggressive osseous lesion. IMPRESSION: 1. Small region of infection or inflammation in posterior aspect of the RIGHT upper lobe and RIGHT lower lobe along the fissure. 2. No additional acute findings in the lung parenchyma. 3. Pulmonary arteries are within normal limits. Electronically Signed   By: Genevive BiStewart  Edmunds M.D.   On: 03/17/2017 16:47     Past medical hx Past Medical History:  Diagnosis Date  . Carpal tunnel syndrome   . Chronic pain   . Depression   . Diabetes mellitus   . Fibromyalgia   . GERD (gastroesophageal reflux disease)   . Hernia   . Hyperlipemia   . Hypertension   . Miscarriage    twins  . Peripheral neuropathy   . TMJ syndrome      Social History  Substance Use Topics  . Smoking status: Former Smoker    Packs/day: 1.00    Years: 32.00    Types: Cigarettes    Quit date: 11/15/2008  . Smokeless tobacco: Never Used  . Alcohol use No    Tobacco Cessation: Former smoker with a 32-pack-year smoking history who quit in 2010   Past surgical hx, Family hx, Social hx all reviewed.  Current Outpatient Prescriptions on File Prior to Visit  Medication Sig  . Albuterol Sulfate 108 (90 Base) MCG/ACT AEPB Inhale 2 puffs into the lungs every 6 (six) hours as needed (for wheezing).  Marland Kitchen. allopurinol (ZYLOPRIM) 100 MG tablet Take 1 tablet (100 mg total) by mouth 2 (two) times daily. Take 2 tablets by  mouth 2 times daily.  . benazepril-hydrochlorthiazide (LOTENSIN HCT) 20-25 MG per tablet Take 1 tablet by mouth daily.  Marland Kitchen. gabapentin (NEURONTIN) 300 MG capsule Take 3 capsules (900 mg total) by mouth 3 (three) times daily.  Marland Kitchen. levothyroxine (SYNTHROID, LEVOTHROID) 50 MCG tablet Take 50 mcg by mouth daily before breakfast.   . metformin (FORTAMET) 500 MG (OSM) 24 hr tablet Take 500 mg by mouth daily with breakfast.  . omeprazole (PRILOSEC) 40 MG capsule Take 40 mg by mouth daily.  Marland Kitchen. oxycodone (ROXICODONE) 30 MG immediate release tablet Take 30 mg by mouth every 4 (four) hours as needed for pain.  . SUMAtriptan (IMITREX) 100 MG tablet Take 100 mg by mouth every 2 (two) hours as needed for migraine.   . colchicine 0.6 MG tablet Take 1 tablet (0.6 mg total) by mouth 2 (two) times daily. Twice daily until gout flare resolves (Patient not taking: Reported on 04/06/2017)   No current facility-administered medications on file prior to visit.      No  Known Allergies  Review Of Systems:  Constitutional:   No  weight loss, night sweats,  Fevers, chills, fatigue, or  lassitude.  HEENT:   No headaches,  Difficulty swallowing,  Tooth/dental problems, or  Sore throat,                No sneezing, itching, ear ache, nasal congestion, post nasal drip,   CV:  No chest pain,  Orthopnea, PND, swelling in lower extremities, anasarca, dizziness, palpitations, syncope.   GI  No heartburn, indigestion, abdominal pain, nausea, vomiting, diarrhea, change in bowel habits, loss of appetite, bloody stools.   Resp: + shortness of breath with exertion not  at rest.  No excess mucus, no productive cough,  No non-productive cough,  No coughing up of blood.  No change in color of mucus.  No wheezing.  No chest wall deformity  Skin: no rash or lesions.  GU: no dysuria, change in color of urine, no urgency or frequency.  No flank pain, no hematuria   MS:  + joint pain or swelling.  No decreased range of motion.  No back  pain.  Psych:  No change in mood or affect. No depression or anxiety.  No memory loss.   Vital Signs BP 126/68 (BP Location: Left Arm, Cuff Size: Large)   Pulse 81   Ht 5\' 2"  (1.575 m)   Wt 250 lb 9.6 oz (113.7 kg)   SpO2 95%   BMI 45.84 kg/m    Physical Exam:  General- No distress,  A&Ox3, pleasant  ENT: No sinus tenderness, TM clear, pale nasal mucosa, no oral exudate,no post nasal drip, no LAN Cardiac: S1, S2, regular rate and rhythm, no murmur, rub or gallop  Chest: No wheeze/ rales/ dullness; no accessory muscle use, no nasal flaring, no sternal retractions Abd.: Soft Non-tender, nondistended, obese, bowel sounds positive  Ext: No clubbing cyanosis,  1+ bilateral lower extremity edema Neuro:  normal strength, cranial nerves intact  Skin: No rashes, warm and dry Psych: normal mood and behavior   Assessment/Plan  Pulmonary hypertension, low resistance (HCC) Per echo 12/28/2016:The right ventricular systolic pressure was increased consistent   with mild pulmonary hypertension: Pulmonary arteries: PA peak pressure: 37 mm Hg (S). Plan: Continue to monitor for progression.    Dyspnea Dyspnea on exertion Plan We will schedule a follow-up Ct Chest without contrast for 06/13/2017 Continue using your albuterol nebulizer as needed with exertion for shortness of breath. Please continue to work on weight loss Consider increased exercise with a stationary bike or treadmill. Please consider pulmonary rehabilitation. Follow up in 3 months with Dr. Craige Cotta after CT chest. Please contact office for sooner follow up if symptoms do not improve or worsen or seek emergency care   Consider therapeutic trial of low-dose ICS / LABA at follow-up if no better after exercise and weight loss.  At risk for obstructive sleep apnea Split night study has been ordered but not completed. Plan We will call results of split night sleep study 1 study has been completed If results indicate need for  CPAP we will order the appropriate equipment. You  will need follow-up for 1 month after starting therapy, this can be done with the nurse practitioner. Follow up in 3 months with Dr. Craige Cotta after CT chest. Please contact office for sooner follow up if symptoms do not improve or worsen or seek emergency care       Bevelyn Ngo, NP 04/06/2017  8:50 PM

## 2017-04-06 NOTE — Assessment & Plan Note (Signed)
Dyspnea on exertion Plan We will schedule a follow-up Ct Chest without contrast for 06/13/2017 Continue using your albuterol nebulizer as needed with exertion for shortness of breath. Please continue to work on weight loss Consider increased exercise with a stationary bike or treadmill. Please consider pulmonary rehabilitation. Follow up in 3 months with Dr. Craige CottaSood after CT chest. Please contact office for sooner follow up if symptoms do not improve or worsen or seek emergency care   Consider therapeutic trial of low-dose ICS / LABA at follow-up if no better after exercise and weight loss.

## 2017-04-06 NOTE — Patient Instructions (Addendum)
It is nice to meet you today. We will schedule a follow-up Ct Chest without contrast for 06/13/2017 Continue using your albuterol nebulizer as needed with exertion for shortness of breath. Consider increased exercise with a stationary bike or treadmill. Please consider pulmonary rehabilitation. We will call results of split night sleep study 1 study has been completed If results indicate need for CPAP we will order the appropriate equipment. He will need follow-up for 1 month after starting therapy, this can be done with the nurse practitioner. Follow up in 3 months with Dr. Craige CottaSood after CT chest. Please contact office for sooner follow up if symptoms do not improve or worsen or seek emergency care

## 2017-04-06 NOTE — Assessment & Plan Note (Addendum)
Per echo 12/28/2016:The right ventricular systolic pressure was increased consistent   with mild pulmonary hypertension: Pulmonary arteries: PA peak pressure: 37 mm Hg (S). Plan: Continue to monitor for progression.

## 2017-04-06 NOTE — Assessment & Plan Note (Signed)
Split night study has been ordered but not completed. Plan We will call results of split night sleep study 1 study has been completed If results indicate need for CPAP we will order the appropriate equipment. You  will need follow-up for 1 month after starting therapy, this can be done with the nurse practitioner. Follow up in 3 months with Dr. Craige CottaSood after CT chest. Please contact office for sooner follow up if symptoms do not improve or worsen or seek emergency care

## 2017-04-07 NOTE — Progress Notes (Signed)
I have reviewed and agree with assessment/plan.  Coralyn HellingVineet Alice Burnside, MD [facility] 04/07/2017, 6:58 AM Pager:  [phone]

## 2017-04-18 ENCOUNTER — Ambulatory Visit (INDEPENDENT_AMBULATORY_CARE_PROVIDER_SITE_OTHER): Payer: Medicare Other | Admitting: Orthopedic Surgery

## 2017-05-04 ENCOUNTER — Ambulatory Visit (HOSPITAL_BASED_OUTPATIENT_CLINIC_OR_DEPARTMENT_OTHER): Payer: BLUE CROSS/BLUE SHIELD | Attending: Pulmonary Disease | Admitting: Pulmonary Disease

## 2017-05-04 VITALS — Ht 62.0 in | Wt 250.0 lb

## 2017-05-04 DIAGNOSIS — Z6841 Body Mass Index (BMI) 40.0 and over, adult: Secondary | ICD-10-CM | POA: Insufficient documentation

## 2017-05-04 DIAGNOSIS — G4736 Sleep related hypoventilation in conditions classified elsewhere: Secondary | ICD-10-CM | POA: Diagnosis not present

## 2017-05-04 DIAGNOSIS — G4733 Obstructive sleep apnea (adult) (pediatric): Secondary | ICD-10-CM | POA: Diagnosis not present

## 2017-05-04 DIAGNOSIS — E669 Obesity, unspecified: Secondary | ICD-10-CM | POA: Diagnosis not present

## 2017-05-04 DIAGNOSIS — E119 Type 2 diabetes mellitus without complications: Secondary | ICD-10-CM | POA: Diagnosis not present

## 2017-05-04 DIAGNOSIS — I10 Essential (primary) hypertension: Secondary | ICD-10-CM | POA: Insufficient documentation

## 2017-05-04 DIAGNOSIS — I272 Pulmonary hypertension, unspecified: Secondary | ICD-10-CM | POA: Insufficient documentation

## 2017-05-04 DIAGNOSIS — R0609 Other forms of dyspnea: Secondary | ICD-10-CM | POA: Diagnosis present

## 2017-05-04 DIAGNOSIS — G4734 Idiopathic sleep related nonobstructive alveolar hypoventilation: Secondary | ICD-10-CM

## 2017-05-04 DIAGNOSIS — R0902 Hypoxemia: Secondary | ICD-10-CM

## 2017-05-09 ENCOUNTER — Encounter (INDEPENDENT_AMBULATORY_CARE_PROVIDER_SITE_OTHER): Payer: Self-pay | Admitting: Orthopedic Surgery

## 2017-05-09 ENCOUNTER — Ambulatory Visit (INDEPENDENT_AMBULATORY_CARE_PROVIDER_SITE_OTHER): Payer: BLUE CROSS/BLUE SHIELD | Admitting: Orthopedic Surgery

## 2017-05-09 ENCOUNTER — Ambulatory Visit (INDEPENDENT_AMBULATORY_CARE_PROVIDER_SITE_OTHER): Payer: BLUE CROSS/BLUE SHIELD

## 2017-05-09 VITALS — Ht 62.0 in | Wt 250.0 lb

## 2017-05-09 DIAGNOSIS — I87323 Chronic venous hypertension (idiopathic) with inflammation of bilateral lower extremity: Secondary | ICD-10-CM

## 2017-05-09 DIAGNOSIS — L97421 Non-pressure chronic ulcer of left heel and midfoot limited to breakdown of skin: Secondary | ICD-10-CM | POA: Diagnosis not present

## 2017-05-09 DIAGNOSIS — M25562 Pain in left knee: Secondary | ICD-10-CM

## 2017-05-09 DIAGNOSIS — M1712 Unilateral primary osteoarthritis, left knee: Secondary | ICD-10-CM

## 2017-05-09 MED ORDER — METHYLPREDNISOLONE ACETATE 40 MG/ML IJ SUSP
40.0000 mg | INTRAMUSCULAR | Status: AC | PRN
  Administered 2017-05-09: 40 mg via INTRA_ARTICULAR

## 2017-05-09 MED ORDER — LIDOCAINE HCL 1 % IJ SOLN
5.0000 mL | INTRAMUSCULAR | Status: AC | PRN
  Administered 2017-05-09: 5 mL

## 2017-05-09 NOTE — Progress Notes (Signed)
Office Visit Note   Patient: Erin [name]           Date of Birth: 06/10/1956           MRN: [id number] Visit Date: 05/09/2017              Requested by: Samuel JesterButler, Cynthia, DO [address] [place], [place] [address] PCP: Samuel JesterButler, Cynthia, DO  Chief Complaint  Patient presents with  . Left Knee - Pain  . Left Foot - Follow-up  . Right Foot - Follow-up      HPI:  Patient is a 61 year old woman with venous stasis insufficiency bilateral lower extremities insensate neuropathic ulcers left heel and lateral aspect of the right foot with acute left knee pain. Patient states that she does wear compression stockings for venous insufficiency but her husband states that she does not wear them. Patient complains of acute pain in the left knee about a week ago she just woke up and was unable to bare weight with excruciating pain with activities of daily living.  Assessment & Plan: Visit Diagnoses:  1. Acute pain of left knee   2. Unilateral primary osteoarthritis, left knee     Plan:   Ulcers both feet debrided of skin and soft tissue 2 left knee injected, importance of wearing compression stockings was discussed.  Follow-Up Instructions: Return in about 4 weeks (around 06/06/2017).   Ortho Exam  Patient is alert, oriented, no adenopathy, well-dressed, normal affect, normal respiratory effort.  examination patient has an antalgic gait. She has significant pitting edema both lower extremities with venous insufficiency brawny sklor changes without open ulcers. She has to insensate neuropathic ulcers 1 in the left heel the other on the lateral aspect of the  Right fifth metatarsal head. After informed consent a 10 blade knife was used to debride the skin and soft tissue from both ulcers silver nitrate was used for hemostasis Band-Aids were applied. The left heel ulcer is 3 x 4 cm and 3 mm deep the lateral right foot ulcer is 10 mm in diameter 1 mm deep. There is healthy granulation tissue at  the base of both wounds no purulence no abscess the wounds did not probe the bone.  She has a mild effusion.  Imaging: Xr Knee 1-2 Views Left  Result Date: 05/09/2017  Two-view radiographs of the left knee shows periarticular bony spurs in all 3 compartments there is joint space narrowing medially. No subcondylar cysts she does have subcondylar sclerosis.   Labs: Lab Results  Component Value Date   HGBA1C 6.1 (H) 12/28/2016   HGBA1C 6.0 (H) 07/04/2015   HGBA1C 5.6 12/10/2014   ESRSEDRATE 90 (H) 07/05/2015   ESRSEDRATE 45 (H) 12/10/2014   LABURIC 8.4 (H) 02/16/2017   LABURIC 6.5 11/23/2016   LABURIC 8.3 (H) 11/02/2016    Orders:  Orders Placed This Encounter  Procedures  . XR Knee 1-2 Views Left   No orders of the defined types were placed in this encounter.    Procedures: Large Joint Inj Date/Time: 05/09/2017 4:28 PM Performed by: Jakoby Melendrez V Authorized by: Nadara MustardUDA, Deylan Canterbury V   Consent Given by:  Patient Site marked: the procedure site was marked   Timeout: prior to procedure the correct patient, procedure, and site was verified   Indications:  Pain and diagnostic evaluation Location:  Knee Site:  L knee Prep: patient was prepped and draped in usual sterile fashion   Needle Size:  22 G Needle Length:  1.5  inches Approach:  Anteromedial Ultrasound Guidance: No   Fluoroscopic Guidance: No   Arthrogram: No   Medications:  5 mL lidocaine 1 %; 40 mg methylPREDNISolone acetate 40 MG/ML Aspiration Attempted: No   Patient tolerance:  Patient tolerated the procedure well with no immediate complications    Clinical Data: No additional findings.  ROS:  All other systems negative, except as noted in the HPI. Review of Systems  Objective: Vital Signs: Ht 5\' 2"  (1.575 m)   Wt 250 lb (113.4 kg)   BMI 45.73 kg/m   Specialty Comments:  No specialty comments available.  PMFS History: Patient Active Problem List   Diagnosis Date Noted  . Pulmonary hypertension,  low resistance (HCC) 04/06/2017  . At risk for obstructive sleep apnea 04/06/2017  . Ulcer of left heel, limited to breakdown of skin (HCC) 03/21/2017  . Idiopathic chronic venous hypertension of both lower extremities with inflammation 03/21/2017  . 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 Influenza with respiratory manifestation 12/28/2016  . Acute kidney injury (HCC) 12/28/2016  . CKD (chronic kidney disease) stage 3, GFR 30-59 ml/min 12/28/2016  . Hypoxia 12/27/2016  . Idiopathic chronic gout of right ankle without tophus 11/23/2016  . Pain in right foot 11/23/2016  . 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 Essential hypertension 07/06/2015  . Fibromyalgia 07/06/2015  . Dyspnea 07/04/2015  . Diabetic neuropathy (HCC) 12/10/2014   Past Medical History:  Diagnosis Date  . Carpal tunnel syndrome   . Chronic pain   . Depression   . Diabetes mellitus   . Fibromyalgia   . GERD (gastroesophageal reflux disease)   . Hernia   . Hyperlipemia   . Hypertension   . Miscarriage    twins  . Peripheral neuropathy   . TMJ syndrome     Family History  Problem Relation Age of Onset  . Aneurysm Father        Deceased, 33  . CAD Father   . Heart disease Father   . Cancer Father   . Pancreatic cancer Mother        Deceased, 21    Past Surgical History:  Procedure Laterality Date  . ABDOMINAL SURGERY    . PARTIAL HYSTERECTOMY  1983  . TONSILLECTOMY    . TUBAL LIGATION  1983  . VAGINAL HYSTERECTOMY  2002   Social History   Occupational History  . disabled    Social History Main Topics  . Smoking status: Former Smoker    Packs/day: 1.00    Years: 32.00    Types: Cigarettes    Quit date: 11/15/2008  . Smokeless tobacco: Never Used  . Alcohol use No  . Drug use: No  . Sexual activity: Not on file

## 2017-05-16 ENCOUNTER — Telehealth (INDEPENDENT_AMBULATORY_CARE_PROVIDER_SITE_OTHER): Payer: Self-pay | Admitting: Orthopedic Surgery

## 2017-05-16 NOTE — Telephone Encounter (Signed)
I called and spoke with patient this afternoon. She states she was seen last Monday 05/09/17 and had a cortisone shot in left knee. Your office notes says xrays show  periarticular bony spurs in all 3 compartments there is joint space narrowing medially. No subcondylar cysts she does have subcondylar sclerosis. She is not doing any better. She has not been able to walk for two weeks. Patient is concerned still in severe pain. Offered office visit. She wanted to know if she would be a candidate for hyaluronic acid injections, this was mentioned from a friend. Please advise.

## 2017-05-16 NOTE — Telephone Encounter (Signed)
PT REQUESTED A CALL BACK AT YOUR EARLIEST CONVENIENCE.    [phone]

## 2017-05-16 NOTE — Telephone Encounter (Signed)
Call patient and we can try a hyaluronic acid injection please obtain insurance authorization for this we can do the 1 shot injection.

## 2017-05-17 NOTE — Telephone Encounter (Signed)
I called advised patient Dr. Lajoyce Cornersuda does believe she is a candidate for gel injection. Applied for synvisc and she was approved. Scheduled for appointment on Thursday.

## 2017-05-19 ENCOUNTER — Ambulatory Visit (INDEPENDENT_AMBULATORY_CARE_PROVIDER_SITE_OTHER): Payer: BLUE CROSS/BLUE SHIELD | Admitting: Orthopedic Surgery

## 2017-05-19 DIAGNOSIS — M1712 Unilateral primary osteoarthritis, left knee: Secondary | ICD-10-CM | POA: Diagnosis not present

## 2017-05-19 MED ORDER — LIDOCAINE HCL 1 % IJ SOLN
1.0000 mL | INTRAMUSCULAR | Status: AC | PRN
  Administered 2017-05-19: 1 mL

## 2017-05-19 MED ORDER — HYLAN G-F 20 48 MG/6ML IX SOSY
48.0000 mg | PREFILLED_SYRINGE | INTRA_ARTICULAR | Status: AC | PRN
  Administered 2017-05-19: 48 mg via INTRA_ARTICULAR

## 2017-05-19 NOTE — Progress Notes (Signed)
Office Visit Note   Patient: Erin [name]           Date of Birth: 1956/01/11           MRN: [id number] Visit Date: 05/19/2017              Requested by: Samuel Jester, DO [address] [place], [place] [address] PCP: Samuel Jester, DO  No chief complaint on file.     HPI:  Patient is a 61 year old woman with venous stasis insufficiency bilateral lower extremitieswith left knee pain. Left knee pain. Pain with ambulation and activities of daily living. Complains of start up pain there is associated swelling. Is here today for Synvisc one injection  Assessment & Plan: Visit Diagnoses:  1. Primary osteoarthritis of left knee     Plan:   Ulcers both feet debrided of skin and soft tissue 2 left knee injected, importance of wearing compression stockings was discussed.  Follow-Up Instructions: Return if symptoms worsen or fail to improve.   Right Knee Exam   Tenderness  The patient is experiencing tenderness in the medial joint line.  Range of Motion  The patient has normal right knee ROM.  Tests  Varus: negative Valgus: negative  Other  Erythema: absent Other tests: no effusion present      Patient is alert, oriented, no adenopathy, well-dressed, normal affect, normal respiratory effort.  examination patient has an antalgic gait.   Imaging: No results found.  Labs: Lab Results  Component Value Date   HGBA1C 6.1 (H) 12/28/2016   HGBA1C 6.0 (H) 07/04/2015   HGBA1C 5.6 12/10/2014   ESRSEDRATE 90 (H) 07/05/2015   ESRSEDRATE 45 (H) 12/10/2014   LABURIC 8.4 (H) 02/16/2017   LABURIC 6.5 11/23/2016   LABURIC 8.3 (H) 11/02/2016    Orders:  No orders of the defined types were placed in this encounter.  No orders of the defined types were placed in this encounter.    Procedures: Large Joint Inj Date/Time: 05/19/2017 3:54 PM Performed by: Adonis Huguenin Authorized by: Barnie Del R   Consent Given by:  Patient Site marked: the procedure site was  marked   Timeout: prior to procedure the correct patient, procedure, and site was verified   Indications:  Pain and diagnostic evaluation Location:  Knee Site:  L knee Needle Size:  22 G Needle Length:  1.5 inches Ultrasound Guidance: No   Fluoroscopic Guidance: No   Arthrogram: No   Medications:  1 mL lidocaine 1 %; 48 mg Hylan 48 MG/6ML Aspiration Attempted: No   Patient tolerance:  Patient tolerated the procedure well with no immediate complications    Clinical Data: No additional findings.  ROS:  All other systems negative, except as noted in the HPI. Review of Systems  Constitutional: Negative for chills and fever.  Musculoskeletal: Positive for arthralgias.    Objective: Vital Signs: There were no vitals taken for this visit.  Specialty Comments:  No specialty comments available.  PMFS History: Patient Active Problem List   Diagnosis Date Noted  . Unilateral primary osteoarthritis, left knee 05/09/2017  . Pulmonary hypertension, low resistance (HCC) 04/06/2017  . At risk for obstructive sleep apnea 04/06/2017  . Ulcer of left heel, limited to breakdown of skin (HCC) 03/21/2017  . Idiopathic chronic venous hypertension of both lower extremities with inflammation 03/21/2017  . 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 Influenza with respiratory manifestation 12/28/2016  . Acute kidney injury (HCC) 12/28/2016  .  CKD (chronic kidney disease) stage 3, GFR 30-59 ml/min 12/28/2016  . Hypoxia 12/27/2016  . Idiopathic chronic gout of right ankle without tophus 11/23/2016  . Pain in right foot 11/23/2016  . 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 Essential hypertension 07/06/2015  . Fibromyalgia 07/06/2015  . Dyspnea 07/04/2015  . Diabetic neuropathy (HCC) 12/10/2014   Past Medical History:  Diagnosis Date  . Carpal tunnel syndrome   . Chronic pain   . Depression   . Diabetes mellitus   .  Fibromyalgia   . GERD (gastroesophageal reflux disease)   . Hernia   . Hyperlipemia   . Hypertension   . Miscarriage    twins  . Peripheral neuropathy   . TMJ syndrome     Family History  Problem Relation Age of Onset  . Aneurysm Father        Deceased, 4382  . CAD Father   . Heart disease Father   . Cancer Father   . Pancreatic cancer Mother        Deceased, 4467    Past Surgical History:  Procedure Laterality Date  . ABDOMINAL SURGERY    . PARTIAL HYSTERECTOMY  1983  . TONSILLECTOMY    . TUBAL LIGATION  1983  . VAGINAL HYSTERECTOMY  2002   Social History   Occupational History  . disabled    Social History Main Topics  . Smoking status: Former Smoker    Packs/day: 1.00    Years: 32.00    Types: Cigarettes    Quit date: 11/15/2008  . Smokeless tobacco: Never Used  . Alcohol use No  . Drug use: No  . Sexual activity: Not on file

## 2017-05-24 ENCOUNTER — Telehealth: Payer: Self-pay | Admitting: Pulmonary Disease

## 2017-05-24 DIAGNOSIS — G4733 Obstructive sleep apnea (adult) (pediatric): Secondary | ICD-10-CM | POA: Diagnosis not present

## 2017-05-24 DIAGNOSIS — G4734 Idiopathic sleep related nonobstructive alveolar hypoventilation: Secondary | ICD-10-CM | POA: Insufficient documentation

## 2017-05-24 NOTE — Procedures (Signed)
   Patient Name: Erin [name], Erin [name] Date: 05/04/2017 Gender: Female D.O.B: Nov 03, 1956 Age (years): [age] Referring Provider: Coralyn HellingVineet Brevyn Ring MD, ABSM Height (inches): 62 Interpreting Physician: Coralyn HellingVineet Breah Joa MD, ABSM Weight (lbs): 250 RPSGT: Shelah LewandowskyGregory, Kenyon BMI: 46 MRN: [id number] Neck Size: 19.00  CLINICAL INFORMATION Sleep [name] Type: NPSG  Indication for sleep [name]: Diabetes, Hypertension, Obesity  Epworth Sleepiness Score: 4  SLEEP [name] TECHNIQUE As per the AASM Manual for the Scoring of Sleep and Associated Events v2.3 (April 2016) with a hypopnea requiring 4% desaturations.  The channels recorded and monitored were frontal, central and occipital EEG, electrooculogram (EOG), submentalis EMG (chin), nasal and oral airflow, thoracic and abdominal wall motion, anterior tibialis EMG, snore microphone, electrocardiogram, and pulse oximetry.  MEDICATIONS Medications self-administered by patient taken the night of the [name] : ROXICODONE  SLEEP ARCHITECTURE The [name] was initiated at 9:43:35 PM and ended at 5:14:51 AM.  Sleep onset time was 0.7 minutes and the sleep efficiency was 83.4%. The total sleep time was 376.6 minutes.  Stage REM latency was 347.0 minutes.  The patient spent 9.03% of the night in stage N1 sleep, 76.50% in stage N2 sleep, 0.00% in stage N3 and 14.47% in REM.  Alpha intrusion was absent.  Supine sleep was 98.54%.  RESPIRATORY PARAMETERS The overall apnea/hypopnea index (AHI) was 5.9 per hour. There were 35 total apneas, including 30 obstructive, 5 central and 0 mixed apneas. There were 2 hypopneas and 0 RERAs.  The AHI during Stage REM sleep was 9.9 per hour.  AHI while supine was 5.8 per hour.  The mean oxygen saturation was 95.23%. The minimum SpO2 during sleep was 71.00%.  Moderate snoring was noted during this [name].  CARDIAC DATA The 2 lead EKG demonstrated sinus rhythm. The mean heart rate was 64.92 beats per minute. Other EKG findings  include: None.  LEG MOVEMENT DATA The total PLMS were 12 with a resulting PLMS index of 1.91. Associated arousal with leg movement index was 0.2 .  IMPRESSIONS - Mild obstructive sleep apnea occurred during this [name] (AHI = 5.9/h and SaO2 low 71%).  DIAGNOSIS - Obstructive Sleep Apnea (327.23 [G47.33 ICD-10]) - Nocturnal Hypoxemia (327.26 [G47.36 ICD-10])  RECOMMENDATIONS - Given the degree of her oxygen desaturation in addition to having sleep apnea, she should be referred back to the sleep lab for a CPAP titration [name].  [Electronically signed] 05/24/2017 04:12 PM  Coralyn HellingVineet Read Bonelli MD, ABSM Diplomate, American Board of Sleep Medicine   NPI: [id number]

## 2017-05-24 NOTE — Telephone Encounter (Signed)
PSG 05/04/17 >> AHI 5.9, SpO2 low 71%.     Will have my nurse inform pt that sleep study shows mild sleep apnea, but low oxygen while asleep also.  Options are 1) in lab CPAP titration, 2) ROV first.  If She is agreeable to in lab CPAP titration, then please place order.  Will call to schedule ROV after this is reviewed.

## 2017-05-30 NOTE — Telephone Encounter (Signed)
Noted  

## 2017-05-30 NOTE — Telephone Encounter (Signed)
Results have been explained to patient, pt expressed understanding.  Pt wants to wait for OV in September to discuss results with VS and schedule the CPAP Titration.  Nothing further needed.

## 2017-06-06 ENCOUNTER — Inpatient Hospital Stay: Admission: RE | Admit: 2017-06-06 | Payer: BLUE CROSS/BLUE SHIELD | Source: Ambulatory Visit

## 2017-06-07 ENCOUNTER — Ambulatory Visit (INDEPENDENT_AMBULATORY_CARE_PROVIDER_SITE_OTHER): Payer: BLUE CROSS/BLUE SHIELD | Admitting: Orthopedic Surgery

## 2017-06-10 ENCOUNTER — Ambulatory Visit (INDEPENDENT_AMBULATORY_CARE_PROVIDER_SITE_OTHER): Payer: BLUE CROSS/BLUE SHIELD | Admitting: Family

## 2017-06-10 DIAGNOSIS — R0602 Shortness of breath: Secondary | ICD-10-CM | POA: Diagnosis not present

## 2017-06-10 DIAGNOSIS — I87321 Chronic venous hypertension (idiopathic) with inflammation of right lower extremity: Secondary | ICD-10-CM

## 2017-06-10 DIAGNOSIS — I87322 Chronic venous hypertension (idiopathic) with inflammation of left lower extremity: Secondary | ICD-10-CM

## 2017-06-10 DIAGNOSIS — L97421 Non-pressure chronic ulcer of left heel and midfoot limited to breakdown of skin: Secondary | ICD-10-CM | POA: Diagnosis not present

## 2017-06-10 DIAGNOSIS — I87323 Chronic venous hypertension (idiopathic) with inflammation of bilateral lower extremity: Secondary | ICD-10-CM

## 2017-06-13 ENCOUNTER — Other Ambulatory Visit: Payer: BLUE CROSS/BLUE SHIELD

## 2017-06-13 ENCOUNTER — Ambulatory Visit (INDEPENDENT_AMBULATORY_CARE_PROVIDER_SITE_OTHER)
Admission: RE | Admit: 2017-06-13 | Discharge: 2017-06-13 | Disposition: A | Discharge status: Home / Self Care | Payer: BLUE CROSS/BLUE SHIELD | Source: Ambulatory Visit | Attending: Acute Care | Admitting: Acute Care

## 2017-06-13 ENCOUNTER — Other Ambulatory Visit: Payer: Self-pay | Admitting: Acute Care

## 2017-06-13 DIAGNOSIS — R0602 Shortness of breath: Secondary | ICD-10-CM

## 2017-06-15 ENCOUNTER — Ambulatory Visit (INDEPENDENT_AMBULATORY_CARE_PROVIDER_SITE_OTHER): Payer: BLUE CROSS/BLUE SHIELD | Admitting: Orthopedic Surgery

## 2017-06-17 ENCOUNTER — Ambulatory Visit (INDEPENDENT_AMBULATORY_CARE_PROVIDER_SITE_OTHER): Payer: BLUE CROSS/BLUE SHIELD | Admitting: Family

## 2017-06-17 ENCOUNTER — Encounter (INDEPENDENT_AMBULATORY_CARE_PROVIDER_SITE_OTHER): Payer: Self-pay | Admitting: Family

## 2017-06-17 ENCOUNTER — Ambulatory Visit (INDEPENDENT_AMBULATORY_CARE_PROVIDER_SITE_OTHER): Payer: Self-pay

## 2017-06-17 VITALS — Ht 62.0 in | Wt 250.0 lb

## 2017-06-17 DIAGNOSIS — I87323 Chronic venous hypertension (idiopathic) with inflammation of bilateral lower extremity: Secondary | ICD-10-CM

## 2017-06-17 DIAGNOSIS — L97413 Non-pressure chronic ulcer of right heel and midfoot with necrosis of muscle: Secondary | ICD-10-CM

## 2017-06-17 NOTE — Progress Notes (Signed)
Office Visit Note   Patient: Erin [name]           Date of Birth: 10-14-1956           MRN: [id number] Visit Date: 06/10/2017              Requested by: Bevelyn NgoGroce, Sarah F, NP [address] [place], [place] [id number] PCP: Samuel JesterButler, Cynthia, DO  No chief complaint on file.     HPI: Patient is a 61 year old woman with venous stasis insufficiency bilateral lower extremities, insensate neuropathic ulcers left heel and lateral aspect of the right foot. Patient states that she does wear compression stockings for venous insufficiency but her husband states that she does not wear them consistently. Has been applying Eucerin to heels daily. Noticed worsenting of the right heel ulcer earlier this week.    Assessment & Plan: Visit Diagnoses:  1. Idiopathic chronic venous hypertension of both lower extremities with inflammation   2. Ulcer of left heel, limited to breakdown of skin (HCC)   3. SOB (shortness of breath)     Plan:   Ulcers both feet debrided of skin and soft tissue 2, importance of wearing compression stockings was discussed.  Follow-Up Instructions: No Follow-up on file.   Ortho Exam  Patient is alert, oriented, no adenopathy, well-dressed, normal affect, normal respiratory effort.  examination patient has an antalgic gait. She has significant pitting edema both lower extremities with venous insufficiency brawny sklor changes without open ulcers. She has to insensate neuropathic ulcers x 1 in the left heel. After informed consent a 10 blade knife was used to debride the skin and soft tissue from both ulcers silver nitrate was used for hemostasis Band-Aids were applied. The left heel ulcer is 3 x 4 cm and 2 mm deep. There is healthy granulation tissue at the base of both wounds no purulence no abscess the wounds did not probe the bone.  Imaging: No results found.  Labs: Lab Results  Component Value Date   HGBA1C 6.1 (H) 12/28/2016   HGBA1C 6.0 (H) 07/04/2015   HGBA1C 5.6 12/10/2014   ESRSEDRATE 90 (H) 07/05/2015   ESRSEDRATE 45 (H) 12/10/2014   LABURIC 8.4 (H) 02/16/2017   LABURIC 6.5 11/23/2016   LABURIC 8.3 (H) 11/02/2016    Orders:  No orders of the defined types were placed in this encounter.  No orders of the defined types were placed in this encounter.    Procedures: No procedures performed  Clinical Data: No additional findings.  ROS:  All other systems negative, except as noted in the HPI. Review of Systems  Constitutional: Negative for chills and fever.  Cardiovascular: Positive for leg swelling.  Skin: Positive for color change and wound.    Objective: Vital Signs: There were no vitals taken for this visit.  Specialty Comments:  No specialty comments available.  PMFS History: Patient Active Problem List   Diagnosis Date Noted  . OSA (obstructive sleep apnea) 05/24/2017  . Sleep related hypoxia 05/24/2017  . Unilateral primary osteoarthritis, left knee 05/09/2017  . Pulmonary hypertension, low resistance (HCC) 04/06/2017  . At risk for obstructive sleep apnea 04/06/2017  . Ulcer of left heel, limited to breakdown of skin (HCC) 03/21/2017  . Idiopathic chronic venous hypertension of both lower extremities with inflammation 03/21/2017  . 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 Influenza with respiratory manifestation 12/28/2016  . Acute kidney injury (HCC) 12/28/2016  . CKD (chronic kidney disease) stage 3,  GFR 30-59 ml/min 12/28/2016  . Hypoxia 12/27/2016  . Idiopathic chronic gout of right ankle without tophus 11/23/2016  . Pain in right foot 11/23/2016  . 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 Essential hypertension 07/06/2015  . Fibromyalgia 07/06/2015  . Dyspnea 07/04/2015  . Diabetic neuropathy (HCC) 12/10/2014   Past Medical History:  Diagnosis Date  . Carpal tunnel syndrome   . Chronic pain   . Depression   . Diabetes  mellitus   . Fibromyalgia   . GERD (gastroesophageal reflux disease)   . Hernia   . Hyperlipemia   . Hypertension   . Miscarriage    twins  . Peripheral neuropathy   . TMJ syndrome     Family History  Problem Relation Age of Onset  . Aneurysm Father        Deceased, 5782  . CAD Father   . Heart disease Father   . Cancer Father   . Pancreatic cancer Mother        Deceased, 6267    Past Surgical History:  Procedure Laterality Date  . ABDOMINAL SURGERY    . PARTIAL HYSTERECTOMY  1983  . TONSILLECTOMY    . TUBAL LIGATION  1983  . VAGINAL HYSTERECTOMY  2002   Social History   Occupational History  . disabled    Social History Main Topics  . Smoking status: Former Smoker    Packs/day: 1.00    Years: 32.00    Types: Cigarettes    Quit date: 11/15/2008  . Smokeless tobacco: Never Used  . Alcohol use No  . Drug use: No  . Sexual activity: Not on file

## 2017-06-20 NOTE — Progress Notes (Signed)
Office Visit Note   Patient: Erin [name]           Date of Birth: 10-31-1956           MRN: [id number] Visit Date: 06/17/2017              Requested by: Samuel Jester, DO [address] [place], [place] [address] PCP: Samuel Jester, DO  Chief Complaint  Patient presents with  . Left Leg - Follow-up    BLE VSI  . Right Leg - Follow-up      HPI: Patient is a 61 year old woman with venous stasis insufficiency bilateral lower extremities, insensate neuropathic ulcers left heel and lateral aspect of the right foot. Has been in unna and dynaflex wraps for last week. Pleased with improvement in swelling and ulceration on left.   Assessment & Plan: Visit Diagnoses:  1. Ulcer of right heel, with necrosis of muscle (HCC)   2. Idiopathic chronic venous hypertension of both lower extremities with inflammation     Plan:   Ulcer right foot debrided of skin and soft tissue. importance of wearing compression stockings was discussed. Will resume Vive on left. Unna and dynaflex to right. Silver cell over ulcer.  Follow-Up Instructions: Return in about 2 weeks (around 07/01/2017).   Ortho Exam  Patient is alert, oriented, no adenopathy, well-dressed, normal affect, normal respiratory effort. Examination patient has an antalgic gait. She has great wrinkling of skin, worse edema on right. The ulceration to her left heel is well healed. The right heel ulcer is 3 cm in diameter and probes 6 mm deep, granulation tissue in wound bed. Serosanguinous drainage. surrounding callus pared with a 10 blade. There is healthy granulation tissue at the base of both wounds no purulence no abscess the wounds did not probe the bone.  Imaging: No results found.  Labs: Lab Results  Component Value Date   HGBA1C 6.1 (H) 12/28/2016   HGBA1C 6.0 (H) 07/04/2015   HGBA1C 5.6 12/10/2014   ESRSEDRATE 90 (H) 07/05/2015   ESRSEDRATE 45 (H) 12/10/2014   LABURIC 8.4 (H) 02/16/2017   LABURIC 6.5 11/23/2016   LABURIC 8.3 (H) 11/02/2016    Orders:  Orders Placed This Encounter  Procedures  . XR Os Calcis Right   No orders of the defined types were placed in this encounter.    Procedures: No procedures performed  Clinical Data: No additional findings.  ROS:  All other systems negative, except as noted in the HPI. Review of Systems  Constitutional: Negative for chills and fever.  Cardiovascular: Positive for leg swelling.  Skin: Positive for color change and wound.    Objective: Vital Signs: Ht 5\' 2"  (1.575 m)   Wt 250 lb (113.4 kg)   BMI 45.73 kg/m   Specialty Comments:  No specialty comments available.  PMFS History: Patient Active Problem List   Diagnosis Date Noted  . OSA (obstructive sleep apnea) 05/24/2017  . Sleep related hypoxia 05/24/2017  . Unilateral primary osteoarthritis, left knee 05/09/2017  . Pulmonary hypertension, low resistance (HCC) 04/06/2017  . At risk for obstructive sleep apnea 04/06/2017  . Ulcer of left heel, limited to breakdown of skin (HCC) 03/21/2017  . Idiopathic chronic venous hypertension of both lower extremities with inflammation 03/21/2017  . 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 Influenza with respiratory manifestation 12/28/2016  . Acute kidney injury (HCC) 12/28/2016  . CKD (chronic kidney disease) stage 3, GFR 30-59 ml/min 12/28/2016  . Hypoxia  12/27/2016  . Idiopathic chronic gout of right ankle without tophus 11/23/2016  . Pain in right foot 11/23/2016  . 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 Essential hypertension 07/06/2015  . Fibromyalgia 07/06/2015  . Dyspnea 07/04/2015  . Diabetic neuropathy (HCC) 12/10/2014   Past Medical History:  Diagnosis Date  . Carpal tunnel syndrome   . Chronic pain   . Depression   . Diabetes mellitus   . Fibromyalgia   . GERD (gastroesophageal reflux disease)   . Hernia   . Hyperlipemia   . Hypertension   .  Miscarriage    twins  . Peripheral neuropathy   . TMJ syndrome     Family History  Problem Relation Age of Onset  . Aneurysm Father        Deceased, 3482  . CAD Father   . Heart disease Father   . Cancer Father   . Pancreatic cancer Mother        Deceased, 8567    Past Surgical History:  Procedure Laterality Date  . ABDOMINAL SURGERY    . PARTIAL HYSTERECTOMY  1983  . TONSILLECTOMY    . TUBAL LIGATION  1983  . VAGINAL HYSTERECTOMY  2002   Social History   Occupational History  . disabled    Social History Main Topics  . Smoking status: Former Smoker    Packs/day: 1.00    Years: 32.00    Types: Cigarettes    Quit date: 11/15/2008  . Smokeless tobacco: Never Used  . Alcohol use No  . Drug use: No  . Sexual activity: Not on file

## 2017-06-24 ENCOUNTER — Encounter (INDEPENDENT_AMBULATORY_CARE_PROVIDER_SITE_OTHER): Payer: Self-pay | Admitting: Family

## 2017-06-24 ENCOUNTER — Ambulatory Visit (INDEPENDENT_AMBULATORY_CARE_PROVIDER_SITE_OTHER): Payer: BLUE CROSS/BLUE SHIELD | Admitting: Family

## 2017-06-24 VITALS — Ht 62.0 in | Wt 250.0 lb

## 2017-06-24 DIAGNOSIS — I87323 Chronic venous hypertension (idiopathic) with inflammation of bilateral lower extremity: Secondary | ICD-10-CM

## 2017-06-24 DIAGNOSIS — L97412 Non-pressure chronic ulcer of right heel and midfoot with fat layer exposed: Secondary | ICD-10-CM | POA: Diagnosis not present

## 2017-06-29 ENCOUNTER — Ambulatory Visit (INDEPENDENT_AMBULATORY_CARE_PROVIDER_SITE_OTHER): Payer: BLUE CROSS/BLUE SHIELD | Admitting: Acute Care

## 2017-06-29 ENCOUNTER — Encounter: Payer: Self-pay | Admitting: Acute Care

## 2017-06-29 VITALS — BP 126/70 | HR 79 | Ht 63.0 in | Wt 247.0 lb

## 2017-06-29 DIAGNOSIS — G4733 Obstructive sleep apnea (adult) (pediatric): Secondary | ICD-10-CM | POA: Diagnosis not present

## 2017-06-29 DIAGNOSIS — R911 Solitary pulmonary nodule: Secondary | ICD-10-CM

## 2017-06-29 DIAGNOSIS — G4734 Idiopathic sleep related nonobstructive alveolar hypoventilation: Secondary | ICD-10-CM | POA: Diagnosis not present

## 2017-06-29 NOTE — Assessment & Plan Note (Signed)
Overnight sleep study 05/04/2017: Diagnosis :Mild obstructive sleep apnea AHI equals 5.9 per hour and oxygen saturation low 71% Recommendation: given degree of oxygen desaturation in addition to sleep apnea, she should be referred back to the sleep lab for CPAP titration study. Patient is currently overwhelmed with current medical management to save her right foot. Discussed life shortening consequences of untreated OSA Patient verbalized understanding, but continues to wish to wait until October to address OSA Plan We will place an order for CPAP titration in October ( If you feel you can do this sooner please call us) We will order nocturnal  oxygen . Wear this at 2 L [place] at bedtime. Continue using your Albuterol rescue inhaler for your shortness of breath. Follow up CT scan in 09/2017 for follow up of pulmonary nodule. Follow up appointment with Dr. Craige CottaSood Sept. 25th as is already scheduled. Confirm CPAP titration for October at this visit. Please contact office for sooner follow up if symptoms do not improve or worsen or seek emergency care

## 2017-06-29 NOTE — Progress Notes (Signed)
History of Present Illness Erin [name] is a 61 y.o. female former smoker ( 32 pack year smoking history, quit 2010)) with with dyspnea on exertion. She is followed by Dr. Craige CottaSood   06/29/2017 OV to discuss sleep study results: Pt. Presents for follow up of sleep study.We discussed the results of her sleep study.results indicate obstructive sleep apnea, and nocturnal hypoxemia. We discussed that she needs to treat her sleep apnea with CPAP. Per recommendation of Dr. Craige CottaSood, who read the study, she should have a CPAP titration to determine best settings of CPAP for optimal treatment. Patient appeared overwhelmed at discussion. She is trying to save her right foot from amputation, and she does not want to start her CPAP at present.We discussed the life shortening consequences of untreated sleep apnea, which include high blood pressure, heart disease, stroke disease, diabetes, depression, and pulmonary hypertension. She is aware of these risks, and still wants to wait until October to initiate treatment of her OSA and nocturnal hypoxemia. Patient states she does have daytime sleepiness, difficulty focusing, and occasionally awakens with a headache. She states she does have dyspnea on exertion. I suspect this may be due to body habitus and deconditioning. She is unable to exercise due to the issue she is having with her feet. She denies chest pain, fever, orthopnea, or hemoptysis.   Test Results: CT chest without contrast 06/13/2017>>Stable appearance of clustered nodules within the right lung which are likely the sequelae of inflammatory or infectious bronchiolitis. New part solid nodules identified within the right upper lobe. Nonspecific. Follow-up non-contrast CT recommended at 3-6 months to confirm persistence.   PFT 03/17/17 >> FEV1 1.65 (70%), FEV1% 82, TLC 4.36 (93%), DLCO 74%, no BD>> nonspecific could be related to body habitus CT chest 03/17/17 >> small peripheral densities in RUL and  RLL  CBC Latest Ref Rng & Units 03/09/2017 12/27/2016 07/05/2015  WBC 4.0 - 10.5 K/uL 12.9(H) 7.4 9.2  Hemoglobin 12.0 - 15.0 g/dL 8.2 Repeated and verified X2.(L) 9.0(L) 9.6(L)  Hematocrit 36.0 - 46.0 % 25.0 Repeated and verified X2.(L) 30.0(L) 29.6(L)  Platelets 150.0 - 400.0 K/uL 398.0 315 284    BMP Latest Ref Rng & Units 03/09/2017 12/28/2016 12/27/2016  Glucose 70 - 99 mg/dL 99 409(W124(H) 119(J126(H)  BUN 6 - 23 mg/dL 19 47(W23(H) 29(F21(H)  Creatinine 0.40 - 1.20 mg/dL 6.21(H1.99(H) 0.86(V1.13(H) 7.84(O1.50(H)  Sodium 135 - 145 mEq/L 126(L) 132(L) 131(L)  Potassium 3.5 - 5.1 mEq/L 4.2 4.7 4.6  Chloride 96 - 112 mEq/L 85(L) 91(L) 91(L)  CO2 19 - 32 mEq/L 35(H) 29 27  Calcium 8.4 - 10.5 mg/dL 9.8 9.3 9.6(E8.7(L)    BNP    Component Value Date/Time   BNP 190.3 (H) 12/27/2016 0924    ProBNP    Component Value Date/Time   PROBNP 51.0 03/09/2017 1645    PFT    Component Value Date/Time   FEV1PRE 1.53 03/17/2017 1510   FEV1POST 1.65 03/17/2017 1510   FVCPRE 1.91 03/17/2017 1510   FVCPOST 2.03 03/17/2017 1510   TLC 4.36 03/17/2017 1510   DLCOUNC 15.33 03/17/2017 1510   PREFEV1FVCRT 80 03/17/2017 1510   PSTFEV1FVCRT 82 03/17/2017 1510    Ct Chest Wo Contrast  Result Date: 06/14/2017 CLINICAL DATA:  Follow up inflammatory changes within the right lung. EXAM: CT CHEST WITHOUT CONTRAST TECHNIQUE: Multidetector CT imaging of the chest was performed following the standard protocol without IV contrast. COMPARISON:  03/17/2017 FINDINGS: Cardiovascular: The heart size appears normal. Aortic atherosclerosis identified. There is  no pericardial effusion. Calcifications in the LAD and left circumflex coronary artery. Mediastinum/Nodes: The trachea appears patent and is midline. Normal appearance of the esophagus. No mediastinal or hilar adenopathy identified. Lungs/Pleura: No pleural effusion. Cluster of subpleural and perifissural tree-in-bud nodules identified within the posterior basal right upper lobe and superior segment of  right lower lobe which appear unchanged from the previous exam, image 39 of series 3 and image 46 of series 3. New ground-glass and solid nodule of within the posterior right upper lobe is identified, image 74 of series 3. This measures 1.5 cm. Upper Abdomen: No acute abnormality. Musculoskeletal: No aggressive lytic or sclerotic bone lesions identified. IMPRESSION: 1. Stable appearance of clustered nodules within the right lung which are likely the sequelae of inflammatory or infectious bronchiolitis. 2. New part solid nodules identified within the right upper lobe. Nonspecific. Follow-up non-contrast CT recommended at 3-6 months to confirm persistence. If unchanged, and solid component remains <6 mm, annual CT is recommended until 5 years of stability has been established. If persistent these nodules should be considered highly suspicious if the solid component of the nodule is 6 mm or greater in size and enlarging. This recommendation follows the consensus statement: Guidelines for Management of Incidental Pulmonary Nodules Detected on CT Images:From the Fleischner Society 2017; published online before print (10.1148/radiol.9528413244). 3. Aortic Atherosclerosis (ICD10-I70.0). Multi vessel coronary artery calcification noted. Electronically Signed   By: Signa Kell M.D.   On: 06/14/2017 08:40   Xr Os Calcis Right  Result Date: 06/20/2017 Radiographs of right heel show the heel ulcer with silver nitrate probing. Does not probe to bone. No bony changes consistent with osteomyelitis.    Past medical hx Past Medical History:  Diagnosis Date  . Carpal tunnel syndrome   . Chronic pain   . Depression   . Diabetes mellitus   . Fibromyalgia   . GERD (gastroesophageal reflux disease)   . Hernia   . Hyperlipemia   . Hypertension   . Miscarriage    twins  . Peripheral neuropathy   . TMJ syndrome      Social History  Substance Use Topics  . Smoking status: Former Smoker    Packs/day: 1.00     Years: 32.00    Types: Cigarettes    Quit date: 11/15/2008  . Smokeless tobacco: Never Used  . Alcohol use No    Ms.Mcelreath reports that she quit smoking about 8 years ago. Her smoking use included Cigarettes. She has a 32.00 pack-year smoking history. She has never used smokeless tobacco. She reports that she does not drink alcohol or use drugs.  Tobacco Cessation: Former smoker with a 32-pack-year history quit 2010  Past surgical hx, Family hx, Social hx all reviewed.  Current Outpatient Prescriptions on File Prior to Visit  Medication Sig  . Albuterol Sulfate 108 (90 Base) MCG/ACT AEPB Inhale 2 puffs into the lungs every 6 (six) hours as needed (for wheezing).  Marland Kitchen allopurinol (ZYLOPRIM) 100 MG tablet Take 1 tablet (100 mg total) by mouth 2 (two) times daily. Take 2 tablets by mouth 2 times daily.  . benazepril-hydrochlorthiazide (LOTENSIN HCT) 20-25 MG per tablet Take 1 tablet by mouth daily.  . colchicine 0.6 MG tablet Take 1 tablet (0.6 mg total) by mouth 2 (two) times daily. Twice daily until gout flare resolves  . ferrous sulfate 325 (65 FE) MG tablet Take 325 mg by mouth daily with breakfast.  . gabapentin (NEURONTIN) 300 MG capsule Take 3 capsules (900 mg total) by  mouth 3 (three) times daily.  Marland Kitchen levothyroxine (SYNTHROID, LEVOTHROID) 50 MCG tablet Take 50 mcg by mouth daily before breakfast.   . metformin (FORTAMET) 500 MG (OSM) 24 hr tablet Take 500 mg by mouth daily with breakfast.  . omeprazole (PRILOSEC) 40 MG capsule Take 40 mg by mouth daily.  Marland Kitchen oxycodone (ROXICODONE) 30 MG immediate release tablet Take 30 mg by mouth every 4 (four) hours as needed for pain.  . SUMAtriptan (IMITREX) 100 MG tablet Take 100 mg by mouth every 2 (two) hours as needed for migraine.    No current facility-administered medications on file prior to visit.      No Known Allergies  Review Of Systems:  Constitutional:   No  weight loss, night sweats,  Fevers, chills, + fatigue, or   lassitude.  HEENT:   No headaches,  Difficulty swallowing,  Tooth/dental problems, or  Sore throat,                No sneezing, itching, ear ache, nasal congestion, post nasal drip,   CV:  No chest pain,  Orthopnea, PND, swelling in lower extremities, anasarca, dizziness, palpitations, syncope.   GI  No heartburn, indigestion, abdominal pain, nausea, vomiting, diarrhea, change in bowel habits, loss of appetite, bloody stools.   Resp: + shortness of breath with exertion or at rest.  No excess mucus, no productive cough,  No non-productive cough,  No coughing up of blood.  No change in color of mucus.   wheezing.  No chest wall deformity  Skin: no rash or lesions.  GU: no dysuria, change in color of urine, no urgency or frequency.  No flank pain, no hematuria   MS:  No joint pain or swelling.  No decreased range of motion.  No back pain.  Psych:  No change in mood or affect. No depression or anxiety.  No memory loss.   Vital Signs BP 126/70 (BP Location: Right Arm, Patient Position: Sitting, Cuff Size: Normal)   Pulse 79   Ht 5\' 3"  (1.6 m)   Wt 247 lb (112 kg)   SpO2 94%   BMI 43.75 kg/m    Physical Exam:  General- No distress,  A&Ox3, pleasant ENT: No sinus tenderness, TM clear, pale nasal mucosa, no oral exudate,no post nasal drip, no LAN Cardiac: S1, S2, regular rate and rhythm, no murmur Chest: No wheeze/ rales/ dullness; no accessory muscle use, no nasal flaring, no sternal retractions, diminished per bases bilaterally Abd.: Soft Non-tender, nondistended, bowel sounds positive, obese  Ext: No clubbing cyanosis, 1+ edema, dressing to right foot Neuro:  normal strength, deconditioned at baseline Skin: No rashes, warm and dry Psych: normal mood and behavior   Assessment/Plan  Sleep related hypoxia Nocturnal desaturations per sleep study 05/04/2017 We will place order for nocturnal oxygen 2 L nasal cannula while awaiting patient to have CPAP titration Follow-up with Dr.  Craige Cotta in September as is scheduled  OSA (obstructive sleep apnea) Overnight sleep study 05/04/2017: Diagnosis :Mild obstructive sleep apnea AHI equals 5.9 per hour and oxygen saturation low 71% Recommendation: given degree of oxygen desaturation in addition to sleep apnea, she should be referred back to the sleep lab for CPAP titration study. Patient is currently overwhelmed with current medical management to save her right foot. Discussed life shortening consequences of untreated OSA Patient verbalized understanding, but continues to wish to wait until October to address OSA Plan We will place an order for CPAP titration in October ( If you feel you can  do this sooner please call us) We will order nocturnal  oxygen . Wear this at 2 L  at bedtime. Continue using your Albuterol rescue inhaler for your shortness of breath. Follow up CT scan in 09/2017 for follow up of pulmonary nodule. Follow up appointment with Dr. Craige Cotta Sept. 25th as is already scheduled. Confirm CPAP titration for October at this visit. Please contact office for sooner follow up if symptoms do not improve or worsen or seek emergency care      Bevelyn Ngo, NP 06/29/2017  9:21 PM

## 2017-06-29 NOTE — Patient Instructions (Addendum)
It is nice to meet you today.  We will place an order for CPAP titration in October ( If you feel you can do this sooner please call us) We will order nocturnal  oxygen . Wear this at 2 L [place] at bedtime. Continue using your Albuterol rescue inhaler for your shortness of breath. Follow up CT scan in 09/2017 for follow up of pulmonary nodule. Follow up appointment with Dr. Craige CottaSood Sept. 25th as is already scheduled. Confirm CPAP titration for October at this visit. Please contact office for sooner follow up if symptoms do not improve or worsen or seek emergency care

## 2017-06-29 NOTE — Assessment & Plan Note (Signed)
Nocturnal desaturations per sleep study 05/04/2017 We will place order for nocturnal oxygen 2 L nasal cannula while awaiting patient to have CPAP titration Follow-up with Dr. Craige CottaSood in September as is scheduled

## 2017-06-30 ENCOUNTER — Telehealth: Payer: Self-pay | Admitting: Pulmonary Disease

## 2017-06-30 DIAGNOSIS — E662 Morbid (severe) obesity with alveolar hypoventilation: Secondary | ICD-10-CM

## 2017-06-30 DIAGNOSIS — G4733 Obstructive sleep apnea (adult) (pediatric): Secondary | ICD-10-CM

## 2017-06-30 NOTE — Telephone Encounter (Signed)
Spoke with Alcario DroughtErica from [facility]. She states that pt is needing a new sleep study to qualify her for night time oxygen. Her last sleep study was over 30 days ago.  SG - please advise, can we order another split night? Thanks.

## 2017-06-30 NOTE — Progress Notes (Signed)
Office Visit Note   Patient: Erin [name]           Date of Birth: Mar 29, 1956           MRN: [id number] Visit Date: 06/24/2017              Requested by: Samuel Jester, DO [address] [place], [place] [address] PCP: Samuel Jester, DO  Chief Complaint  Patient presents with  . Right Leg - Wound Check    Unna, silvercell and dynaflex compression heel ulcer      HPI: The patient is a 61 year old woman who presents today in follow-up for ulceration to her right heel this is been ongoing for several weeks. For the last week she's been in a Unna and Dynaflex wrap with silver cell against the wound. Wearing a compression stocking to left lower extremity. This is draining through the wrap.   Assessment & Plan: Visit Diagnoses:  1. Idiopathic chronic venous hypertension of both lower extremities with inflammation   2. Ulcer of right heel, with fat layer exposed (HCC)     Plan:  will have her begin daily Silvadene dressing changes she'll pack the wound gauze wear compression garment elevate the lower extremities as able. Minimize weightbearing on the right.  Follow-Up Instructions: Return in about 2 weeks (around 07/08/2017).   Ortho Exam  Patient is alert, oriented, no adenopathy, well-dressed, normal affect, normal respiratory effort. On examination the left lower extremity there is improved swelling this is no longer pitting there is no open ulceration. The right lower extremity with good wrinkling of the skin from the compression wrapping. The him ulceration is stable this is about 5 mm deep there is exudative tissue as well as muscle wound bed there is no purulence present probe to bone. There is some surrounding maceration considerable hypertrophic callus was part with a 10 blade knife back to viable tissue.  Imaging: No results found. No images are attached to the encounter.  Labs: Lab Results  Component Value Date   HGBA1C 6.1 (H) 12/28/2016   HGBA1C 6.0 (H)  07/04/2015   HGBA1C 5.6 12/10/2014   ESRSEDRATE 90 (H) 07/05/2015   ESRSEDRATE 45 (H) 12/10/2014   LABURIC 8.4 (H) 02/16/2017   LABURIC 6.5 11/23/2016   LABURIC 8.3 (H) 11/02/2016    Orders:  No orders of the defined types were placed in this encounter.  No orders of the defined types were placed in this encounter.    Procedures: No procedures performed  Clinical Data: No additional findings.  ROS:  All other systems negative, except as noted in the HPI. Review of Systems  Constitutional: Negative for chills and fever.  Cardiovascular: Positive for leg swelling.  Skin: Positive for wound.    Objective: Vital Signs: Ht 5\' 2"  (1.575 m)   Wt 250 lb (113.4 kg)   BMI 45.73 kg/m   Specialty Comments:  No specialty comments available.  PMFS History: Patient Active Problem List   Diagnosis Date Noted  . OSA (obstructive sleep apnea) 05/24/2017  . Sleep related hypoxia 05/24/2017  . Unilateral primary osteoarthritis, left knee 05/09/2017  . Pulmonary hypertension, low resistance (HCC) 04/06/2017  . At risk for obstructive sleep apnea 04/06/2017  . Ulcer of left heel, limited to breakdown of skin (HCC) 03/21/2017  . Idiopathic chronic venous hypertension of both lower extremities with inflammation 03/21/2017  . 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 Influenza with respiratory manifestation 12/28/2016  . Acute  kidney injury (HCC) 12/28/2016  . CKD (chronic kidney disease) stage 3, GFR 30-59 ml/min 12/28/2016  . Hypoxia 12/27/2016  . Idiopathic chronic gout of right ankle without tophus 11/23/2016  . Pain in right foot 11/23/2016  . 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 Essential hypertension 07/06/2015  . Fibromyalgia 07/06/2015  . Dyspnea 07/04/2015  . Diabetic neuropathy (HCC) 12/10/2014   Past Medical History:  Diagnosis Date  . Carpal tunnel syndrome   . Chronic pain   . Depression     . Diabetes mellitus   . Fibromyalgia   . GERD (gastroesophageal reflux disease)   . Hernia   . Hyperlipemia   . Hypertension   . Miscarriage    twins  . Peripheral neuropathy   . TMJ syndrome     Family History  Problem Relation Age of Onset  . Aneurysm Father        Deceased, 1482  . CAD Father   . Heart disease Father   . Cancer Father   . Pancreatic cancer Mother        Deceased, 7167    Past Surgical History:  Procedure Laterality Date  . ABDOMINAL SURGERY    . PARTIAL HYSTERECTOMY  1983  . TONSILLECTOMY    . TUBAL LIGATION  1983  . VAGINAL HYSTERECTOMY  2002   Social History   Occupational History  . disabled    Social History Main Topics  . Smoking status: Former Smoker    Packs/day: 1.00    Years: 32.00    Types: Cigarettes    Quit date: 11/15/2008  . Smokeless tobacco: Never Used  . Alcohol use No  . Drug use: No  . Sexual activity: Not on file

## 2017-06-30 NOTE — Telephone Encounter (Signed)
I have called [facility] and lmomx 1 for Erin [name] to call back.  Looks like the order that was sent in for the oxygen was on 06/29/17.  Need to see what she means by it is over 6630 days old, her appt with SG was on 06/29/17 as well.

## 2017-06-30 NOTE — Telephone Encounter (Signed)
er[facility] form [facility] returning call and can be reached @ [phone].Caren GriffinsStanley A Dalton

## 2017-07-01 ENCOUNTER — Other Ambulatory Visit: Payer: Self-pay | Admitting: Acute Care

## 2017-07-01 ENCOUNTER — Ambulatory Visit: Payer: BLUE CROSS/BLUE SHIELD | Admitting: Neurology

## 2017-07-01 DIAGNOSIS — R0902 Hypoxemia: Secondary | ICD-10-CM

## 2017-07-01 DIAGNOSIS — I272 Pulmonary hypertension, unspecified: Secondary | ICD-10-CM

## 2017-07-01 NOTE — Addendum Note (Signed)
Addended by: Jaynee Eagles C on: 07/01/2017 04:32 PM   Modules accepted: Orders

## 2017-07-01 NOTE — Telephone Encounter (Signed)
Spoke with Erin [name] at [facility] - aware that we are placing an order for Split Night Study for October 2018. Nothing further needed.

## 2017-07-01 NOTE — Telephone Encounter (Signed)
Patient is overwhelmed currently and did not want have a sleep study until October. We will try and get nocturnal oxygen in the interim. As she cannot have nocturnal oxygen she has the overnight study, we will just schedule at 4 October please Please order split night study for October 2018. At that time we will be assessing for OSA/OHS. Thank so much

## 2017-07-03 NOTE — Progress Notes (Signed)
I have reviewed and agree with assessment/plan.  Coralyn Helling, MD [facility] Pulmonary/Critical Care 07/03/2017, 10:58 AM Pager:  [phone]

## 2017-07-07 ENCOUNTER — Ambulatory Visit (INDEPENDENT_AMBULATORY_CARE_PROVIDER_SITE_OTHER): Payer: BLUE CROSS/BLUE SHIELD | Admitting: Family

## 2017-07-07 DIAGNOSIS — L97413 Non-pressure chronic ulcer of right heel and midfoot with necrosis of muscle: Secondary | ICD-10-CM | POA: Diagnosis not present

## 2017-07-07 DIAGNOSIS — L97421 Non-pressure chronic ulcer of left heel and midfoot limited to breakdown of skin: Secondary | ICD-10-CM

## 2017-07-07 DIAGNOSIS — I87323 Chronic venous hypertension (idiopathic) with inflammation of bilateral lower extremity: Secondary | ICD-10-CM | POA: Diagnosis not present

## 2017-07-11 NOTE — Progress Notes (Signed)
Office Visit Note   Patient: Erin [name]           Date of Birth: 1956-02-16           MRN: [id number] Visit Date: 07/07/2017              Requested by: Samuel Jester, DO [address] [place], [place] [address] PCP: Samuel Jester, DO  No chief complaint on file.     HPI: The patient is a 61 year old woman who presents today in follow-up for ulceration to bilateral heels this is been ongoing for months now. For the last week she's been in a Unna and Dynaflex wrap with silver cell against the wound on the right. Wearing a compression stocking to left lower extremity. Applying Eucerin to left heel daily.  Assessment & Plan: Visit Diagnoses:  1. Idiopathic chronic venous hypertension of both lower extremities with inflammation   2. Ulcer of left heel, limited to breakdown of skin (HCC)   3. Ulcer of right heel, with necrosis of muscle (HCC)     Plan:  will have her begin daily Silvadene dressing changes she'll pack the wound with iodosorb and gauze wear compression garment elevate the lower extremities as able. Minimize weightbearing on the right.  Follow-Up Instructions: Return in about 2 weeks (around 07/21/2017).   Ortho Exam  Patient is alert, oriented, no adenopathy, well-dressed, normal affect, normal respiratory effort. On examination the left lower extremity there is improved swelling this is no longer pitting there is no open ulceration. The right lower extremity with good wrinkling of the skin from the compression wrapping. The ulceration is stable, is 15 mm in diameter, this is about 6 mm deep there is exudative tissue as well as muscle in wound bed, no exposed tendon or bone. there is no purulence, does not probe to bone. There is some surrounding maceration considerable hypertrophic callus was pared with a 10 blade knife back to viable tissue. The left lower extremity with trace edema, there is a fissure to this heel, is 25 mm long 2 mm wide, 2 mm deep. Some dried  blood, no erythema or odor. No sign of infection.  Imaging: No results found. No images are attached to the encounter.  Labs: Lab Results  Component Value Date   HGBA1C 6.1 (H) 12/28/2016   HGBA1C 6.0 (H) 07/04/2015   HGBA1C 5.6 12/10/2014   ESRSEDRATE 90 (H) 07/05/2015   ESRSEDRATE 45 (H) 12/10/2014   LABURIC 8.4 (H) 02/16/2017   LABURIC 6.5 11/23/2016   LABURIC 8.3 (H) 11/02/2016    Orders:  Orders Placed This Encounter  Procedures  . MR Foot Right w/o contrast   No orders of the defined types were placed in this encounter.    Procedures: No procedures performed  Clinical Data: No additional findings.  ROS:  All other systems negative, except as noted in the HPI. Review of Systems  Constitutional: Negative for chills and fever.  Cardiovascular: Positive for leg swelling.  Skin: Positive for wound.    Objective: Vital Signs: There were no vitals taken for this visit.  Specialty Comments:  No specialty comments available.  PMFS History: Patient Active Problem List   Diagnosis Date Noted  . OSA (obstructive sleep apnea) 05/24/2017  . Sleep related hypoxia 05/24/2017  . Unilateral primary osteoarthritis, left knee 05/09/2017  . Pulmonary hypertension, low resistance (HCC) 04/06/2017  . At risk for obstructive sleep apnea 04/06/2017  . Ulcer of left heel, limited to breakdown of  skin (HCC) 03/21/2017  . Idiopathic chronic venous hypertension of both lower extremities with inflammation 03/21/2017  . 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 Influenza with respiratory manifestation 12/28/2016  . Acute kidney injury (HCC) 12/28/2016  . CKD (chronic kidney disease) stage 3, GFR 30-59 ml/min 12/28/2016  . Hypoxia 12/27/2016  . Idiopathic chronic gout of right ankle without tophus 11/23/2016  . Pain in right foot 11/23/2016  . 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 Essential hypertension  07/06/2015  . Fibromyalgia 07/06/2015  . Dyspnea 07/04/2015  . Diabetic neuropathy (HCC) 12/10/2014   Past Medical History:  Diagnosis Date  . Carpal tunnel syndrome   . Chronic pain   . Depression   . Diabetes mellitus   . Fibromyalgia   . GERD (gastroesophageal reflux disease)   . Hernia   . Hyperlipemia   . Hypertension   . Miscarriage    twins  . Peripheral neuropathy   . TMJ syndrome     Family History  Problem Relation Age of Onset  . Aneurysm Father        Deceased, 75  . CAD Father   . Heart disease Father   . Cancer Father   . Pancreatic cancer Mother        Deceased, 72    Past Surgical History:  Procedure Laterality Date  . ABDOMINAL SURGERY    . PARTIAL HYSTERECTOMY  1983  . TONSILLECTOMY    . TUBAL LIGATION  1983  . VAGINAL HYSTERECTOMY  2002   Social History   Occupational History  . disabled    Social History Main Topics  . Smoking status: Former Smoker    Packs/day: 1.00    Years: 32.00    Types: Cigarettes    Quit date: 11/15/2008  . Smokeless tobacco: Never Used  . Alcohol use No  . Drug use: No  . Sexual activity: Not on file

## 2017-07-13 ENCOUNTER — Ambulatory Visit (INDEPENDENT_AMBULATORY_CARE_PROVIDER_SITE_OTHER): Payer: BLUE CROSS/BLUE SHIELD

## 2017-07-13 ENCOUNTER — Encounter (INDEPENDENT_AMBULATORY_CARE_PROVIDER_SITE_OTHER): Payer: Self-pay | Admitting: Orthopedic Surgery

## 2017-07-13 ENCOUNTER — Other Ambulatory Visit (INDEPENDENT_AMBULATORY_CARE_PROVIDER_SITE_OTHER): Payer: Self-pay | Admitting: Family

## 2017-07-13 ENCOUNTER — Ambulatory Visit (INDEPENDENT_AMBULATORY_CARE_PROVIDER_SITE_OTHER): Payer: BLUE CROSS/BLUE SHIELD | Admitting: Orthopedic Surgery

## 2017-07-13 DIAGNOSIS — M86171 Other acute osteomyelitis, right ankle and foot: Secondary | ICD-10-CM

## 2017-07-13 DIAGNOSIS — E11610 Type 2 diabetes mellitus with diabetic neuropathic arthropathy: Secondary | ICD-10-CM | POA: Insufficient documentation

## 2017-07-13 DIAGNOSIS — M25571 Pain in right ankle and joints of right foot: Secondary | ICD-10-CM | POA: Diagnosis not present

## 2017-07-13 NOTE — Progress Notes (Signed)
Office Visit Note   Patient: Erin [name]           Date of Birth: 09/25/1956           MRN: [id number] Visit Date: 07/13/2017              Requested by: Samuel Jester, DO [address] [place], [place] [address] PCP: Samuel Jester, DO  Chief Complaint  Patient presents with  . Right Ankle - Injury  . Right Foot - Injury, Wound Check      HPI: Patient is a 61 year old woman diabetic insensate neuropathy previously had a Wagner grade 1 ulcer of the right heel with all fractures to the midfoot great toe amputation who presents with acute dislocation and swelling through the midfoot. Patient states that she just woke up this morning with the foot deformity. She denies any trauma. Denies any pain.  Assessment & Plan: Visit Diagnoses:  1. Pain in right ankle and joints of right foot   2. Diabetic Charcot foot (HCC)   3. Acute osteomyelitis of right calcaneus [facility])     Plan: Patient has osteomyelitis of the calcaneus unstable talar neck Charcot fracture with fracture dislocation of the tibial talar joint with displacement of the medial malleolus. Patient does not have foot salvage options due to the extensive bony destruction and osteomyelitis. Will require a transtibial amputation. Risks and benefits were discussed postoperative course was discussed patient states she understands wishes to proceed at this time. Plan for surgery on Friday.  Follow-Up Instructions: Return in about 2 weeks (around 07/27/2017).   Ortho Exam  Patient is alert, oriented, no adenopathy, well-dressed, normal affect, normal respiratory effort. Examination patient was crawling this morning instead of ambulating. She is currently in a wheelchair. She has varus collapse of the ankle with swelling. There is an ulcer over the calcaneus which is 2 cm in diameter 2 cm deep and probes all the way down to the calcaneus. There is foul smelling purulent drainage. Patient has unstable Charcot fracture through the  ankle. There is venous stasis swelling but no ulcers.  Imaging: Xr Ankle Complete Right  Result Date: 07/13/2017 Two-view radiographs of the right ankle shows Charcot collapse with dislocation of the tibial talar joint with bony fragments laterally and complete displacement of the talus and medial malleolus.  Xr Foot Complete Right  Result Date: 07/13/2017 Two-view radiographs of the right foot show displaced talar neck Charcot fracture with fracture and displacement the medial malleolus dislocated ankle with avulsion fractures off the distal fibula. There are stable fractures through the midfoot.  No images are attached to the encounter.  Labs: Lab Results  Component Value Date   HGBA1C 6.1 (H) 12/28/2016   HGBA1C 6.0 (H) 07/04/2015   HGBA1C 5.6 12/10/2014   ESRSEDRATE 90 (H) 07/05/2015   ESRSEDRATE 45 (H) 12/10/2014   LABURIC 8.4 (H) 02/16/2017   LABURIC 6.5 11/23/2016   LABURIC 8.3 (H) 11/02/2016    Orders:  Orders Placed This Encounter  Procedures  . XR Ankle Complete Right  . XR Foot Complete Right   No orders of the defined types were placed in this encounter.    Procedures: No procedures performed  Clinical Data: No additional findings.  ROS:  All other systems negative, except as noted in the HPI. Review of Systems  Objective: Vital Signs: There were no vitals taken for this visit.  Specialty Comments:  No specialty comments available.  PMFS History: Patient Active Problem List  Diagnosis Date Noted  . Acute osteomyelitis of right calcaneus (HCC) 07/13/2017  . Diabetic Charcot foot (HCC) 07/13/2017  . OSA (obstructive sleep apnea) 05/24/2017  . Sleep related hypoxia 05/24/2017  . Unilateral primary osteoarthritis, left knee 05/09/2017  . Pulmonary hypertension, low resistance (HCC) 04/06/2017  . At risk for obstructive sleep apnea 04/06/2017  . Ulcer of left heel, limited to breakdown of skin (HCC) 03/21/2017  . Idiopathic chronic venous  hypertension of both lower extremities with inflammation 03/21/2017  . 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 Influenza with respiratory manifestation 12/28/2016  . Acute kidney injury (HCC) 12/28/2016  . CKD (chronic kidney disease) stage 3, GFR 30-59 ml/min 12/28/2016  . Hypoxia 12/27/2016  . Idiopathic chronic gout of right ankle without tophus 11/23/2016  . Pain in right foot 11/23/2016  . 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 Essential hypertension 07/06/2015  . Fibromyalgia 07/06/2015  . Dyspnea 07/04/2015  . Diabetic neuropathy (HCC) 12/10/2014   Past Medical History:  Diagnosis Date  . Carpal tunnel syndrome   . Chronic pain   . Depression   . Diabetes mellitus   . Fibromyalgia   . GERD (gastroesophageal reflux disease)   . Hernia   . Hyperlipemia   . Hypertension   . Miscarriage    twins  . Peripheral neuropathy   . TMJ syndrome     Family History  Problem Relation Age of Onset  . Aneurysm Father        Deceased, 67  . CAD Father   . Heart disease Father   . Cancer Father   . Pancreatic cancer Mother        Deceased, 88    Past Surgical History:  Procedure Laterality Date  . ABDOMINAL SURGERY    . PARTIAL HYSTERECTOMY  1983  . TONSILLECTOMY    . TUBAL LIGATION  1983  . VAGINAL HYSTERECTOMY  2002   Social History   Occupational History  . disabled    Social History Main Topics  . Smoking status: Former Smoker    Packs/day: 1.00    Years: 32.00    Types: Cigarettes    Quit date: 11/15/2008  . Smokeless tobacco: Never Used  . Alcohol use No  . Drug use: No  . Sexual activity: Not on file

## 2017-07-14 ENCOUNTER — Telehealth: Payer: Self-pay | Admitting: Acute Care

## 2017-07-14 ENCOUNTER — Encounter (HOSPITAL_COMMUNITY): Payer: Self-pay | Admitting: *Deleted

## 2017-07-14 NOTE — Telephone Encounter (Signed)
Pease call patient and let HER KNOW SHE QUALIFIES FOR nocturnal oxygen. Please place order for nocturnal oxygen at 2 L [place]. Thanks

## 2017-07-15 ENCOUNTER — Inpatient Hospital Stay (HOSPITAL_COMMUNITY): Payer: BLUE CROSS/BLUE SHIELD | Admitting: Anesthesiology

## 2017-07-15 ENCOUNTER — Encounter (HOSPITAL_COMMUNITY)
Admission: RE | Disposition: A | Discharge status: Home Health | Payer: Self-pay | Source: Ambulatory Visit | Attending: Orthopedic Surgery

## 2017-07-15 ENCOUNTER — Inpatient Hospital Stay (HOSPITAL_COMMUNITY)
Admission: RE | Admit: 2017-07-15 | Discharge: 2017-07-17 | DRG: 476 | Disposition: A | Discharge status: Home Health | Payer: BLUE CROSS/BLUE SHIELD | Source: Ambulatory Visit | Attending: Orthopedic Surgery | Admitting: Orthopedic Surgery

## 2017-07-15 DIAGNOSIS — E1169 Type 2 diabetes mellitus with other specified complication: Secondary | ICD-10-CM | POA: Diagnosis present

## 2017-07-15 DIAGNOSIS — M14671 Charcot's joint, right ankle and foot: Secondary | ICD-10-CM | POA: Diagnosis present

## 2017-07-15 DIAGNOSIS — M1991 Primary osteoarthritis, unspecified site: Secondary | ICD-10-CM | POA: Diagnosis present

## 2017-07-15 DIAGNOSIS — Z90710 Acquired absence of both cervix and uterus: Secondary | ICD-10-CM | POA: Diagnosis not present

## 2017-07-15 DIAGNOSIS — I10 Essential (primary) hypertension: Secondary | ICD-10-CM | POA: Diagnosis present

## 2017-07-15 DIAGNOSIS — M86171 Other acute osteomyelitis, right ankle and foot: Secondary | ICD-10-CM

## 2017-07-15 DIAGNOSIS — Z87891 Personal history of nicotine dependence: Secondary | ICD-10-CM | POA: Diagnosis not present

## 2017-07-15 DIAGNOSIS — S88111A Complete traumatic amputation at level between knee and ankle, right lower leg, initial encounter: Secondary | ICD-10-CM

## 2017-07-15 DIAGNOSIS — M86671 Other chronic osteomyelitis, right ankle and foot: Principal | ICD-10-CM | POA: Diagnosis present

## 2017-07-15 DIAGNOSIS — M797 Fibromyalgia: Secondary | ICD-10-CM | POA: Diagnosis present

## 2017-07-15 DIAGNOSIS — E11610 Type 2 diabetes mellitus with diabetic neuropathic arthropathy: Secondary | ICD-10-CM

## 2017-07-15 HISTORY — DX: Other complications of anesthesia, initial encounter: T88.59XA

## 2017-07-15 HISTORY — DX: Headache: R51

## 2017-07-15 HISTORY — DX: Charcot's joint, right ankle and foot: M14.671

## 2017-07-15 HISTORY — DX: Personal history of other medical treatment: Z92.89

## 2017-07-15 HISTORY — DX: Nausea with vomiting, unspecified: R11.2

## 2017-07-15 HISTORY — DX: Chronic kidney disease, unspecified: N18.9

## 2017-07-15 HISTORY — DX: Unspecified osteoarthritis, unspecified site: M19.90

## 2017-07-15 HISTORY — PX: AMPUTATION: SHX166

## 2017-07-15 HISTORY — DX: Adverse effect of unspecified anesthetic, initial encounter: T41.45XA

## 2017-07-15 HISTORY — DX: Type 2 diabetes mellitus with diabetic neuropathy, unspecified: E11.40

## 2017-07-15 HISTORY — DX: Headache, unspecified: R51.9

## 2017-07-15 HISTORY — DX: Other specified postprocedural states: Z98.890

## 2017-07-15 LAB — GLUCOSE, CAPILLARY
GLUCOSE-CAPILLARY: 118 mg/dL — AB (ref 65–99)
GLUCOSE-CAPILLARY: 133 mg/dL — AB (ref 65–99)
Glucose-Capillary: 108 mg/dL — ABNORMAL HIGH (ref 65–99)
Glucose-Capillary: 183 mg/dL — ABNORMAL HIGH (ref 65–99)

## 2017-07-15 LAB — CBC
HEMATOCRIT: 26.1 % — AB (ref 36.0–46.0)
Hemoglobin: 8.1 g/dL — ABNORMAL LOW (ref 12.0–15.0)
MCH: 29 pg (ref 26.0–34.0)
MCHC: 31 g/dL (ref 30.0–36.0)
MCV: 93.5 fL (ref 78.0–100.0)
Platelets: 371 10*3/uL (ref 150–400)
RBC: 2.79 MIL/uL — ABNORMAL LOW (ref 3.87–5.11)
RDW: 15.1 % (ref 11.5–15.5)
WBC: 13.9 10*3/uL — AB (ref 4.0–10.5)

## 2017-07-15 LAB — BASIC METABOLIC PANEL
Anion gap: 12 (ref 5–15)
BUN: 14 mg/dL (ref 6–20)
CHLORIDE: 86 mmol/L — AB (ref 101–111)
CO2: 32 mmol/L (ref 22–32)
Calcium: 9.1 mg/dL (ref 8.9–10.3)
Creatinine, Ser: 1.9 mg/dL — ABNORMAL HIGH (ref 0.44–1.00)
GFR calc Af Amer: 32 mL/min — ABNORMAL LOW (ref >60)
GFR calc non Af Amer: 28 mL/min — ABNORMAL LOW (ref >60)
Glucose, Bld: 113 mg/dL — ABNORMAL HIGH (ref 65–99)
POTASSIUM: 3.9 mmol/L (ref 3.5–5.1)
SODIUM: 130 mmol/L — AB (ref 135–145)

## 2017-07-15 LAB — HEMOGLOBIN A1C
Hgb A1c MFr Bld: 5.8 % — ABNORMAL HIGH (ref 4.8–5.6)
Mean Plasma Glucose: 119.76 mg/dL

## 2017-07-15 SURGERY — AMPUTATION BELOW KNEE
Anesthesia: General | Site: Leg Lower | Laterality: Right

## 2017-07-15 MED ORDER — PANTOPRAZOLE SODIUM 40 MG PO TBEC
40.0000 mg | DELAYED_RELEASE_TABLET | Freq: Every day | ORAL | Status: DC
  Administered 2017-07-15 – 2017-07-17 (×3): 40 mg via ORAL
  Filled 2017-07-15 (×3): qty 1

## 2017-07-15 MED ORDER — GABAPENTIN 300 MG PO CAPS
300.0000 mg | ORAL_CAPSULE | ORAL | Status: DC
  Administered 2017-07-15 – 2017-07-17 (×5): 300 mg via ORAL
  Filled 2017-07-15 (×4): qty 1

## 2017-07-15 MED ORDER — PROPOFOL 10 MG/ML IV BOLUS
INTRAVENOUS | Status: DC | PRN
  Administered 2017-07-15: 10 mg via INTRAVENOUS
  Administered 2017-07-15: 30 mg via INTRAVENOUS
  Administered 2017-07-15: 200 mg via INTRAVENOUS

## 2017-07-15 MED ORDER — OXYCODONE HCL 5 MG PO TABS
60.0000 mg | ORAL_TABLET | ORAL | Status: DC | PRN
  Administered 2017-07-15 (×2): 60 mg via ORAL
  Filled 2017-07-15 (×2): qty 12

## 2017-07-15 MED ORDER — PROMETHAZINE HCL 25 MG/ML IJ SOLN: 6.2500 mg | INTRAMUSCULAR | Status: DC | PRN

## 2017-07-15 MED ORDER — METHOCARBAMOL 1000 MG/10ML IJ SOLN
500.0000 mg | Freq: Four times a day (QID) | INTRAVENOUS | Status: DC | PRN
  Filled 2017-07-15: qty 5

## 2017-07-15 MED ORDER — SUMATRIPTAN SUCCINATE 100 MG PO TABS
100.0000 mg | ORAL_TABLET | ORAL | Status: DC | PRN
  Administered 2017-07-16: 100 mg via ORAL
  Filled 2017-07-15 (×3): qty 1

## 2017-07-15 MED ORDER — HYDROMORPHONE HCL 1 MG/ML IJ SOLN
1.0000 mg | INTRAMUSCULAR | Status: DC | PRN
  Administered 2017-07-15: 1 mg via INTRAVENOUS
  Filled 2017-07-15: qty 1

## 2017-07-15 MED ORDER — METFORMIN HCL ER 500 MG PO TB24: 500.0000 mg | ORAL_TABLET | Freq: Every day | ORAL | Status: DC

## 2017-07-15 MED ORDER — MAGNESIUM CITRATE PO SOLN: 1.0000 | Freq: Once | ORAL | Status: DC | PRN

## 2017-07-15 MED ORDER — LACTATED RINGERS IV SOLN
INTRAVENOUS | Status: DC | PRN
  Administered 2017-07-15 (×3): via INTRAVENOUS

## 2017-07-15 MED ORDER — POLYETHYLENE GLYCOL 3350 17 G PO PACK
17.0000 g | PACK | Freq: Every day | ORAL | Status: DC | PRN
  Administered 2017-07-16 – 2017-07-17 (×2): 17 g via ORAL
  Filled 2017-07-15 (×2): qty 1

## 2017-07-15 MED ORDER — GABAPENTIN 600 MG PO TABS
600.0000 mg | ORAL_TABLET | Freq: Every day | ORAL | Status: DC
  Administered 2017-07-15 – 2017-07-17 (×2): 600 mg via ORAL
  Filled 2017-07-15 (×3): qty 1

## 2017-07-15 MED ORDER — ALBUTEROL SULFATE (2.5 MG/3ML) 0.083% IN NEBU: 3.0000 mL | INHALATION_SOLUTION | Freq: Four times a day (QID) | RESPIRATORY_TRACT | Status: DC | PRN

## 2017-07-15 MED ORDER — FUROSEMIDE 40 MG PO TABS
40.0000 mg | ORAL_TABLET | Freq: Every day | ORAL | Status: DC
  Administered 2017-07-15: 40 mg via ORAL
  Filled 2017-07-15: qty 1

## 2017-07-15 MED ORDER — CEFAZOLIN SODIUM-DEXTROSE 2-4 GM/100ML-% IV SOLN
2.0000 g | Freq: Four times a day (QID) | INTRAVENOUS | Status: AC
  Administered 2017-07-15 – 2017-07-16 (×3): 2 g via INTRAVENOUS
  Filled 2017-07-15 (×3): qty 100

## 2017-07-15 MED ORDER — HYDROMORPHONE HCL 1 MG/ML IJ SOLN
INTRAMUSCULAR | Status: AC
  Filled 2017-07-15: qty 2

## 2017-07-15 MED ORDER — BENAZEPRIL-HYDROCHLOROTHIAZIDE 20-25 MG PO TABS: 1.0000 | ORAL_TABLET | Freq: Every day | ORAL | Status: DC

## 2017-07-15 MED ORDER — CEFAZOLIN SODIUM-DEXTROSE 2-4 GM/100ML-% IV SOLN
2.0000 g | INTRAVENOUS | Status: AC
  Administered 2017-07-15: 2 g via INTRAVENOUS
  Filled 2017-07-15: qty 100

## 2017-07-15 MED ORDER — CHLORHEXIDINE GLUCONATE 4 % EX LIQD: 60.0000 mL | Freq: Once | CUTANEOUS | Status: DC

## 2017-07-15 MED ORDER — PHENYLEPHRINE HCL 10 MG/ML IJ SOLN
INTRAMUSCULAR | Status: DC | PRN
  Administered 2017-07-15 (×3): 80 ug via INTRAVENOUS

## 2017-07-15 MED ORDER — METHOCARBAMOL 500 MG PO TABS
500.0000 mg | ORAL_TABLET | Freq: Four times a day (QID) | ORAL | Status: DC | PRN
Start: 2017-07-15 — End: 2017-07-17
  Administered 2017-07-15 – 2017-07-17 (×8): 500 mg via ORAL
  Filled 2017-07-15 (×7): qty 1

## 2017-07-15 MED ORDER — LEVOTHYROXINE SODIUM 50 MCG PO TABS
50.0000 ug | ORAL_TABLET | Freq: Every day | ORAL | Status: DC
  Administered 2017-07-16 – 2017-07-17 (×2): 50 ug via ORAL
  Filled 2017-07-15 (×2): qty 1

## 2017-07-15 MED ORDER — METOCLOPRAMIDE HCL 5 MG/ML IJ SOLN: 5.0000 mg | Freq: Three times a day (TID) | INTRAMUSCULAR | Status: DC | PRN

## 2017-07-15 MED ORDER — ALLOPURINOL 100 MG PO TABS
100.0000 mg | ORAL_TABLET | Freq: Two times a day (BID) | ORAL | Status: DC
  Administered 2017-07-15 – 2017-07-17 (×4): 100 mg via ORAL
  Filled 2017-07-15 (×4): qty 1

## 2017-07-15 MED ORDER — ASPIRIN EC 325 MG PO TBEC
325.0000 mg | DELAYED_RELEASE_TABLET | Freq: Every day | ORAL | Status: DC
  Administered 2017-07-16 – 2017-07-17 (×2): 325 mg via ORAL
  Filled 2017-07-15 (×2): qty 1

## 2017-07-15 MED ORDER — HYDROMORPHONE HCL 1 MG/ML IJ SOLN
0.2500 mg | INTRAMUSCULAR | Status: DC | PRN
  Administered 2017-07-15 (×4): 0.5 mg via INTRAVENOUS

## 2017-07-15 MED ORDER — KETOROLAC TROMETHAMINE 15 MG/ML IJ SOLN
INTRAMUSCULAR | Status: AC
  Administered 2017-07-15: 15 mg
  Filled 2017-07-15: qty 1

## 2017-07-15 MED ORDER — FENTANYL CITRATE (PF) 250 MCG/5ML IJ SOLN
INTRAMUSCULAR | Status: AC
  Filled 2017-07-15: qty 5

## 2017-07-15 MED ORDER — FENTANYL CITRATE (PF) 100 MCG/2ML IJ SOLN
INTRAMUSCULAR | Status: DC | PRN
  Administered 2017-07-15 (×2): 50 ug via INTRAVENOUS
  Administered 2017-07-15 (×2): 100 ug via INTRAVENOUS
  Administered 2017-07-15 (×4): 50 ug via INTRAVENOUS

## 2017-07-15 MED ORDER — METOCLOPRAMIDE HCL 5 MG PO TABS: 5.0000 mg | ORAL_TABLET | Freq: Three times a day (TID) | ORAL | Status: DC | PRN

## 2017-07-15 MED ORDER — SODIUM CHLORIDE 0.9 % IV SOLN
INTRAVENOUS | Status: DC
  Administered 2017-07-15: 17:00:00 via INTRAVENOUS

## 2017-07-15 MED ORDER — MORPHINE SULFATE (PF) 4 MG/ML IV SOLN
INTRAVENOUS | Status: AC
  Filled 2017-07-15: qty 1

## 2017-07-15 MED ORDER — DEXAMETHASONE SODIUM PHOSPHATE 10 MG/ML IJ SOLN
INTRAMUSCULAR | Status: DC | PRN
  Administered 2017-07-15: 5 mg via INTRAVENOUS

## 2017-07-15 MED ORDER — FERROUS SULFATE 325 (65 FE) MG PO TABS
325.0000 mg | ORAL_TABLET | Freq: Every day | ORAL | Status: DC
  Administered 2017-07-16 – 2017-07-17 (×2): 325 mg via ORAL
  Filled 2017-07-15 (×2): qty 1

## 2017-07-15 MED ORDER — BISACODYL 10 MG RE SUPP: 10.0000 mg | Freq: Every day | RECTAL | Status: DC | PRN

## 2017-07-15 MED ORDER — PHENYLEPHRINE 40 MCG/ML (10ML) SYRINGE FOR IV PUSH (FOR BLOOD PRESSURE SUPPORT)
PREFILLED_SYRINGE | INTRAVENOUS | Status: AC
  Filled 2017-07-15: qty 10

## 2017-07-15 MED ORDER — METHOCARBAMOL 500 MG PO TABS
ORAL_TABLET | ORAL | Status: AC
  Filled 2017-07-15: qty 1

## 2017-07-15 MED ORDER — ONDANSETRON HCL 4 MG PO TABS: 4.0000 mg | ORAL_TABLET | Freq: Four times a day (QID) | ORAL | Status: DC | PRN

## 2017-07-15 MED ORDER — DEXMEDETOMIDINE HCL IN NACL 200 MCG/50ML IV SOLN
INTRAVENOUS | Status: DC | PRN
  Administered 2017-07-15 (×7): 8 ug via INTRAVENOUS

## 2017-07-15 MED ORDER — FENTANYL CITRATE (PF) 250 MCG/5ML IJ SOLN
INTRAMUSCULAR | Status: AC
Start: 2017-07-15 — End: ?
  Filled 2017-07-15: qty 5

## 2017-07-15 MED ORDER — LIDOCAINE HCL (CARDIAC) 20 MG/ML IV SOLN
INTRAVENOUS | Status: DC | PRN
  Administered 2017-07-15: 60 mg via INTRAVENOUS

## 2017-07-15 MED ORDER — MIDAZOLAM HCL 2 MG/2ML IJ SOLN
INTRAMUSCULAR | Status: AC
  Filled 2017-07-15: qty 2

## 2017-07-15 MED ORDER — 0.9 % SODIUM CHLORIDE (POUR BTL) OPTIME
TOPICAL | Status: DC | PRN
  Administered 2017-07-15: 1000 mL

## 2017-07-15 MED ORDER — ACETAMINOPHEN 325 MG PO TABS
650.0000 mg | ORAL_TABLET | Freq: Four times a day (QID) | ORAL | Status: DC | PRN
  Administered 2017-07-16: 650 mg via ORAL
  Filled 2017-07-15: qty 2

## 2017-07-15 MED ORDER — INSULIN ASPART 100 UNIT/ML ~~LOC~~ SOLN
0.0000 [IU] | Freq: Three times a day (TID) | SUBCUTANEOUS | Status: DC
Start: 2017-07-15 — End: 2017-07-17
  Administered 2017-07-15: 3 [IU] via SUBCUTANEOUS
  Administered 2017-07-16 – 2017-07-17 (×2): 5 [IU] via SUBCUTANEOUS

## 2017-07-15 MED ORDER — INSULIN ASPART 100 UNIT/ML ~~LOC~~ SOLN
4.0000 [IU] | Freq: Three times a day (TID) | SUBCUTANEOUS | Status: DC
  Administered 2017-07-15 – 2017-07-17 (×6): 4 [IU] via SUBCUTANEOUS

## 2017-07-15 MED ORDER — ONDANSETRON HCL 4 MG/2ML IJ SOLN
4.0000 mg | Freq: Four times a day (QID) | INTRAMUSCULAR | Status: DC | PRN
  Administered 2017-07-17: 4 mg via INTRAVENOUS
  Filled 2017-07-15: qty 2

## 2017-07-15 MED ORDER — DOCUSATE SODIUM 100 MG PO CAPS
100.0000 mg | ORAL_CAPSULE | Freq: Two times a day (BID) | ORAL | Status: DC
  Administered 2017-07-15 – 2017-07-17 (×4): 100 mg via ORAL
  Filled 2017-07-15 (×4): qty 1

## 2017-07-15 MED ORDER — SODIUM CHLORIDE 0.9 % IV SOLN
INTRAVENOUS | Status: DC
  Administered 2017-07-15: 11:00:00 via INTRAVENOUS

## 2017-07-15 MED ORDER — ACETAMINOPHEN 650 MG RE SUPP: 650.0000 mg | Freq: Four times a day (QID) | RECTAL | Status: DC | PRN

## 2017-07-15 MED ORDER — MORPHINE SULFATE (PF) 4 MG/ML IV SOLN
2.0000 mg | INTRAVENOUS | Status: AC | PRN
  Administered 2017-07-15 (×2): 2 mg via INTRAVENOUS

## 2017-07-15 MED ORDER — MIDAZOLAM HCL 5 MG/5ML IJ SOLN
INTRAMUSCULAR | Status: DC | PRN
  Administered 2017-07-15: 2 mg via INTRAVENOUS

## 2017-07-15 MED ORDER — ONDANSETRON HCL 4 MG/2ML IJ SOLN
INTRAMUSCULAR | Status: DC | PRN
  Administered 2017-07-15: 4 mg via INTRAVENOUS

## 2017-07-15 MED ORDER — KETOROLAC TROMETHAMINE 15 MG/ML IJ SOLN: 15.0000 mg | Freq: Four times a day (QID) | INTRAMUSCULAR | Status: DC

## 2017-07-15 MED ORDER — PROPOFOL 10 MG/ML IV BOLUS
INTRAVENOUS | Status: AC
  Filled 2017-07-15: qty 20

## 2017-07-15 MED ORDER — DEXAMETHASONE SODIUM PHOSPHATE 10 MG/ML IJ SOLN
INTRAMUSCULAR | Status: AC
  Filled 2017-07-15: qty 1

## 2017-07-15 SURGICAL SUPPLY — 35 items
BLADE SAW RECIP 87.9 MT (BLADE) ×3 IMPLANT
BLADE SURG 21 STRL SS (BLADE) ×3 IMPLANT
BNDG COHESIVE 6X5 TAN STRL LF (GAUZE/BANDAGES/DRESSINGS) ×6 IMPLANT
BNDG GAUZE ELAST 4 BULKY (GAUZE/BANDAGES/DRESSINGS) ×6 IMPLANT
CANISTER WOUND CARE 500ML ATS (WOUND CARE) ×3 IMPLANT
COVER SURGICAL LIGHT HANDLE (MISCELLANEOUS) ×3 IMPLANT
CUFF TOURNIQUET SINGLE 34IN LL (TOURNIQUET CUFF) IMPLANT
CUFF TOURNIQUET SINGLE 44IN (TOURNIQUET CUFF) IMPLANT
DRAPE INCISE IOBAN 66X45 STRL (DRAPES) IMPLANT
DRAPE U-SHAPE 47X51 STRL (DRAPES) ×3 IMPLANT
DRESSING PREVENA PLUS CUSTOM (GAUZE/BANDAGES/DRESSINGS) ×1 IMPLANT
DRSG PREVENA PLUS CUSTOM (GAUZE/BANDAGES/DRESSINGS) ×3
DRSG VAC ATS MED SENSATRAC (GAUZE/BANDAGES/DRESSINGS) ×3 IMPLANT
ELECT REM PT RETURN 9FT ADLT (ELECTROSURGICAL) ×3
ELECTRODE REM PT RTRN 9FT ADLT (ELECTROSURGICAL) ×1 IMPLANT
GLOVE BIOGEL PI IND STRL 9 (GLOVE) ×1 IMPLANT
GLOVE BIOGEL PI INDICATOR 9 (GLOVE) ×2
GLOVE SURG ORTHO 9.0 STRL STRW (GLOVE) ×3 IMPLANT
GOWN STRL REUS W/ TWL XL LVL3 (GOWN DISPOSABLE) ×2 IMPLANT
GOWN STRL REUS W/TWL XL LVL3 (GOWN DISPOSABLE) ×4
KIT BASIN OR (CUSTOM PROCEDURE TRAY) ×3 IMPLANT
KIT DRSG PREVENA PLUS 7DAY 125 (MISCELLANEOUS) ×3 IMPLANT
KIT ROOM TURNOVER OR (KITS) ×3 IMPLANT
MANIFOLD NEPTUNE II (INSTRUMENTS) ×3 IMPLANT
NS IRRIG 1000ML POUR BTL (IV SOLUTION) ×3 IMPLANT
PACK ORTHO EXTREMITY (CUSTOM PROCEDURE TRAY) ×3 IMPLANT
PAD ARMBOARD 7.5X6 YLW CONV (MISCELLANEOUS) ×3 IMPLANT
SPONGE LAP 18X18 X RAY DECT (DISPOSABLE) IMPLANT
STAPLER VISISTAT 35W (STAPLE) IMPLANT
STOCKINETTE IMPERVIOUS LG (DRAPES) ×3 IMPLANT
SUT SILK 2 0 (SUTURE)
SUT SILK 2 0 TIES 10X30 (SUTURE) ×3 IMPLANT
SUT SILK 2-0 18XBRD TIE 12 (SUTURE) IMPLANT
SUT VIC AB 1 CTX 27 (SUTURE) ×6 IMPLANT
TOWEL OR 17X26 10 PK STRL BLUE (TOWEL DISPOSABLE) ×3 IMPLANT

## 2017-07-15 NOTE — Anesthesia Procedure Notes (Signed)
Procedure Name: LMA Insertion Date/Time: 07/15/2017 1:29 PM Performed by: Dorie RankQUINN, Erin Biondolillo M Pre-anesthesia Checklist: Patient identified, Emergency Drugs available, Suction available, Patient being monitored and Timeout performed Patient Re-evaluated:Patient Re-evaluated prior to induction Oxygen Delivery Method: Circle system utilized Preoxygenation: Pre-oxygenation with 100% oxygen Induction Type: IV induction Ventilation: Mask ventilation without difficulty LMA: LMA inserted Laryngoscope Size: 4 Number of attempts: 1 Placement Confirmation: breath sounds checked- equal and bilateral and positive ETCO2 Tube secured with: Tape Dental Injury: Teeth and Oropharynx as per pre-operative assessment

## 2017-07-15 NOTE — Anesthesia Postprocedure Evaluation (Signed)
Anesthesia Post Note  Patient: Erin MinionCarol F [name]  Procedure(s) Performed: Procedure(s) (LRB): RIGHT BELOW KNEE AMPUTATION (Right)     Patient location during evaluation: PACU Anesthesia Type: General Level of consciousness: awake and sedated Pain management: pain level controlled Vital Signs Assessment: post-procedure vital signs reviewed and stable Respiratory status: spontaneous breathing, nonlabored ventilation, respiratory function stable and patient connected to nasal cannula oxygen Cardiovascular status: blood pressure returned to baseline and stable Postop Assessment: no signs of nausea or vomiting Anesthetic complications: no    Last Vitals:  Vitals:   07/15/17 1500 07/15/17 1523  BP:    Pulse: 69 73  Resp: 18 16  Temp:    SpO2: 92% 93%    Last Pain:  Vitals:   07/15/17 1523  TempSrc:   PainSc: Asleep                 Juliane Guest,JAMES TERRILL

## 2017-07-15 NOTE — H&P (Signed)
Erin [name] is an 61 y.o. female.   Chief Complaint: osteomyelitis right calcaneus. HPI: patient is a 61 year old woman type II diabetic  who has  chronic osteomyelitis of the right calcaneus.with acute Charcot collapse of the right ankle foot.  Past Medical History:  Diagnosis Date  . Arthritis    Osteoarthritis right calcaneous  . Carpal tunnel syndrome   . Charcot ankle, right   . Chronic kidney disease    patient unaware- does not see a nephrologist  . Chronic pain   . Complication of anesthesia   . Depression   . Diabetes mellitus    Tyoe II  . Diabetic neuropathy (HCC)   . Fibromyalgia   . GERD (gastroesophageal reflux disease)   . Headache   . Hernia   . History of blood transfusion    after miscarriage  . Hyperlipemia   . Hypertension   . Miscarriage    twins  . Peripheral neuropathy   . PONV (postoperative nausea and vomiting)    no problem with medication  . TMJ syndrome     Past Surgical History:  Procedure Laterality Date  . ABDOMINAL SURGERY    . BUNIONECTOMY Bilateral    2 separate surgies  . COLONOSCOPY    . PARTIAL HYSTERECTOMY  1983  . TMJ ARTHROPLASTY     left side x 2  . TONSILLECTOMY    . TUBAL LIGATION  1983  . Umbilical hernia     x2  . VAGINAL HYSTERECTOMY  2002    Family History  Problem Relation Age of Onset  . Aneurysm Father        Deceased, 5682  . CAD Father   . Heart disease Father   . Cancer Father   . Pancreatic cancer Mother        Deceased, 3267   Social History:  reports that she quit smoking about 8 years ago. Her smoking use included Cigarettes. She has a 32.00 pack-year smoking history. She has never used smokeless tobacco. She reports that she does not drink alcohol or use drugs.  Allergies: No Known Allergies  No prescriptions prior to admission.    No results found for this or any previous visit (from the past 48 hour(s)). Xr Ankle Complete Right  Result Date: 07/13/2017 Two-view radiographs of the right  ankle shows Charcot collapse with dislocation of the tibial talar joint with bony fragments laterally and complete displacement of the talus and medial malleolus.  Xr Foot Complete Right  Result Date: 07/13/2017 Two-view radiographs of the right foot show displaced talar neck Charcot fracture with fracture and displacement the medial malleolus dislocated ankle with avulsion fractures off the distal fibula. There are stable fractures through the midfoot.   Review of Systems  All other systems reviewed and are negative.   There were no vitals taken for this visit. Physical Exam  On examination patient has a palpable pulse. She has an ulcer that extends down to the calcaneus. She has acute Charcot collapse of the ankle with dislocation of the ankle and Charcot fracture through the medial malleolus with a Charcot fracture through the talar neck as well. Assessment/Plan Assessment: Diabetic insensate neuropathy with osteomyelitis of the calcaneus and Charcot fracture dislocation through the right ankle and right talus.  Plan: We'll plan for right transtibial amputation. Risks and benefits were discussed including infection nonhealing of the wound need for additional surgery. she understands wish to proceed at this time.  Erin MustardMarcus V Amandeep Nesmith, MD 07/15/2017, 7:17 AM

## 2017-07-15 NOTE — Progress Notes (Signed)
Dr. Lajoyce Cornersuda informed of pt.'s h&h, no new orders.

## 2017-07-15 NOTE — Op Note (Signed)
   Date of Surgery: 07/15/2017  INDICATIONS: [name] is a 61 y.o.-year-old female who has diabetic insensate neuropathy with a Charcot collapse of the tibial talar joint as well as the talus with osteomyelitis of the calcaneus.Marland Kitchen.  PREOPERATIVE DIAGNOSIS: Charcot collapse right ankle with displaced talar neck fracture and osteomyelitis of the calcaneus  POSTOPERATIVE DIAGNOSIS: Same.  PROCEDURE: Transtibial amputation right Application of Prevena wound VAC  SURGEON: Lajoyce Cornersuda, M.D.  ANESTHESIA:  general  IV FLUIDS AND URINE: See anesthesia.  ESTIMATED BLOOD LOSS: Minimal mL. Tourniquet time 4 minutes  COMPLICATIONS: None.  DESCRIPTION OF PROCEDURE: The patient was brought to the operating room and underwent a general anesthetic. After adequate levels of anesthesia were obtained patient's lower extremity was prepped using DuraPrep draped into a sterile field. A timeout was called. The foot was draped out of the sterile field with impervious stockinette. A transverse incision was made 11 cm distal to the tibial tubercle. This curved proximally and a large posterior flap was created. The tibia was transected 1 cm proximal to the skin incision. The fibula was transected just proximal to the tibial incision. The tibia was beveled anteriorly. A large posterior flap was created. The sciatic nerve was pulled cut and allowed to retract. The vascular bundles were suture ligated with 2-0 silk. The deep and superficial fascial layers were closed using #1 Vicryl. The skin was closed using staples and 2-0 nylon. The wound was covered with a Prevena wound VAC. There was a good suction fit. . Patient was extubated taken to the PACU in stable condition.  Aldean BakerMarcus Liadan Guizar, MD [facility] 1:59 PM

## 2017-07-15 NOTE — Transfer of Care (Signed)
Immediate Anesthesia Transfer of Care Note  Patient: Erin [name]  Procedure(s) Performed: Procedure(s): RIGHT BELOW KNEE AMPUTATION (Right)  Patient Location: PACU  Anesthesia Type:General  Level of Consciousness: awake, alert  and oriented  Airway & Oxygen Therapy: Patient connected to face mask oxygen  Post-op Assessment: Post -op Vital signs reviewed and stable  Post vital signs: stable  Last Vitals:  Vitals:   07/15/17 1411 07/15/17 1416  BP: 103/81   Pulse:  78  Resp:  14  Temp:    SpO2:  100%    Last Pain:  Vitals:   07/15/17 1416  TempSrc:   PainSc: 10-Worst pain ever         Complications: No apparent anesthesia complications

## 2017-07-15 NOTE — Anesthesia Preprocedure Evaluation (Addendum)
Anesthesia Evaluation  Patient identified by MRN, date of birth, ID band Patient awake    Reviewed: Allergy & Precautions, NPO status , Patient's Chart, lab work & pertinent test results  History of Anesthesia Complications (+) PONV and history of anesthetic complications  Airway Mallampati: III  TM Distance: <3 FB Neck ROM: Full    Dental no notable dental hx. (+) Edentulous Lower, Edentulous Upper, Dental Advisory Given   Pulmonary shortness of breath, sleep apnea , former smoker,    breath sounds clear to auscultation       Cardiovascular hypertension, + Peripheral Vascular Disease   Rhythm:Regular Rate:Normal     Neuro/Psych    GI/Hepatic GERD  ,  Endo/Other  diabetes  Renal/GU Renal disease     Musculoskeletal  (+) Arthritis , Fibromyalgia -  Abdominal   Peds  Hematology   Anesthesia Other Findings   Reproductive/Obstetrics                           Anesthesia Physical Anesthesia Plan  ASA: III  Anesthesia Plan: General   Post-op Pain Management:    Induction: Intravenous  PONV Risk Score and Plan: 3 and Ondansetron, Dexamethasone, Midazolam and Propofol infusion  Airway Management Planned: Oral ETT  Additional Equipment:   Intra-op Plan:   Post-operative Plan: Extubation in OR  Informed Consent: I have reviewed the patients History and Physical, chart, labs and discussed the procedure including the risks, benefits and alternatives for the proposed anesthesia with the patient or authorized representative who has indicated his/her understanding and acceptance.     Plan Discussed with:   Anesthesia Plan Comments:         Anesthesia Quick Evaluation

## 2017-07-16 ENCOUNTER — Encounter (HOSPITAL_COMMUNITY): Payer: Self-pay | Admitting: Orthopedic Surgery

## 2017-07-16 LAB — GLUCOSE, CAPILLARY
GLUCOSE-CAPILLARY: 179 mg/dL — AB (ref 65–99)
GLUCOSE-CAPILLARY: 209 mg/dL — AB (ref 65–99)
Glucose-Capillary: 97 mg/dL (ref 65–99)
Glucose-Capillary: 97 mg/dL (ref 65–99)
Glucose-Capillary: 110 mg/dL — ABNORMAL HIGH (ref 65–99)

## 2017-07-16 LAB — PREPARE RBC (CROSSMATCH)

## 2017-07-16 LAB — ABO/RH: ABO/RH(D): B POS

## 2017-07-16 MED ORDER — SODIUM CHLORIDE 0.9 % IV SOLN: Freq: Once | INTRAVENOUS | Status: DC

## 2017-07-16 MED ORDER — HYDROMORPHONE HCL 1 MG/ML IJ SOLN
1.0000 mg | INTRAMUSCULAR | Status: DC | PRN
  Administered 2017-07-16: 1 mg via INTRAVENOUS
  Filled 2017-07-16: qty 1

## 2017-07-16 MED ORDER — OXYCODONE HCL 5 MG PO TABS
60.0000 mg | ORAL_TABLET | Freq: Four times a day (QID) | ORAL | Status: DC | PRN
  Administered 2017-07-16 – 2017-07-17 (×6): 60 mg via ORAL
  Filled 2017-07-16 (×6): qty 12

## 2017-07-16 NOTE — Progress Notes (Signed)
Patient ID: Erin [name], female   DOB: 12-16-1955, 61 y.o.   MRN: [id number] Patient is comfortable and without complaints this morning. The importance of knee extension exercises were discussed. The wound VAC is functioning well. Patient has good family support at home but she may require discharge to skilled nursing depending on her functional ability for transfers.

## 2017-07-16 NOTE — Progress Notes (Signed)
PT Cancellation Note  Patient Details Name: Erin [name] MRN: [id number] DOB: [birth year]   Cancelled Treatment:    Reason Eval/Treat Not Completed: Other (comment). Pt was unavailable and with nursing taking care of many details of care.  Will try later as time and pt allow.   Ivar DrapeRuth E Della Homan 07/16/2017, 2:46 PM   Samul Dadauth Madilynne Mullan, PT MS Acute Rehab Dept. Number: [facility] [phone] and [facility] [phone]

## 2017-07-17 LAB — TYPE AND SCREEN
ABO/RH(D): B POS
Antibody Screen: NEGATIVE
Unit division: 0

## 2017-07-17 LAB — GLUCOSE, CAPILLARY
Glucose-Capillary: 232 mg/dL — ABNORMAL HIGH (ref 65–99)
Glucose-Capillary: 105 mg/dL — ABNORMAL HIGH (ref 65–99)

## 2017-07-17 LAB — BPAM RBC
Blood Product Expiration Date: 201809182359
ISSUE DATE / TIME: 201809010259
Unit Type and Rh: 7300

## 2017-07-17 MED ORDER — METHOCARBAMOL 500 MG PO TABS: 500.0000 mg | ORAL_TABLET | Freq: Three times a day (TID) | ORAL | 0 refills | Status: DC | PRN

## 2017-07-17 MED ORDER — OXYCODONE HCL 30 MG PO TABS: 60.0000 mg | ORAL_TABLET | Freq: Four times a day (QID) | ORAL | 0 refills | Status: DC | PRN

## 2017-07-17 NOTE — Evaluation (Signed)
Physical Therapy Evaluation Patient Details Name: Erin [name] MRN: [id number] DOB: 1956-01-12 Today's Date: 07/17/2017   History of Present Illness  61 yo female with onset of R BK amputation after collapse of R ankle from Charcot foot and osteomyelitis, surgery 07/15/17.  Clinical Impression  Pt is getting up to walk and was able to maneuver her walker with help to get lines and kept chair close.  Her LLE was minimally buckling at the end of the walk but controlled with all effort with min assist for total of 23' second walk and 5' first walk to chair.  Her plan is to continue gait and exercises inpt and transition to HHPT as she was already having to keep her RLE NWB prior to the surgery, used wc for mobility.    Follow Up Recommendations Home health PT;Supervision for mobility/OOB    Equipment Recommendations  None recommended by PT    Recommendations for Other Services       Precautions / Restrictions Precautions Precautions: Fall Restrictions Weight Bearing Restrictions: Yes RLE Weight Bearing: Non weight bearing      Mobility  Bed Mobility Overal bed mobility: Needs Assistance Bed Mobility: Supine to Sit     Supine to sit: Supervision;Min guard     General bed mobility comments: pt used bed rail and required extra time.  Transfers Overall transfer level: Needs assistance Equipment used: Rolling walker (2 wheeled);1 person hand held assist Transfers: Sit to/from Stand Sit to Stand: Min assist         General transfer comment: transition to LLE comfortably, able to tolerate the RLE being dependent  Ambulation/Gait Ambulation/Gait assistance: Min assist;Min guard Ambulation Distance (Feet): 28 Feet (5+23) Assistive device: Rolling walker (2 wheeled);1 person hand held assist (second person to keep chair close)   Gait velocity: reduced Gait velocity interpretation: Below normal speed for age/gender General Gait Details: pt used RW with ability to direct  it, hopping on LLE with no increased pain on RLE, vac in place  Stairs            Wheelchair Mobility    Modified Rankin (Stroke Patients Only)       Balance Overall balance assessment: Needs assistance Sitting-balance support: Single extremity supported Sitting balance-Leahy Scale: Fair     Standing balance support: Bilateral upper extremity supported;During functional activity Standing balance-Leahy Scale: Poor                               Pertinent Vitals/Pain Pain Assessment: 0-10 Pain Score: 3  Pain Location: R leg stump Pain Descriptors / Indicators: Operative site guarding Pain Intervention(s): Limited activity within patient's tolerance;Monitored during session;Premedicated before session;Repositioned;Ice applied    Home Living Family/patient expects to be discharged to:: Private residence Living Arrangements: Spouse/significant other Available Help at Discharge: Family;Available PRN/intermittently Type of Home: House Home Access: Level entry     Home Layout: Able to live on main level with bedroom/bathroom;Two level Home Equipment: Walker - 2 wheels;Cane - single point;Crutches Additional Comments: spouse works, home in the evening.     Prior Function Level of Independence: Independent         Comments: has daughter to assist where her husband cannot     Hand Dominance   Dominant Hand: Right    Extremity/Trunk Assessment   Upper Extremity Assessment Upper Extremity Assessment: Overall WFL for tasks assessed    Lower Extremity Assessment Lower Extremity Assessment: Overall WFL for tasks assessed  Cervical / Trunk Assessment Cervical / Trunk Assessment: Normal  Communication   Communication: No difficulties  Cognition Arousal/Alertness: Awake/alert Behavior During Therapy: WFL for tasks assessed/performed Overall Cognitive Status: Within Functional Limits for tasks assessed                                         General Comments General comments (skin integrity, edema, etc.): pt is wearing wound vac on RLE with no distress to unit, will take a portable unit ome with her    Exercises General Exercises - Lower Extremity Ankle Circles/Pumps: AROM;Left;5 reps Quad Sets: AROM;Both;10 reps Gluteal Sets: AROM;Both;10 reps   Assessment/Plan    PT Assessment Patient needs continued PT services  PT Problem List Decreased strength;Decreased range of motion;Decreased activity tolerance;Decreased balance;Decreased mobility;Decreased coordination;Decreased knowledge of use of DME;Cardiopulmonary status limiting activity;Obesity;Decreased skin integrity;Pain       PT Treatment Interventions DME instruction;Gait training;Functional mobility training;Therapeutic activities;Therapeutic exercise;Balance training;Neuromuscular re-education;Patient/family education    PT Goals (Current goals can be found in the Care Plan section)  Acute Rehab PT Goals Patient Stated Goal: to try to walk PT Goal Formulation: With patient/family Time For Goal Achievement: 07/31/17 Potential to Achieve Goals: Good    Frequency Min 3X/week   Barriers to discharge Decreased caregiver support will need assistance with family initially until more secure with transfers    Co-evaluation               AM-PAC PT "6 Clicks" Daily Activity  Outcome Measure Difficulty turning over in bed (including adjusting bedclothes, sheets and blankets)?: A Little Difficulty moving from lying on back to sitting on the side of the bed? : A Little Difficulty sitting down on and standing up from a chair with arms (e.g., wheelchair, bedside commode, etc,.)?: Unable Help needed moving to and from a bed to chair (including a wheelchair)?: A Little Help needed walking in hospital room?: A Little Help needed climbing 3-5 steps with a railing? : Total 6 Click Score: 14    End of Session Equipment Utilized During Treatment: Gait  belt Activity Tolerance: Patient tolerated treatment well;Patient limited by fatigue Patient left: in chair;with call bell/phone within reach;with family/visitor present Nurse Communication: Mobility status (chair alarm in place but not connected, O2 sats were 97%) PT Visit Diagnosis: Muscle weakness (generalized) (M62.81);Other abnormalities of gait and mobility (R26.89);Pain Pain - Right/Left: Right Pain - part of body: Leg    Time: 0939-1005 PT Time Calculation (min) (ACUTE ONLY): 26 min   Charges:   PT Evaluation $PT Eval Moderate Complexity: 1 Mod PT Treatments $Gait Training: 8-22 mins   PT G Codes:   PT G-Codes **NOT FOR INPATIENT CLASS** Functional Assessment Tool Used: AM-PAC 6 Clicks Basic Mobility    Ivar DrapeRuth E Aayansh Codispoti 07/17/2017, 1:02 PM   Samul Dadauth Denetria Luevanos, PT MS Acute Rehab Dept. Number: [facility] [phone] and [facility] [phone]

## 2017-07-17 NOTE — Clinical Social Work Note (Signed)
CSW received consult for "Skilled nursing facility placement." P/T and O/T recommending HHPT. RNCM aware and following for disposition. CSW signing off as no further Social Work needs identified.  Corlis HoveJeneya Jettie Lazare, LCSWA, LCASA Clinical Social Work [facility] Coverage) [phone]

## 2017-07-17 NOTE — Evaluation (Signed)
Occupational Therapy Evaluation Patient Details Name: Erin [name] MRN: [id number] DOB: [birth year] Today's Date: 07/17/2017    History of Present Illness 61 yo female with onset of R BK amputation after collapse of R ankle from Charcot foot and osteomyelitis, surgery 07/15/17.   Clinical Impression   PTA, pt was living with her husband and performing her ADLs; pt husband and daughters assisting as needed. Pt currently requiring Min A for LB ADLs and functional mobility. Educated pt on edema management, LB ADLs, and tub transfer. Recommend dc home with HHOT to optimize pt safety and independence with ADLs and functional mobility. All education provided and questions answered in preparation for dc today. All acute OT needs met and will sign off.     Follow Up Recommendations  Home health OT;Supervision/Assistance - 24 hour    Equipment Recommendations  Other (comment) (Pt and family planning to purchase tub bench once home)    Recommendations for Other Services       Precautions / Restrictions Precautions Precautions: Fall Restrictions Weight Bearing Restrictions: Yes RLE Weight Bearing: Non weight bearing      Mobility Bed Mobility Overal bed mobility: Needs Assistance Bed Mobility: Supine to Sit     Supine to sit: Supervision;Min guard     General bed mobility comments: Pt in recliner upon arrival  Transfers Overall transfer level: Needs assistance Equipment used: Rolling walker (2 wheeled);1 person hand held assist Transfers: Sit to/from Stand Sit to Stand: Min assist         General transfer comment: Min A to steady once in standing. Pt demosntrating safety sit<>Stand technique    Balance Overall balance assessment: Needs assistance Sitting-balance support: Single extremity supported Sitting balance-Leahy Scale: Fair     Standing balance support: Bilateral upper extremity supported;During functional activity Standing balance-Leahy Scale: Poor Standing  balance comment: Reliant on UE support                           ADL either performed or assessed with clinical judgement   ADL Overall ADL's : Needs assistance/impaired Eating/Feeding: Set up;Sitting   Grooming: Set up;Sitting   Upper Body Bathing: Set up;Supervision/ safety;Sitting   Lower Body Bathing: Minimal assistance;Sit to/from stand   Upper Body Dressing : Minimal assistance;Sitting Upper Body Dressing Details (indicate cue type and reason): Min A for fixing bra in back; pt reports that she is planning on purchasing sports bra. Set up A for donning shirt Lower Body Dressing: Minimal assistance;Sit to/from stand Lower Body Dressing Details (indicate cue type and reason): Pt donned pants and underwear with Min A to steady in standing. Pt able to lean forwards while long sitting in recliner to don LB clothes over legs then lateral lean to bring them up to hips/buttocks. Sit<>Stand with Min A to steady and then pull pants over hips. Educated pt on managing wound vac Toilet Transfer: Minimal assistance;Ambulation;RW (Simulated to recliner)       Tub/ Shower Transfer: Tub transfer;Ambulation;Tub bench;Rolling walker;Minimal assistance Tub/Shower Transfer Details (indicate cue type and reason): Min A to steady in standing and increase safety. Pt demonstrating good understanding of tub transfer. Educated pt and family on tub bench purchase as well as using 3N1 for home use till bench is purchased. Functional mobility during ADLs: Minimal assistance;Rolling walker General ADL Comments: Pt demonstrating good functional performance post surgery. Feel she will progress well with time and would benefit fom HHOT to increase safety and independence. Pt requiring Min A  for LB ADLs and fucntional mobility.      Vision         Perception     Praxis      Pertinent Vitals/Pain Pain Assessment: Faces Pain Score: 3  Faces Pain Scale: Hurts little more Pain Location: R leg at  residual limb Pain Descriptors / Indicators: Operative site guarding Pain Intervention(s): Monitored during session;Repositioned     Hand Dominance Right   Extremity/Trunk Assessment Upper Extremity Assessment Upper Extremity Assessment: Overall WFL for tasks assessed   Lower Extremity Assessment Lower Extremity Assessment: RLE deficits/detail RLE Deficits / Details: R BKA RLE Sensation:  (Reports phatom limb sensation/pain) RLE Coordination: decreased gross motor   Cervical / Trunk Assessment Cervical / Trunk Assessment: Normal   Communication Communication Communication: No difficulties   Cognition Arousal/Alertness: Awake/alert Behavior During Therapy: WFL for tasks assessed/performed Overall Cognitive Status: Within Functional Limits for tasks assessed                                     General Comments  Wound vac for RLE.    Exercises Exercises: Other exercises General Exercises - Lower Extremity Ankle Circles/Pumps: AROM;Left;5 reps Quad Sets: AROM;Both;10 reps Gluteal Sets: AROM;Both;10 reps Other Exercises Other Exercises: Educated pt on importance of amputee excercises including hip extension. Provided education and discription of hip extension for home.  Other Exercises: Educated pt on sensory techniques to decrease phantom limb sensation/pain once residual limb heals   Shoulder Instructions      Home Living Family/patient expects to be discharged to:: Private residence Living Arrangements: Spouse/significant other Available Help at Discharge: Family;Available PRN/intermittently Type of Home: House Home Access: Level entry     Home Layout: Able to live on main level with bedroom/bathroom;Two level     Bathroom Shower/Tub: Tub/shower unit;Curtain   Biochemist, clinical: Standard     Home Equipment: Environmental consultant - 2 wheels;Cane - single point;Crutches;Bedside commode;Wheelchair - manual   Additional Comments: spouse works, home in the evening.  w/c barrowed and old      Prior Functioning/Environment Level of Independence: Independent        Comments: Pt performed her ADLs. Daughter and husband assist as needed        OT Problem List: Decreased activity tolerance;Impaired balance (sitting and/or standing);Decreased knowledge of use of DME or AE;Decreased knowledge of precautions;Pain      OT Treatment/Interventions:      OT Goals(Current goals can be found in the care plan section) Acute Rehab OT Goals Patient Stated Goal: to try to walk OT Goal Formulation: With patient Time For Goal Achievement: 07/31/17 Potential to Achieve Goals: Good  OT Frequency:     Barriers to D/C:            Co-evaluation              AM-PAC PT "6 Clicks" Daily Activity     Outcome Measure Help from another person eating meals?: None Help from another person taking care of personal grooming?: None Help from another person toileting, which includes using toliet, bedpan, or urinal?: A Little Help from another person bathing (including washing, rinsing, drying)?: A Lot Help from another person to put on and taking off regular upper body clothing?: None Help from another person to put on and taking off regular lower body clothing?: A Little 6 Click Score: 20   End of Session Equipment Utilized During Treatment: Gait belt;Rolling walker;Other (comment) (  Wound vac) Nurse Communication: Mobility status;Precautions  Activity Tolerance: Patient tolerated treatment well Patient left: in chair;with call bell/phone within reach;with family/visitor present  OT Visit Diagnosis: Unsteadiness on feet (R26.81);Muscle weakness (generalized) (M62.81);Other abnormalities of gait and mobility (R26.89);Pain Pain - Right/Left: Right Pain - part of body: Leg                Time: 7639-4320 OT Time Calculation (min): 32 min Charges:  OT General Charges $OT Visit: 1 Visit OT Evaluation $OT Eval Low Complexity: 1 Low OT Treatments $Self  Care/Home Management : 8-22 mins G-Codes:     Nevae Pinnix MSOT, OTR/L Acute Rehab Pager: [phone] Office: [place] 07/17/2017, 2:05 PM

## 2017-07-17 NOTE — Care Management Note (Signed)
Case Management Note  Patient Details  Name: Newt MinionCarol F Asbridge MRN: [id number] Date of Birth: 10/21/1956  Subjective/Objective:  Right BKA                  Action/Plan: Discharge Planning: [facility] with new referral. Pt states husband at home to assist with care. Has RW, wheelchair, and bedside commode at home.   PCP Samuel JesterBUTLER, CYNTHIA MD   Expected Discharge Date:  07/17/17               Expected Discharge Plan:  Home w Home Health Services  In-House Referral:  NA  Discharge planning Services  CM Consult  Post Acute Care Choice:  Home Health Choice offered to:  Patient  DME Arranged:  N/A DME Agency:  NA  HH Arranged:  RN, PT, OT HH Agency:  [facility]  Status of Service:  Completed, signed off  If discussed at [employer] Length of Stay Meetings, dates discussed:    Additional Comments:  Elliot CousinShavis, Rosene Pilling Ellen, RN 07/17/2017, 2:58 PM

## 2017-07-17 NOTE — Progress Notes (Signed)
Pt ready for discharge. [facility], no S/S of leak or blockage. All education/instructions reviewed with pt and husband, and all questions/concerns addressed. IV removed, prescriptions given, and belongings gathered. Pt will be transported out via wheelchair to husband's vehicle

## 2017-07-17 NOTE — Discharge Summary (Signed)
Discharge Diagnoses:  Active Problems:   Amputation of right lower extremity below knee [facility](HCC)   Surgeries: Procedure(s): RIGHT BELOW KNEE AMPUTATION on 07/15/2017    Consultants:   Discharged Condition: Improved  Hospital Course: Erin [name] is an 61 y.o. female who was admitted 07/15/2017 with a chief complaint of Charcot collapse with osteomyelitis right hindfoot, with a final diagnosis of Charoct Collapse and Osteomyelitis Right Ankle.  Patient was brought to the operating room on 07/15/2017 and underwent Procedure(s): RIGHT BELOW KNEE AMPUTATION.    Patient was given perioperative antibiotics: Anti-infectives    Start     Dose/Rate Route Frequency Ordered Stop   07/15/17 1930  ceFAZolin (ANCEF) IVPB 2g/100 mL premix     2 g 200 mL/hr over 30 Minutes Intravenous Every 6 hours 07/15/17 1611 07/16/17 0856   07/15/17 0957  ceFAZolin (ANCEF) IVPB 2g/100 mL premix     2 g 200 mL/hr over 30 Minutes Intravenous On call to O.R. 07/15/17 0957 07/15/17 1336    .  Patient was given sequential compression devices, early ambulation, and aspirin for DVT prophylaxis.  Recent vital signs: Patient Vitals for the past 24 hrs:  BP Temp Temp src Pulse Resp SpO2  07/17/17 0300 (!) 116/50 98 F (36.7 C) Oral 68 16 97 %  07/16/17 1900 (!) 130/56 99.5 F (37.5 C) Oral 72 16 96 %  07/16/17 1500 (!) 115/55 98.7 F (37.1 C) Oral 76 16 100 %  .  Recent laboratory studies: No results found.  Discharge Medications:   Allergies as of 07/17/2017   No Known Allergies     Medication List    TAKE these medications   Albuterol Sulfate 108 (90 Base) MCG/ACT Aepb Inhale 2 puffs into the lungs every 6 (six) hours as needed (for wheezing).   allopurinol 100 MG tablet Commonly known as:  ZYLOPRIM Take 1 tablet (100 mg total) by mouth 2 (two) times daily. Take 2 tablets by mouth 2 times daily.   benazepril-hydrochlorthiazide 20-25 MG tablet Commonly known as:  LOTENSIN HCT Take 1 tablet by mouth  daily.   colchicine 0.6 MG tablet Take 1 tablet (0.6 mg total) by mouth 2 (two) times daily. Twice daily until gout flare resolves What changed:  when to take this  reasons to take this  additional instructions   ferrous sulfate 325 (65 FE) MG tablet Take 325 mg by mouth daily with breakfast.   furosemide 40 MG tablet Commonly known as:  LASIX Take 40 mg by mouth daily.   gabapentin 300 MG capsule Commonly known as:  NEURONTIN Take 3 capsules (900 mg total) by mouth 3 (three) times daily. What changed:  how much to take  when to take this  additional instructions   ibuprofen 200 MG tablet Commonly known as:  ADVIL,MOTRIN Take 200 mg by mouth every 6 (six) hours as needed.   levothyroxine 50 MCG tablet Commonly known as:  SYNTHROID, LEVOTHROID Take 50 mcg by mouth daily before breakfast.   metformin 500 MG (OSM) 24 hr tablet Commonly known as:  FORTAMET Take 500 mg by mouth daily with breakfast.   methocarbamol 500 MG tablet Commonly known as:  ROBAXIN Take 1 tablet (500 mg total) by mouth every 8 (eight) hours as needed for muscle spasms.   omeprazole 40 MG capsule Commonly known as:  PRILOSEC Take 40 mg by mouth daily.   oxycodone 30 MG immediate release tablet Commonly known as:  ROXICODONE Take 2 tablets (60 mg total) by mouth every  6 (six) hours as needed for severe pain. What changed:  You were already taking a medication with the same name, and this prescription was added. Make sure you understand how and when to take each.   oxycodone 30 MG immediate release tablet Commonly known as:  ROXICODONE Take 60 mg by mouth every 4 (four) hours as needed for pain. What changed:  Another medication with the same name was added. Make sure you understand how and when to take each.   SUMAtriptan 100 MG tablet Commonly known as:  IMITREX Take 100 mg by mouth every 2 (two) hours as needed for migraine.            Discharge Care Instructions         Start     Ordered   07/17/17 0000  oxyCODONE (ROXICODONE) 30 MG immediate release tablet  Every 6 hours PRN     07/17/17 1057   07/17/17 0000  Call MD / Call 911    Comments:  If you experience chest pain or shortness of breath, CALL 911 and be transported to the hospital emergency room.  If you develope a fever above 101 F, pus (white drainage) or increased drainage or redness at the wound, or calf pain, call your surgeon's office.   07/17/17 1057   07/17/17 0000  Diet - low sodium heart healthy     07/17/17 1057   07/17/17 0000  Constipation Prevention    Comments:  Drink plenty of fluids.  Prune juice may be helpful.  You may use a stool softener, such as Colace (over the counter) 100 mg twice a day.  Use MiraLax (over the counter) for constipation as needed.   07/17/17 1057   07/17/17 0000  Increase activity slowly as tolerated     07/17/17 1057   07/17/17 0000  methocarbamol (ROBAXIN) 500 MG tablet  Every 8 hours PRN     07/17/17 1057   07/17/17 0000  Negative Pressure Wound Therapy - Incisional     07/17/17 1057      Diagnostic Studies: Xr Ankle Complete Right  Result Date: 07/13/2017 Two-view radiographs of the right ankle shows Charcot collapse with dislocation of the tibial talar joint with bony fragments laterally and complete displacement of the talus and medial malleolus.  Xr Foot Complete Right  Result Date: 07/13/2017 Two-view radiographs of the right foot show displaced talar neck Charcot fracture with fracture and displacement the medial malleolus dislocated ankle with avulsion fractures off the distal fibula. There are stable fractures through the midfoot.  Xr Os Calcis Right  Result Date: 06/20/2017 Radiographs of right heel show the heel ulcer with silver nitrate probing. Does not probe to bone. No bony changes consistent with osteomyelitis.   Patient benefited maximally from their hospital stay and there were no complications.     Disposition: 01-Home or Self  Care Discharge Instructions    Call MD / Call 911    Complete by:  As directed    If you experience chest pain or shortness of breath, CALL 911 and be transported to the hospital emergency room.  If you develope a fever above 101 F, pus (white drainage) or increased drainage or redness at the wound, or calf pain, call your surgeon's office.   Constipation Prevention    Complete by:  As directed    Drink plenty of fluids.  Prune juice may be helpful.  You may use a stool softener, such as Colace (over the counter) 100 mg twice  a day.  Use MiraLax (over the counter) for constipation as needed.   Diet - low sodium heart healthy    Complete by:  As directed    Increase activity slowly as tolerated    Complete by:  As directed    Negative Pressure Wound Therapy - Incisional    Complete by:  As directed    Discontinue the hospital wound VAC pump and attached to the Prevena portable wound VAC pump. Patient will have this changed when she returns to the office in 1 week.     Follow-up Information    Nadara Mustard, MD Follow up in 1 week(s).   Specialty:  Orthopedic Surgery Contact information: [address] [place] [address] [phone]            Signed: Nadara Mustard 07/17/2017, 10:57 AM

## 2017-07-17 NOTE — Plan of Care (Signed)
Problem: Pain Managment: Goal: General experience of comfort will improve Patient voices understanding of pain scale and calls for medication when needed.   

## 2017-07-21 ENCOUNTER — Ambulatory Visit (INDEPENDENT_AMBULATORY_CARE_PROVIDER_SITE_OTHER): Payer: BLUE CROSS/BLUE SHIELD | Admitting: Family

## 2017-07-21 ENCOUNTER — Other Ambulatory Visit: Payer: Self-pay

## 2017-07-21 DIAGNOSIS — G4734 Idiopathic sleep related nonobstructive alveolar hypoventilation: Secondary | ICD-10-CM

## 2017-07-21 NOTE — Telephone Encounter (Signed)
Advised pt and I place order for pt for nocturnal oxygen.

## 2017-07-22 ENCOUNTER — Encounter (INDEPENDENT_AMBULATORY_CARE_PROVIDER_SITE_OTHER): Payer: Self-pay | Admitting: Family

## 2017-07-22 ENCOUNTER — Ambulatory Visit (INDEPENDENT_AMBULATORY_CARE_PROVIDER_SITE_OTHER): Payer: BLUE CROSS/BLUE SHIELD | Admitting: Family

## 2017-07-22 DIAGNOSIS — S88111A Complete traumatic amputation at level between knee and ankle, right lower leg, initial encounter: Secondary | ICD-10-CM

## 2017-07-22 DIAGNOSIS — Z89511 Acquired absence of right leg below knee: Secondary | ICD-10-CM

## 2017-07-25 ENCOUNTER — Telehealth (INDEPENDENT_AMBULATORY_CARE_PROVIDER_SITE_OTHER): Payer: Self-pay | Admitting: *Deleted

## 2017-07-25 NOTE — Telephone Encounter (Signed)
I  Called and left vm for Erin [name] advised that adaptic, or telfa pad can be placed over raw area, something nonadhering. If area looks like it is infected, have her come in the office for evaluation.

## 2017-07-25 NOTE — Progress Notes (Signed)
Post-Op Visit Note   Patient: Erin [name]           Date of Birth: 12-18-1955           MRN: [id number] Visit Date: 07/22/2017 PCP: Erin [name], Cynthia, DO  Chief Complaint:  Chief Complaint  Patient presents with  . Right Leg - Routine Post Op    HPI:  HPI The patient is a 61 year old woman who is 1 week status post right below the knee amputation overall she feels well. Ortho Exam Incision well approximated there is no drainage no erythema moderate swelling to the residual limb no sign of infection.  Visit Diagnoses:  1. Amputation of right lower extremity below knee (HCC)     Plan: begin daily Dial soap cleansing. Apply dry dressing. Wrapped with Ace wrap. Did provide an order for her prosthesis. If she is able to get her shrinker she'll begin wearing this daily.  Follow-Up Instructions: Return in about 2 weeks (around 08/05/2017).   Imaging: No results found.  Orders:  No orders of the defined types were placed in this encounter.  No orders of the defined types were placed in this encounter.    PMFS History: Patient Active Problem List   Diagnosis Date Noted  . 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 Acute osteomyelitis of right calcaneus (HCC) 07/13/2017  . Charcot foot due to diabetes mellitus (HCC) 07/13/2017  . OSA (obstructive sleep apnea) 05/24/2017  . Sleep related hypoxia 05/24/2017  . Unilateral primary osteoarthritis, left knee 05/09/2017  . Pulmonary hypertension, low resistance (HCC) 04/06/2017  . At risk for obstructive sleep apnea 04/06/2017  . Idiopathic chronic venous hypertension of both lower extremities with inflammation 03/21/2017  . 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 Influenza with respiratory manifestation 12/28/2016  . Acute kidney injury (HCC) 12/28/2016  . CKD (chronic kidney disease) stage 3, GFR 30-59 ml/min 12/28/2016  . Hypoxia 12/27/2016  . Idiopathic chronic gout  of right ankle without tophus 11/23/2016  . 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 Essential hypertension 07/06/2015  . Fibromyalgia 07/06/2015  . Dyspnea 07/04/2015  . Diabetic neuropathy (HCC) 12/10/2014   Past Medical History:  Diagnosis Date  . Arthritis    Osteoarthritis right calcaneous  . Carpal tunnel syndrome   . Charcot ankle, right   . Chronic kidney disease    patient unaware- does not see a nephrologist  . Chronic pain   . Complication of anesthesia   . Depression   . Diabetes mellitus    Tyoe II  . Diabetic neuropathy (HCC)   . Fibromyalgia   . GERD (gastroesophageal reflux disease)   . Headache   . Hernia   . History of blood transfusion    after miscarriage  . Hyperlipemia   . Hypertension   . Miscarriage    twins  . Peripheral neuropathy   . PONV (postoperative nausea and vomiting)    no problem with medication  . TMJ syndrome     Family History  Problem Relation Age of Onset  . Aneurysm Father        Deceased, 5282  . CAD Father   . Heart disease Father   . Cancer Father   . Pancreatic cancer Mother        Deceased, 6167    Past Surgical History:  Procedure Laterality Date  . ABDOMINAL SURGERY    . AMPUTATION Right 07/15/2017   Procedure: RIGHT  BELOW KNEE AMPUTATION;  Surgeon: Nadara Mustard, MD;  Location: [facility];  Service: Orthopedics;  Laterality: Right;  . BUNIONECTOMY Bilateral    2 separate surgies  . COLONOSCOPY    . PARTIAL HYSTERECTOMY  1983  . TMJ ARTHROPLASTY     left side x 2  . TONSILLECTOMY    . TUBAL LIGATION  1983  . Umbilical hernia     x2  . VAGINAL HYSTERECTOMY  2002   Social History   Occupational History  . disabled    Social History Main Topics  . Smoking status: Former Smoker    Packs/day: 1.00    Years: 32.00    Types: Cigarettes    Quit date: 11/15/2008  . Smokeless tobacco: Never Used  . Alcohol use No  . Drug use: No  . Sexual activity: Not on file

## 2017-07-25 NOTE — Telephone Encounter (Signed)
[facility] pt has raw area of R extremity and dressing is pulling skin off. Wants to know what your recommendations are as to whether she needs to come in or change to something else.   Please call pt back at [phone] or PT [phone]

## 2017-07-26 ENCOUNTER — Telehealth (INDEPENDENT_AMBULATORY_CARE_PROVIDER_SITE_OTHER): Payer: Self-pay | Admitting: Radiology

## 2017-07-26 NOTE — Telephone Encounter (Signed)
Erin [name] needs verbal auth for Home Health Occ Therapy for 2x per week for 2 weeks. Please call her back @ [phone]

## 2017-07-27 NOTE — Telephone Encounter (Signed)
I called yesterday to give verbal order for OT.

## 2017-07-28 ENCOUNTER — Telehealth (INDEPENDENT_AMBULATORY_CARE_PROVIDER_SITE_OTHER): Payer: Self-pay

## 2017-07-28 NOTE — Telephone Encounter (Signed)
done

## 2017-07-28 NOTE — Telephone Encounter (Signed)
Mallory with [facility] would like  an order faxed for patient to have a wheelchair with elevated foot rest.  Fax# is [phone] Attn: Matthias HughsKecia.  CB# is [phone].  Please advise.

## 2017-07-29 ENCOUNTER — Inpatient Hospital Stay (INDEPENDENT_AMBULATORY_CARE_PROVIDER_SITE_OTHER): Payer: BLUE CROSS/BLUE SHIELD | Admitting: Family

## 2017-08-02 ENCOUNTER — Encounter: Payer: Self-pay | Admitting: Gastroenterology

## 2017-08-02 ENCOUNTER — Telehealth: Payer: Self-pay | Admitting: Pulmonary Disease

## 2017-08-02 ENCOUNTER — Telehealth (INDEPENDENT_AMBULATORY_CARE_PROVIDER_SITE_OTHER): Payer: Self-pay | Admitting: Radiology

## 2017-08-02 ENCOUNTER — Telehealth (INDEPENDENT_AMBULATORY_CARE_PROVIDER_SITE_OTHER): Payer: Self-pay | Admitting: Orthopedic Surgery

## 2017-08-02 NOTE — Telephone Encounter (Signed)
Would like request by the end of the day  Please fax paperwork [phone]

## 2017-08-02 NOTE — Telephone Encounter (Signed)
Erin [name] is returning your call.  Please call her back when you have a chance.

## 2017-08-02 NOTE — Telephone Encounter (Signed)
Spoke with pt, she advised me that she had this procedure 2 weeks ago. She states she is doing well and did not make another appt because she doesn't know when she will be able to get in. She wanted to ask if she still needed to start on oxygen at night since she had to cancel her sleep study and her appointment with you? Please advise VS. FYI

## 2017-08-02 NOTE — Telephone Encounter (Signed)
I called and left voicemail for patient advising her to let us please know what paperwork she is talking about. Is it a disability form? Is this for her wheelchair, this was faxed last week? Advised her to call back the main number and let us know what exactly she needs.

## 2017-08-03 NOTE — Telephone Encounter (Signed)
My understanding is that she can't get insurance approval for home oxygen therapy unless she has sleep study.  Please confirm this.

## 2017-08-03 NOTE — Telephone Encounter (Signed)
PCCs, do you all know anything about her needing a sleep study for her o2? Please advise.

## 2017-08-03 NOTE — Telephone Encounter (Signed)
Look at 06/30/17 phone note it discusses the sleep study needed

## 2017-08-03 NOTE — Telephone Encounter (Signed)
Faxed again wheelchair prescription to [facility] [phone]

## 2017-08-04 NOTE — Telephone Encounter (Signed)
Spoke with pt. Made her aware that O2 can't be started unless she has sleep study. She verbalized understanding. Nothing further was needed.

## 2017-08-08 ENCOUNTER — Ambulatory Visit (INDEPENDENT_AMBULATORY_CARE_PROVIDER_SITE_OTHER): Payer: BLUE CROSS/BLUE SHIELD | Admitting: Orthopedic Surgery

## 2017-08-08 ENCOUNTER — Ambulatory Visit: Payer: BLUE CROSS/BLUE SHIELD | Admitting: Pulmonary Disease

## 2017-08-08 DIAGNOSIS — E11621 Type 2 diabetes mellitus with foot ulcer: Secondary | ICD-10-CM | POA: Insufficient documentation

## 2017-08-08 DIAGNOSIS — Z89511 Acquired absence of right leg below knee: Secondary | ICD-10-CM

## 2017-08-08 DIAGNOSIS — S88111A Complete traumatic amputation at level between knee and ankle, right lower leg, initial encounter: Secondary | ICD-10-CM

## 2017-08-08 DIAGNOSIS — L97521 Non-pressure chronic ulcer of other part of left foot limited to breakdown of skin: Secondary | ICD-10-CM

## 2017-08-08 NOTE — Progress Notes (Signed)
Office Visit Note   Patient: Erin [name]           Date of Birth: 08/12/1956           MRN: [id number] Visit Date: 08/08/2017              Requested by: Samuel Jester, DO [address] [place], [place] [address] PCP: Samuel Jester, DO  No chief complaint on file.     HPI: Patient is a 61 year old woman with diabetic insensate neuropathy status post right transtibial amputation 3 weeks out with new ulcers over the fourth and fifth toes left foot due to her diabetic insensate neuropathy and pressure from her shoe wear.  Assessment & Plan: Visit Diagnoses:  1. 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 Diabetic ulcer of toe of left foot associated with type 2 diabetes mellitus, limited to breakdown of skin (HCC)     Plan: Recommend that she cut out the top of her shoe in the interim to unload pressure from the fourth and fifth toes. Patient was given a prescription for [facility] for an extra-depth shoe for the left foot and custom orthotic due to her diabetic insensate neuropathy with Wagner grade 1 ulcer. We will harvest the staples today for the right transtibial amputation she currently has a 3 extra large shrinker she will need a 2 extra large shrinker.  Follow-Up Instructions: Return in about 3 weeks (around 08/29/2017).   Ortho Exam  Patient is alert, oriented, no adenopathy, well-dressed, normal affect, normal respiratory effort. Examination patient has developed pressure ulcers over the dorsum of the left foot fourth and fifth toes secondary to his shoe wear. Examination the residual limb is healing well there is no cellulitis no drainage no odor no signs of infection. She does have dry cracked skin recommended moisturizing lotion for both lower extremities.  Imaging: No results found. No images are attached to the encounter.  Labs: Lab Results  Component Value Date   HGBA1C 5.8 (H) 07/15/2017   HGBA1C 6.1 (H) 12/28/2016   HGBA1C 6.0 (H) 07/04/2015    ESRSEDRATE 90 (H) 07/05/2015   ESRSEDRATE 45 (H) 12/10/2014   LABURIC 8.4 (H) 02/16/2017   LABURIC 6.5 11/23/2016   LABURIC 8.3 (H) 11/02/2016    Orders:  No orders of the defined types were placed in this encounter.  No orders of the defined types were placed in this encounter.    Procedures: No procedures performed  Clinical Data: No additional findings.  ROS:  All other systems negative, except as noted in the HPI. Review of Systems  Objective: Vital Signs: There were no vitals taken for this visit.  Specialty Comments:  No specialty comments available.  PMFS History: Patient Active Problem List   Diagnosis Date Noted  . Diabetic ulcer of toe of left foot associated with type 2 diabetes mellitus, limited to breakdown of skin (HCC) 08/08/2017  . 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 Acute osteomyelitis of right calcaneus (HCC) 07/13/2017  . Charcot foot due to diabetes mellitus (HCC) 07/13/2017  . OSA (obstructive sleep apnea) 05/24/2017  . Sleep related hypoxia 05/24/2017  . Unilateral primary osteoarthritis, left knee 05/09/2017  . Pulmonary hypertension, low resistance (HCC) 04/06/2017  . At risk for obstructive sleep apnea 04/06/2017  . Idiopathic chronic venous hypertension of both lower extremities with inflammation 03/21/2017  . 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 Influenza with respiratory manifestation  12/28/2016  . Acute kidney injury (HCC) 12/28/2016  . CKD (chronic kidney disease) stage 3, GFR 30-59 ml/min 12/28/2016  . Hypoxia 12/27/2016  . Idiopathic chronic gout of right ankle without tophus 11/23/2016  . 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 Essential hypertension 07/06/2015  . Fibromyalgia 07/06/2015  . Dyspnea 07/04/2015  . Diabetic neuropathy (HCC) 12/10/2014   Past Medical History:  Diagnosis Date  . Arthritis    Osteoarthritis  right calcaneous  . Carpal tunnel syndrome   . Charcot ankle, right   . Chronic kidney disease    patient unaware- does not see a nephrologist  . Chronic pain   . Complication of anesthesia   . Depression   . Diabetes mellitus    Tyoe II  . Diabetic neuropathy (HCC)   . Fibromyalgia   . GERD (gastroesophageal reflux disease)   . Headache   . Hernia   . History of blood transfusion    after miscarriage  . Hyperlipemia   . Hypertension   . Miscarriage    twins  . Peripheral neuropathy   . PONV (postoperative nausea and vomiting)    no problem with medication  . TMJ syndrome     Family History  Problem Relation Age of Onset  . Aneurysm Father        Deceased, 30  . CAD Father   . Heart disease Father   . Cancer Father   . Pancreatic cancer Mother        Deceased, 72    Past Surgical History:  Procedure Laterality Date  . ABDOMINAL SURGERY    . AMPUTATION Right 07/15/2017   Procedure: RIGHT BELOW KNEE AMPUTATION;  Surgeon: Nadara Mustard, MD;  Location: [facility] OR;  Service: Orthopedics;  Laterality: Right;  . BUNIONECTOMY Bilateral    2 separate surgies  . COLONOSCOPY    . PARTIAL HYSTERECTOMY  1983  . TMJ ARTHROPLASTY     left side x 2  . TONSILLECTOMY    . TUBAL LIGATION  1983  . Umbilical hernia     x2  . VAGINAL HYSTERECTOMY  2002   Social History   Occupational History  . disabled    Social History Main Topics  . Smoking status: Former Smoker    Packs/day: 1.00    Years: 32.00    Types: Cigarettes    Quit date: 11/15/2008  . Smokeless tobacco: Never Used  . Alcohol use No  . Drug use: No  . Sexual activity: Not on file

## 2017-08-09 ENCOUNTER — Ambulatory Visit: Payer: BLUE CROSS/BLUE SHIELD | Admitting: Pulmonary Disease

## 2017-08-11 ENCOUNTER — Telehealth (INDEPENDENT_AMBULATORY_CARE_PROVIDER_SITE_OTHER): Payer: Self-pay | Admitting: Orthopedic Surgery

## 2017-08-11 NOTE — Telephone Encounter (Signed)
Marlorie (OT) with [facility] called needing orders to extend visit for 1 more week. The number  to contact patient is [phone]

## 2017-08-11 NOTE — Telephone Encounter (Signed)
I called to give verbal order on voicemail.

## 2017-08-26 ENCOUNTER — Encounter (HOSPITAL_BASED_OUTPATIENT_CLINIC_OR_DEPARTMENT_OTHER): Payer: BLUE CROSS/BLUE SHIELD

## 2017-08-29 ENCOUNTER — Ambulatory Visit (INDEPENDENT_AMBULATORY_CARE_PROVIDER_SITE_OTHER): Payer: BLUE CROSS/BLUE SHIELD | Admitting: Orthopedic Surgery

## 2017-08-29 ENCOUNTER — Encounter (INDEPENDENT_AMBULATORY_CARE_PROVIDER_SITE_OTHER): Payer: Self-pay | Admitting: Orthopedic Surgery

## 2017-08-29 DIAGNOSIS — Z89511 Acquired absence of right leg below knee: Secondary | ICD-10-CM

## 2017-08-29 DIAGNOSIS — S88111A Complete traumatic amputation at level between knee and ankle, right lower leg, initial encounter: Secondary | ICD-10-CM

## 2017-08-29 NOTE — Progress Notes (Signed)
Post-Op Visit Note   Patient: Erin [name]           Date of Birth: 23-Jul-1956           MRN: [id number] Visit Date: 08/29/2017 PCP: Samuel Jester, DO  Chief Complaint:  Chief Complaint  Patient presents with  . Right Knee - Follow-up    HPI:  HPI The patient is a 61 year old woman seen today status post right below knee amputation on 07/15/17. Overall doing well. States has appointment for casting this Friday.  Ortho Exam Incision overall well healed. Medially has 3 mm in diameter open area, there is exposed suture. This was harvested. Bloody drainage. No erythema or odor. No sign of infection. Is consolidating well.  Visit Diagnoses:  1. 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 Continue shrinker daily. Call with any concerns.  Follow-Up Instructions: Return in about 2 months (around 10/29/2017).   Imaging: No results found.  Orders:  No orders of the defined types were placed in this encounter.  No orders of the defined types were placed in this encounter.    PMFS History: Patient Active Problem List   Diagnosis Date Noted  . Diabetic ulcer of toe of left foot associated with type 2 diabetes mellitus, limited to breakdown of skin (HCC) 08/08/2017  . 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 Charcot foot due to diabetes mellitus (HCC) 07/13/2017  . OSA (obstructive sleep apnea) 05/24/2017  . Sleep related hypoxia 05/24/2017  . Unilateral primary osteoarthritis, left knee 05/09/2017  . Pulmonary hypertension, low resistance (HCC) 04/06/2017  . At risk for obstructive sleep apnea 04/06/2017  . Idiopathic chronic venous hypertension of both lower extremities with inflammation 03/21/2017  . 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 Influenza with respiratory manifestation 12/28/2016  . Acute kidney injury (HCC) 12/28/2016  . CKD (chronic kidney  disease) stage 3, GFR 30-59 ml/min (HCC) 12/28/2016  . Hypoxia 12/27/2016  . 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 Essential hypertension 07/06/2015  . Fibromyalgia 07/06/2015  . Dyspnea 07/04/2015  . Diabetic neuropathy (HCC) 12/10/2014   Past Medical History:  Diagnosis Date  . Arthritis    Osteoarthritis right calcaneous  . Carpal tunnel syndrome   . Charcot ankle, right   . Chronic kidney disease    patient unaware- does not see a nephrologist  . Chronic pain   . Complication of anesthesia   . Depression   . Diabetes mellitus    Tyoe II  . Diabetic neuropathy (HCC)   . Fibromyalgia   . GERD (gastroesophageal reflux disease)   . Headache   . Hernia   . History of blood transfusion    after miscarriage  . Hyperlipemia   . Hypertension   . Miscarriage    twins  . Peripheral neuropathy   . PONV (postoperative nausea and vomiting)    no problem with medication  . TMJ syndrome     Family History  Problem Relation Age of Onset  . Aneurysm Father        Deceased, 67  . CAD Father   . Heart disease Father   . Cancer Father   . Pancreatic cancer Mother        Deceased, 4    Past Surgical History:  Procedure Laterality Date  . ABDOMINAL SURGERY    . AMPUTATION Right 07/15/2017  Procedure: RIGHT BELOW KNEE AMPUTATION;  Surgeon: Nadara Mustard, MD;  Location: [facility] OR;  Service: Orthopedics;  Laterality: Right;  . BUNIONECTOMY Bilateral    2 separate surgies  . COLONOSCOPY    . PARTIAL HYSTERECTOMY  1983  . TMJ ARTHROPLASTY     left side x 2  . TONSILLECTOMY    . TUBAL LIGATION  1983  . Umbilical hernia     x2  . VAGINAL HYSTERECTOMY  2002   Social History   Occupational History  . disabled    Social History Main Topics  . Smoking status: Former Smoker    Packs/day: 1.00    Years: 32.00    Types: Cigarettes    Quit date: 11/15/2008  . Smokeless tobacco: Never Used  . Alcohol use No  . Drug use: No  . Sexual  activity: Not on file

## 2017-09-20 ENCOUNTER — Other Ambulatory Visit: Payer: BLUE CROSS/BLUE SHIELD

## 2017-09-22 ENCOUNTER — Ambulatory Visit: Payer: BLUE CROSS/BLUE SHIELD | Admitting: Gastroenterology

## 2017-10-05 ENCOUNTER — Telehealth (INDEPENDENT_AMBULATORY_CARE_PROVIDER_SITE_OTHER): Payer: Self-pay | Admitting: Radiology

## 2017-10-05 DIAGNOSIS — R269 Unspecified abnormalities of gait and mobility: Secondary | ICD-10-CM

## 2017-10-05 DIAGNOSIS — Z89511 Acquired absence of right leg below knee: Secondary | ICD-10-CM

## 2017-10-05 NOTE — Telephone Encounter (Signed)
Referral entered to [facility] for gait training per patient and [facility] request.

## 2017-10-10 ENCOUNTER — Telehealth (INDEPENDENT_AMBULATORY_CARE_PROVIDER_SITE_OTHER): Payer: Self-pay | Admitting: Family

## 2017-10-10 NOTE — Telephone Encounter (Signed)
OV Notes 06/2017- present faxed to [facility] [phone]

## 2017-10-31 ENCOUNTER — Encounter: Payer: Self-pay | Admitting: Neurology

## 2017-10-31 ENCOUNTER — Ambulatory Visit (INDEPENDENT_AMBULATORY_CARE_PROVIDER_SITE_OTHER): Payer: BLUE CROSS/BLUE SHIELD | Admitting: Neurology

## 2017-10-31 VITALS — BP 120/70 | HR 85 | Ht 63.0 in | Wt 230.0 lb

## 2017-10-31 DIAGNOSIS — G5712 Meralgia paresthetica, left lower limb: Secondary | ICD-10-CM | POA: Diagnosis not present

## 2017-10-31 DIAGNOSIS — E0842 Diabetes mellitus due to underlying condition with diabetic polyneuropathy: Secondary | ICD-10-CM

## 2017-10-31 DIAGNOSIS — Z79899 Other long term (current) drug therapy: Secondary | ICD-10-CM | POA: Diagnosis not present

## 2017-10-31 MED ORDER — NORTRIPTYLINE HCL 10 MG PO CAPS: ORAL_CAPSULE | ORAL | 3 refills | Status: DC

## 2017-10-31 NOTE — Patient Instructions (Signed)
1.  Start nortriptyline 10mg  at bedtime for 2 week, then increase to 2 tablet at bedtime 2.  Continue gabapentin 900mg  three times daily  Return to clinic in 6 months

## 2017-10-31 NOTE — Progress Notes (Signed)
Follow-up Visit   Date: 10/31/17    Erin [name] MRN: [id number] DOB: Feb 04, 1956   Interim History: Erin [name] is a 61 y.o. right-handed Caucasian female with GERD, fibromyalgia, hypertension, depression, migraine, diabetes mellitus type 2, vitamin B12 deficiency, chronic pain syndrome, s/p R BKA returning to the clinic for follow-up of diabetic neuropathy.  The patient was accompanied to the clinic by self.  History of present illness: Starting around 2013, she developed sudden onset of numbness/tingling involving her right foot and within three months, she developed similar symptoms on the right. She also complains of burning sensation and intermittent stabbing pain over the arch of the feet.Discomfort is now at the level of mid-calf and involves her ankles and feet. She was started on Lyrica 133m TID which seems to alleviate some of the pain. She takes oxycodone 37mevery 4-5 hours. In the fall of 2015, she developed intermittent numbness/tingling of the hands which is worse in the morning.   She walks with a cane for balance. No recent falls. 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     She also seen podiatry and a chiropractor. She was told her spine was totally out of alignment and received laser therapy to her feet for three weeks, but felt even worse so stopped going.   She established care with me in January 2016 and was tried on several different medications:  Lyrica 20053mID (effective but developed swelling), gabapentin (ineffective, weight gain); nortriptyline (ineffective, dry mouth)  UPDATE 08/23/2016:  She did not tolerate Cymbalta because it made her nauseous.  When she attempted tapering her gabapentin, she felt they her pain was worse so restarted this as she was taking 600-600-900m44mShe continues to take oxycodone 30mg34mee times daily, which she was previously taking 6 times daily.  She has some tingling in the  hands, and denies weakness, but is concerned that her neuropathy may be affecting her hands.   UPDATE 03/07/2017:  She is here for 6 month follow-up appointment.  Her neuropathy is stable and well controlled on gabapentin 600-600-900mg,69m she also takes oxycodone 30mg T74mnd unable to reduce this dose.  She also complains of numbness/tingling over the left lateral thigh, which has become more constant. She denies any weakness of the legs or recent weight gain.  Diabetes is well controlled with last HbA1c 6.1  UPDATE 10/31/2017:  She is here for 6 month follow-up visit.  She underwent R BKA on 8/31 for R foot osteomyelitis. She recovered well from her surgery and has a prosthetic leg and will be started therapy in January.  She continues to take gabapentin 900mg th62mtimes daily and does not appreciate much benefit with respect to her neuropathy. She has not suffered any falls.   Medications:  Current Outpatient Medications on File Prior to Visit  Medication Sig Dispense Refill  . Albuterol Sulfate 108 (90 Base) MCG/ACT AEPB Inhale 2 puffs into the lungs every 6 (six) hours as needed (for wheezing). 1 each 0  . allopurinol (ZYLOPRIM) 100 MG tablet Take 1 tablet (100 mg total) by mouth 2 (two) times daily. Take 2 tablets by mouth 2 times daily. 120 tablet 3  . benazepril-hydrochlorthiazide (LOTENSIN HCT) 20-25 MG per tablet Take 1 tablet by mouth daily.    . colchicine 0.6 MG tablet Take 1 tablet (0.6 mg total) by mouth 2 (two) times daily. Twice daily until gout flare resolves (Patient taking differently: Take 0.6 mg  by mouth daily as needed. ) 60 tablet 3  . ferrous sulfate 325 (65 FE) MG tablet Take 325 mg by mouth daily with breakfast.    . furosemide (LASIX) 40 MG tablet Take 40 mg by mouth daily.    Marland Kitchen gabapentin (NEURONTIN) 300 MG capsule Take 3 capsules (900 mg total) by mouth 3 (three) times daily. (Patient taking differently: Take 300-600 mg by mouth See admin instructions. Pt takes 300 mg  twice daily , and 600 mg at bedtime) 270 capsule 11  . ibuprofen (ADVIL,MOTRIN) 200 MG tablet Take 200 mg by mouth every 6 (six) hours as needed.    Marland Kitchen levothyroxine (SYNTHROID, LEVOTHROID) 50 MCG tablet Take 50 mcg by mouth daily before breakfast.   10  . metformin (FORTAMET) 500 MG (OSM) 24 hr tablet Take 500 mg by mouth daily with breakfast.    . methocarbamol (ROBAXIN) 500 MG tablet Take 1 tablet (500 mg total) by mouth every 8 (eight) hours as needed for muscle spasms. 30 tablet 0  . metoCLOPramide (REGLAN) 10 MG tablet Take 10 mg by mouth 4 (four) times daily.    Marland Kitchen omeprazole (PRILOSEC) 40 MG capsule Take 40 mg by mouth daily.    Marland Kitchen oxyCODONE (ROXICODONE) 30 MG immediate release tablet Take 2 tablets (60 mg total) by mouth every 6 (six) hours as needed for severe pain. 30 tablet 0  . SUMAtriptan (IMITREX) 100 MG tablet Take 100 mg by mouth every 2 (two) hours as needed for migraine.   9   No current facility-administered medications on file prior to visit.     Allergies: No Known Allergies  Review of Systems:  CONSTITUTIONAL: No fevers, chills, night sweats, or weight loss.  EYES: No visual changes or eye pain ENT: No hearing changes.  No history of nose bleeds.   RESPIRATORY: No cough, wheezing and shortness of breath.   CARDIOVASCULAR: Negative for chest pain, and palpitations.   GI: Negative for abdominal discomfort, blood in stools or black stools.  No recent change in bowel habits.   GU:  No history of incontinence.   MUSCLOSKELETAL: No history of joint pain or swelling.  No myalgias.   SKIN: Negative for lesions, rash, and itching.   ENDOCRINE: Negative for cold or heat intolerance, polydipsia or goiter.   PSYCH:  No depression or anxiety symptoms.   NEURO: As Above.   Vital Signs:  BP 120/70   Pulse 85   Ht _0  (1.6 m)   Wt 230 lb (104.3 kg)   SpO2 94%   BMI 40.74 kg/m   General:  Well appearing, sitting in wheelchair, R BKA  Neurological Exam: MENTAL STATUS  including orientation to time, place, person, recent and remote memory, attention span and concentration, language, and fund of knowledge is normal.  Speech is not dysarthric.  CRANIAL NERVES:  Pupils equal round and reactive to light.  Normal conjugate, extra-ocular eye movements in all directions of gaze.  No ptosis.  Face is symmetric.   MOTOR:  Motor strength is 5/5 in all extremities, R BKA    SENSORY: Vibration absent distal left ankle.  REFLEXES:  Reflexes are 2+/4 throughout, except absent at the left ankle.   COORDINATION/GAIT:  Gait not tested.  Data: Labs 12/16/2014:  ESR 45, vitamin B12 348, vitamin B6 6.5, copper 131, TSH 7.6, fT3 2.4, fT4 0.88, HbA1c 5.6  NCS/EMG of the right > left upper extremity 09/21/2016:  The electrode diagnostic testing is most consistent with a chronic sensorimotor polyneuropathy,  axon loss in type, affecting the right upper extremity. Overall, these findings are mild in degree electrically.  IMPRESSION: 1.  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  Left meralgia paresthetica, stable  3.  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  She will be starting PT in January and is followed by Dr. DudSharol GivenReturn to clinic in 6 months   Greater than 50% of this 25 minute visit was spent in counseling, explanation of diagnosis, planning of further management, and coordination of care.    Thank you for allowing me to participate in patient's care.  If I can answer any additional questions, I would be pleased to do so.    Sincerely,    Elif Yonts K. PatPosey ProntoDO

## 2017-11-02 ENCOUNTER — Encounter (INDEPENDENT_AMBULATORY_CARE_PROVIDER_SITE_OTHER): Payer: Self-pay | Admitting: Family

## 2017-11-02 ENCOUNTER — Ambulatory Visit (INDEPENDENT_AMBULATORY_CARE_PROVIDER_SITE_OTHER): Payer: BLUE CROSS/BLUE SHIELD | Admitting: Family

## 2017-11-02 DIAGNOSIS — S88111A Complete traumatic amputation at level between knee and ankle, right lower leg, initial encounter: Secondary | ICD-10-CM

## 2017-11-02 DIAGNOSIS — Z89511 Acquired absence of right leg below knee: Secondary | ICD-10-CM

## 2017-11-02 NOTE — Progress Notes (Signed)
Post-Op Visit Note   Patient: Erin [name]           Date of Birth: 05/24/1956           MRN: [id number] Visit Date: 11/02/2017 PCP: Samuel JesterButler, Cynthia, DO  Chief Complaint:  No chief complaint on file.   HPI:  HPI The patient is a 61 year old woman seen today status post right below knee amputation on 07/15/17. Overall doing well. Will begin gait training with Robin on 11/22/16.  Ortho Exam Incision overall well healed. Residual limb is well consolidated. No erythema or impending breakdown. No callus.   Visit Diagnoses:  1. 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 Call with any concerns.  Follow-Up Instructions: Return if symptoms worsen or fail to improve.   Imaging: No results found.  Orders:  No orders of the defined types were placed in this encounter.  No orders of the defined types were placed in this encounter.    PMFS History: Patient Active Problem List   Diagnosis Date Noted  . Diabetic ulcer of toe of left foot associated with type 2 diabetes mellitus, limited to breakdown of skin (HCC) 08/08/2017  . 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 Charcot foot due to diabetes mellitus (HCC) 07/13/2017  . OSA (obstructive sleep apnea) 05/24/2017  . Sleep related hypoxia 05/24/2017  . Unilateral primary osteoarthritis, left knee 05/09/2017  . Pulmonary hypertension, low resistance (HCC) 04/06/2017  . At risk for obstructive sleep apnea 04/06/2017  . Idiopathic chronic venous hypertension of both lower extremities with inflammation 03/21/2017  . 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 Influenza with respiratory manifestation 12/28/2016  . Acute kidney injury (HCC) 12/28/2016  . CKD (chronic kidney disease) stage 3, GFR 30-59 ml/min (HCC) 12/28/2016  . Hypoxia 12/27/2016  . 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  Essential hypertension 07/06/2015  . Fibromyalgia 07/06/2015  . Dyspnea 07/04/2015  . Diabetic neuropathy (HCC) 12/10/2014   Past Medical History:  Diagnosis Date  . Arthritis    Osteoarthritis right calcaneous  . Carpal tunnel syndrome   . Charcot ankle, right   . Chronic kidney disease    patient unaware- does not see a nephrologist  . Chronic pain   . Complication of anesthesia   . Depression   . Diabetes mellitus    Tyoe II  . Diabetic neuropathy (HCC)   . Fibromyalgia   . GERD (gastroesophageal reflux disease)   . Headache   . Hernia   . History of blood transfusion    after miscarriage  . Hyperlipemia   . Hypertension   . Miscarriage    twins  . Peripheral neuropathy   . PONV (postoperative nausea and vomiting)    no problem with medication  . TMJ syndrome     Family History  Problem Relation Age of Onset  . Aneurysm Father        Deceased, 182  . CAD Father   . Heart disease Father   . Cancer Father   . Pancreatic cancer Mother        Deceased, 667    Past Surgical History:  Procedure Laterality Date  . ABDOMINAL SURGERY    . AMPUTATION Right 07/15/2017   Procedure: RIGHT BELOW KNEE AMPUTATION;  Surgeon: Nadara Mustarduda, Marcus V, MD;  Location[facility] OR;  Service: Orthopedics;  Laterality: Right;  .  BUNIONECTOMY Bilateral    2 separate surgies  . COLONOSCOPY    . PARTIAL HYSTERECTOMY  1983  . TMJ ARTHROPLASTY     left side x 2  . TONSILLECTOMY    . TUBAL LIGATION  1983  . Umbilical hernia     x2  . VAGINAL HYSTERECTOMY  2002   Social History   Occupational History  . Occupation: disabled  Tobacco Use  . Smoking status: Former Smoker    Packs/day: 1.00    Years: 32.00    Pack years: 32.00    Types: Cigarettes    Last attempt to quit: 11/15/2008    Years since quitting: 8.9  . Smokeless tobacco: Never Used  Substance and Sexual Activity  . Alcohol use: No    Alcohol/week: 0.0 oz  . Drug use: No  . Sexual activity: Not on file

## 2017-11-21 ENCOUNTER — Ambulatory Visit: Payer: BLUE CROSS/BLUE SHIELD | Admitting: Physical Therapy

## 2017-12-21 ENCOUNTER — Encounter: Payer: Self-pay | Admitting: *Deleted

## 2017-12-23 ENCOUNTER — Telehealth (INDEPENDENT_AMBULATORY_CARE_PROVIDER_SITE_OTHER): Payer: Self-pay | Admitting: Orthopedic Surgery

## 2017-12-23 NOTE — Telephone Encounter (Signed)
Called pt and this is a personal policy that she needs to fill out per pt. 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 Pt states that she will call if she needs anymore information

## 2017-12-23 NOTE — Telephone Encounter (Signed)
Patient called saying that she received a letter from her insurance company asking for the exact diagnosis and the reason for her below the knee amputation. CB # [phone]

## 2018-01-31 ENCOUNTER — Ambulatory Visit (INDEPENDENT_AMBULATORY_CARE_PROVIDER_SITE_OTHER): Payer: BLUE CROSS/BLUE SHIELD | Admitting: Orthopedic Surgery

## 2018-01-31 ENCOUNTER — Encounter (INDEPENDENT_AMBULATORY_CARE_PROVIDER_SITE_OTHER): Payer: Self-pay | Admitting: Orthopedic Surgery

## 2018-01-31 VITALS — Ht 63.0 in | Wt 230.0 lb

## 2018-01-31 DIAGNOSIS — Z89511 Acquired absence of right leg below knee: Secondary | ICD-10-CM | POA: Diagnosis not present

## 2018-01-31 DIAGNOSIS — I87322 Chronic venous hypertension (idiopathic) with inflammation of left lower extremity: Secondary | ICD-10-CM

## 2018-01-31 DIAGNOSIS — S88111A Complete traumatic amputation at level between knee and ankle, right lower leg, initial encounter: Secondary | ICD-10-CM

## 2018-01-31 NOTE — Progress Notes (Signed)
Office Visit Note   Patient: Erin [name]           Date of Birth: 1955/12/30           MRN: [id number] Visit Date: 01/31/2018              Requested by: Samuel Jester, DO [address] [place], [place] [address] PCP: Samuel Jester, DO  Chief Complaint  Patient presents with  . Left Ankle - Edema      HPI: Patient is a 62 year old woman status post right transtibial amputation she states she is been having increased swelling in the left lower extremity.  Patient states that she does take a fluid pill every day she is not sure why she does not take any potassium supplements.  She states she is wearing her compression stockings day and night she is not exercising she states she elevates her leg 2 times a day.  Patient states that her sodium was low a month ago on her routine blood exam and she was told to start increasing her salt intake.  Assessment & Plan: Visit Diagnoses:  1. 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 Idiopathic chronic venous hypertension of left lower extremity with inflammation     Plan: Recommended continue with dorsiflexion stretching exercises this was demonstrated.  Recommend continue with the compression stockings.  Recommended exercise either walking stationary bicycle or elliptical to stimulate the calf pump to improve venous return.  Recommended elevation.  Recommended not taking added sodium recommended discontinuing her fluid pill and she should take approximately 2-3 ounces of coconut water a day.  Reevaluate in 4 weeks.  Follow-Up Instructions: Return in about 4 weeks (around 02/28/2018).   Ortho Exam  Patient is alert, oriented, no adenopathy, well-dressed, normal affect, normal respiratory effort. Examination patient does have an antalgic gait.  She has brawny skin color changes of the left lower extremity with pitting edema.  Her calf measures 40 cm in circumference.  She has a good pulse.  She is developing Achilles  contracture but with dorsiflexion I can get her about 10 degrees past neutral.  Patient states that her hemoglobin A1c was in the 5 range 1 month ago.  There is no calf tenderness to palpation no signs of DVT no ulcers no cellulitis.  Imaging: No results found. No images are attached to the encounter.  Labs: Lab Results  Component Value Date   HGBA1C 5.8 (H) 07/15/2017   HGBA1C 6.1 (H) 12/28/2016   HGBA1C 6.0 (H) 07/04/2015   ESRSEDRATE 90 (H) 07/05/2015   ESRSEDRATE 45 (H) 12/10/2014   LABURIC 8.4 (H) 02/16/2017   LABURIC 6.5 11/23/2016   LABURIC 8.3 (H) 11/02/2016    @LABSALLVALUES (HGBA1)@  Body mass index is 40.74 kg/m.  Orders:  No orders of the defined types were placed in this encounter.  No orders of the defined types were placed in this encounter.    Procedures: No procedures performed  Clinical Data: No additional findings.  ROS:  All other systems negative, except as noted in the HPI. Review of Systems  Objective: Vital Signs: Ht 5\' 3"  (1.6 m)   Wt 230 lb (104.3 kg)   BMI 40.74 kg/m   Specialty Comments:  No specialty comments available.  PMFS History: Patient Active Problem List   Diagnosis Date Noted  . Diabetic ulcer of toe of left foot associated with type 2 diabetes mellitus, limited to breakdown of skin (HCC) 08/08/2017  . Amputation  of right lower extremity below knee (HCC) 07/15/2017  . Charcot foot due to diabetes mellitus (HCC) 07/13/2017  . OSA (obstructive sleep apnea) 05/24/2017  . Sleep related hypoxia 05/24/2017  . Unilateral primary osteoarthritis, left knee 05/09/2017  . Pulmonary hypertension, low resistance (HCC) 04/06/2017  . At risk for obstructive sleep apnea 04/06/2017  . Idiopathic chronic venous hypertension of both lower extremities with inflammation 03/21/2017  . 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 Influenza with respiratory manifestation 12/28/2016  . Acute kidney injury (HCC)  12/28/2016  . CKD (chronic kidney disease) stage 3, GFR 30-59 ml/min (HCC) 12/28/2016  . Hypoxia 12/27/2016  . 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 Essential hypertension 07/06/2015  . Fibromyalgia 07/06/2015  . Dyspnea 07/04/2015  . Diabetic neuropathy (HCC) 12/10/2014   Past Medical History:  Diagnosis Date  . Arthritis    Osteoarthritis right calcaneous  . Carpal tunnel syndrome   . Charcot ankle, right   . Chronic kidney disease    patient unaware- does not see a nephrologist  . Chronic pain   . Complication of anesthesia   . Depression   . Diabetes mellitus    Tyoe II  . Diabetic neuropathy (HCC)   . Fibromyalgia   . GERD (gastroesophageal reflux disease)   . Headache   . Hernia   . History of blood transfusion    after miscarriage  . Hyperlipemia   . Hypertension   . Miscarriage    twins  . Peripheral neuropathy   . PONV (postoperative nausea and vomiting)    no problem with medication  . TMJ syndrome     Family History  Problem Relation Age of Onset  . Aneurysm Father        Deceased, 3582  . CAD Father   . Heart disease Father   . Cancer Father   . Pancreatic cancer Mother        Deceased, 8067    Past Surgical History:  Procedure Laterality Date  . ABDOMINAL SURGERY    . AMPUTATION Right 07/15/2017   Procedure: RIGHT BELOW KNEE AMPUTATION;  Surgeon: Nadara Mustarduda, Sanaya Gwilliam V, MD;  [facility];  Service: Orthopedics;  Laterality: Right;  . BUNIONECTOMY Bilateral    2 separate surgies  . COLONOSCOPY    . PARTIAL HYSTERECTOMY  1983  . TMJ ARTHROPLASTY     left side x 2  . TONSILLECTOMY    . TUBAL LIGATION  1983  . Umbilical hernia     x2  . VAGINAL HYSTERECTOMY  2002   Social History   Occupational History  . Occupation: disabled  Tobacco Use  . Smoking status: Former Smoker    Packs/day: 1.00    Years: 32.00    Pack years: 32.00    Types: Cigarettes    Last attempt to quit: 11/15/2008    Years since  quitting: 9.2  . Smokeless tobacco: Never Used  Substance and Sexual Activity  . Alcohol use: No    Alcohol/week: 0.0 oz  . Drug use: No  . Sexual activity: Not on file

## 2018-02-28 ENCOUNTER — Ambulatory Visit (INDEPENDENT_AMBULATORY_CARE_PROVIDER_SITE_OTHER): Payer: BLUE CROSS/BLUE SHIELD | Admitting: Orthopedic Surgery

## 2018-05-08 ENCOUNTER — Ambulatory Visit (INDEPENDENT_AMBULATORY_CARE_PROVIDER_SITE_OTHER): Payer: BLUE CROSS/BLUE SHIELD | Admitting: Neurology

## 2018-05-08 ENCOUNTER — Encounter: Payer: Self-pay | Admitting: Neurology

## 2018-05-08 VITALS — BP 104/68 | HR 88 | Ht 63.0 in | Wt 236.2 lb

## 2018-05-08 DIAGNOSIS — Z79899 Other long term (current) drug therapy: Secondary | ICD-10-CM

## 2018-05-08 DIAGNOSIS — E0842 Diabetes mellitus due to underlying condition with diabetic polyneuropathy: Secondary | ICD-10-CM | POA: Diagnosis not present

## 2018-05-08 MED ORDER — NORTRIPTYLINE HCL 10 MG PO CAPS: ORAL_CAPSULE | ORAL | 3 refills | Status: DC

## 2018-05-08 NOTE — Patient Instructions (Addendum)
Increase nortriptyline 30mg  at bedtime x 1 month, then increase to 40mg  at bedtime  Call with update in 3 months, so I can send a new prescription for 50mg  tablets  Return to clinic in 6 months

## 2018-05-08 NOTE — Progress Notes (Signed)
Follow-up Visit   Date: 05/08/18    Erin [name] MRN: [id number] DOB: [birth year]   Interim History: Erin [name] is a 62 y.o. right-handed Caucasian female with GERD, fibromyalgia, hypertension, depression, migraine, diabetes mellitus type 2, vitamin B12 deficiency, chronic pain syndrome, s/p R BKA returning to the clinic for follow-up of diabetic neuropathy.  The patient was accompanied to the clinic by self.  History of present illness: Starting around 2013, she developed sudden onset of numbness/tingling involving her right foot and within three months, she developed similar symptoms on the right. She also complains of burning sensation and intermittent stabbing pain over the arch of the feet.Discomfort is now at the level of mid-calf and involves her ankles and feet. She was started on Lyrica 123m TID which seems to alleviate some of the pain. She takes oxycodone 365mevery 4-5 hours. In the fall of 2015, she developed intermittent numbness/tingling of the hands which is worse in the morning.   She walks with a cane for balance. No recent falls. 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     She also seen podiatry and a chiropractor. She was told her spine was totally out of alignment and received laser therapy to her feet for three weeks, but felt even worse so stopped going.   She established care with me in January 2016 and was tried on several different medications:  Lyrica 20070mID (effective but developed swelling), gabapentin (ineffective, weight gain); nortriptyline (ineffective, dry mouth), Cymbalta (nausea).   UPDATE 10/31/2017:  She is here for 6 month follow-up visit.  She underwent R BKA on 8/31 for R foot osteomyelitis. She recovered well from her surgery and has a prosthetic leg and will be started therapy in January.  She continues to take gabapentin 900m110mree times daily and does not appreciate much benefit with respect  to her neuropathy. She has not suffered any falls.   UPDATE 05/08/2018:  She is here for 6 month follow-up visit.  She continues to have burning and stinging pain involving the left foot and right stump. Sometimes, she has severe stabbing pain in the hands too.  She has not suffered any falls and denies new weakness.  She uses a cane occasionally, especially for long distances.  She continues to takes roxicodone 30mg59mry 4-5 hours through her PCP's office, but has considered seeing pain management.     Medications:  Current Outpatient Medications on File Prior to Visit  Medication Sig Dispense Refill  . Albuterol Sulfate 108 (90 Base) MCG/ACT AEPB Inhale 2 puffs into the lungs every 6 (six) hours as needed (for wheezing). 1 each 0  . allopurinol (ZYLOPRIM) 100 MG tablet Take 1 tablet (100 mg total) by mouth 2 (two) times daily. Take 2 tablets by mouth 2 times daily. 120 tablet 3  . benazepril-hydrochlorthiazide (LOTENSIN HCT) 20-25 MG per tablet Take 1 tablet by mouth daily.    . cloNIDine (CATAPRES) 0.1 MG tablet   1  . colchicine 0.6 MG tablet Take 1 tablet (0.6 mg total) by mouth 2 (two) times daily. Twice daily until gout flare resolves (Patient taking differently: Take 0.6 mg by mouth daily as needed. ) 60 tablet 3  . ferrous sulfate 325 (65 FE) MG tablet Take 325 mg by mouth daily with breakfast.    . furosemide (LASIX) 40 MG tablet Take 40 mg by mouth daily.    . gabMarland Kitchenpentin (NEURONTIN) 300 MG capsule Take 3 capsules (900  mg total) by mouth 3 (three) times daily. (Patient taking differently: Take 300-600 mg by mouth See admin instructions. Pt takes 300 mg twice daily , and 600 mg at bedtime) 270 capsule 11  . ibuprofen (ADVIL,MOTRIN) 200 MG tablet Take 200 mg by mouth every 6 (six) hours as needed.    Marland Kitchen levothyroxine (SYNTHROID, LEVOTHROID) 50 MCG tablet Take 50 mcg by mouth daily before breakfast.   10  . metFORMIN (GLUCOPHAGE) 500 MG tablet   0  . methocarbamol (ROBAXIN) 500 MG tablet Take  1 tablet (500 mg total) by mouth every 8 (eight) hours as needed for muscle spasms. 30 tablet 0  . metoCLOPramide (REGLAN) 10 MG tablet Take 10 mg by mouth 4 (four) times daily.    . nortriptyline (PAMELOR) 10 MG capsule Start nortriptyline 64m at bedtime for 2 week, then increase to 2 tablet at bedtime 180 capsule 3  . omeprazole (PRILOSEC) 40 MG capsule Take 40 mg by mouth daily.    .Marland KitchenoxyCODONE (ROXICODONE) 30 MG immediate release tablet Take 2 tablets (60 mg total) by mouth every 6 (six) hours as needed for severe pain. 30 tablet 0  . promethazine (PHENERGAN) 25 MG tablet   1  . SUMAtriptan (IMITREX) 100 MG tablet Take 100 mg by mouth every 2 (two) hours as needed for migraine.   9   No current facility-administered medications on file prior to visit.     Allergies: No Known Allergies  Review of Systems:  CONSTITUTIONAL: No fevers, chills, night sweats, or weight loss.  EYES: No visual changes or eye pain ENT: No hearing changes.  No history of nose bleeds.   RESPIRATORY: No cough, wheezing and shortness of breath.   CARDIOVASCULAR: Negative for chest pain, and palpitations.   GI: Negative for abdominal discomfort, blood in stools or black stools.  No recent change in bowel habits.   GU:  No history of incontinence.   MUSCLOSKELETAL: No history of joint pain or swelling.  No myalgias.   SKIN: Negative for lesions, rash, and itching.   ENDOCRINE: Negative for cold or heat intolerance, polydipsia or goiter.   PSYCH:  No depression or anxiety symptoms.   NEURO: As Above.   Vital Signs:  BP 104/68   Pulse 88   Ht _0  (1.6 m)   Wt 236 lb 4 oz (107.2 kg)   SpO2 92%   BMI 41.85 kg/m   General Medical Exam:   General:  Well appearing, comfortable  Eyes/ENT: see cranial nerve examination.   Neck: No masses appreciated.  Full range of motion without tenderness.  No carotid bruits. Respiratory:  Clear to auscultation, good air entry bilaterally.   Cardiac:  Regular rate and  rhythm, no murmur.   Ext:  R BKA  Neurological Exam: MENTAL STATUS including orientation to time, place, person, recent and remote memory, attention span and concentration, language, and fund of knowledge is normal.  Speech is not dysarthric.  CRANIAL NERVES:  Pupils equal round and reactive to light.  Normal conjugate, extra-ocular eye movements in all directions of gaze.  Left mild ptosis (old).  Face is symmetric.   MOTOR:  Motor strength is 5/5 in all extremities, R BKA, except mild intrinsic hand weakness (5-/5)   SENSORY: Vibration reduced at the knees and absent at the left ankle.  REFLEXES:  Reflexes are 2+/4 throughout, except absent at the left ankle.   COORDINATION/GAIT:  Gait appears stable, mild unsteadiness due to R prosthetic leg  Data: Labs 12/16/2014:  ESR 45, vitamin B12 348, vitamin B6 6.5, copper 131, TSH 7.6, fT3 2.4, fT4 0.88, HbA1c 5.6  NCS/EMG of the right > left upper extremity 09/21/2016:  The electrode diagnostic testing is most consistent with a chronic sensorimotor polyneuropathy, axon loss in type, affecting the right upper extremity. Overall, these findings are mild in degree electrically.  IMPRESSION: 1.  Peripheral neuropathy involving a glove-stocking distribution, likely idiopathic as her diabetes has been well-controlled  - Previously tried:  Nortriptyline (dry mouth), Lyrica (bilateral leg edema), lidocaine ointment, Cymbalta (nausea)  - Continue gabapentin 960m TID  - Increase nortriptyline 353mat bedtime x 1 month, then increase to 4056mt bedtime  - Call with update in 3 months, if tolerating then send new Rx for nortriptyline 30m55mb at bedtime  2.  Left meralgia paresthetica, unchanged.   3.  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  If I can answer any additional questions, I would be pleased to do so.    Sincerely,    Avyn Coate K. PatePosey Pronto

## 2018-08-02 DIAGNOSIS — M797 Fibromyalgia: Secondary | ICD-10-CM | POA: Diagnosis not present

## 2018-08-02 DIAGNOSIS — E782 Mixed hyperlipidemia: Secondary | ICD-10-CM | POA: Diagnosis not present

## 2018-08-02 DIAGNOSIS — E039 Hypothyroidism, unspecified: Secondary | ICD-10-CM | POA: Diagnosis not present

## 2018-08-02 DIAGNOSIS — G894 Chronic pain syndrome: Secondary | ICD-10-CM | POA: Diagnosis not present

## 2018-08-02 DIAGNOSIS — E109 Type 1 diabetes mellitus without complications: Secondary | ICD-10-CM | POA: Diagnosis not present

## 2018-08-02 DIAGNOSIS — G629 Polyneuropathy, unspecified: Secondary | ICD-10-CM | POA: Diagnosis not present

## 2018-08-14 DIAGNOSIS — Z79899 Other long term (current) drug therapy: Secondary | ICD-10-CM | POA: Diagnosis not present

## 2018-08-14 DIAGNOSIS — M129 Arthropathy, unspecified: Secondary | ICD-10-CM | POA: Diagnosis not present

## 2018-08-14 DIAGNOSIS — G629 Polyneuropathy, unspecified: Secondary | ICD-10-CM | POA: Diagnosis not present

## 2018-08-14 DIAGNOSIS — M545 Low back pain: Secondary | ICD-10-CM | POA: Diagnosis not present

## 2018-08-14 DIAGNOSIS — G8929 Other chronic pain: Secondary | ICD-10-CM | POA: Diagnosis not present

## 2018-08-29 DIAGNOSIS — D539 Nutritional anemia, unspecified: Secondary | ICD-10-CM | POA: Diagnosis not present

## 2018-08-29 DIAGNOSIS — M129 Arthropathy, unspecified: Secondary | ICD-10-CM | POA: Diagnosis not present

## 2018-08-29 DIAGNOSIS — Z79899 Other long term (current) drug therapy: Secondary | ICD-10-CM | POA: Diagnosis not present

## 2018-08-29 DIAGNOSIS — R5383 Other fatigue: Secondary | ICD-10-CM | POA: Diagnosis not present

## 2018-08-29 DIAGNOSIS — E559 Vitamin D deficiency, unspecified: Secondary | ICD-10-CM | POA: Diagnosis not present

## 2018-08-29 DIAGNOSIS — E7800 Pure hypercholesterolemia, unspecified: Secondary | ICD-10-CM | POA: Diagnosis not present

## 2018-08-29 DIAGNOSIS — E1165 Type 2 diabetes mellitus with hyperglycemia: Secondary | ICD-10-CM | POA: Diagnosis not present

## 2018-09-04 DIAGNOSIS — Z79899 Other long term (current) drug therapy: Secondary | ICD-10-CM | POA: Diagnosis not present

## 2018-09-18 DIAGNOSIS — M5440 Lumbago with sciatica, unspecified side: Secondary | ICD-10-CM | POA: Diagnosis not present

## 2018-10-02 DIAGNOSIS — Z79899 Other long term (current) drug therapy: Secondary | ICD-10-CM | POA: Diagnosis not present

## 2018-10-02 DIAGNOSIS — E039 Hypothyroidism, unspecified: Secondary | ICD-10-CM | POA: Diagnosis not present

## 2018-10-02 DIAGNOSIS — M5136 Other intervertebral disc degeneration, lumbar region: Secondary | ICD-10-CM | POA: Diagnosis not present

## 2018-10-02 DIAGNOSIS — M5440 Lumbago with sciatica, unspecified side: Secondary | ICD-10-CM | POA: Diagnosis not present

## 2018-10-04 ENCOUNTER — Encounter (INDEPENDENT_AMBULATORY_CARE_PROVIDER_SITE_OTHER): Payer: Self-pay | Admitting: Family

## 2018-10-04 ENCOUNTER — Ambulatory Visit (INDEPENDENT_AMBULATORY_CARE_PROVIDER_SITE_OTHER): Payer: Commercial Managed Care - PPO | Admitting: Physician Assistant

## 2018-10-04 VITALS — Ht 63.0 in | Wt 236.0 lb

## 2018-10-04 DIAGNOSIS — S88111A Complete traumatic amputation at level between knee and ankle, right lower leg, initial encounter: Secondary | ICD-10-CM

## 2018-10-04 DIAGNOSIS — E1142 Type 2 diabetes mellitus with diabetic polyneuropathy: Secondary | ICD-10-CM | POA: Diagnosis not present

## 2018-10-04 DIAGNOSIS — Z89511 Acquired absence of right leg below knee: Secondary | ICD-10-CM | POA: Diagnosis not present

## 2018-10-04 NOTE — Progress Notes (Signed)
Office Visit Note   Patient: Erin [name]           Date of Birth: 1956/01/15           MRN: [id number] Visit Date: 10/04/2018              Requested by: Samuel JesterButler, Cynthia, DO [address] [place], [place] [id number] PCP: Samuel JesterButler, Cynthia, DO  Chief Complaint  Patient presents with  . Right Leg - Pain      HPI: The patient is a 62 yo female here with her husband for follow up of her right transtibial amputation 07/15/17. She reports she has done very well [facility] and has not had any problems until last Friday when she noted some slight blister and sore area over the distal residual limb. She stayed off the right leg and tried not to wear the prosthesis until today's appointment and the area has healed. She has not had any adjustments to her prosthesis since she got it almost 1 year ago today. We discussed that her residual limb has atrophied and changed shape over time and she likely needs a new socket. She reports no other problems and reports the left leg and foot are doing well, without ulcers and she and her husband monitor the left foot daily.   Assessment & Plan: Visit Diagnoses:  1. 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 Diabetic polyneuropathy associated with type 2 diabetes mellitus (HCC)     Plan: The patient was given orders for [facility] for a new K3 socket and re evaluation of the prosthesis for a new prosthesis if indicated. The patient is expected to be a community ambulator and walk at varying speeds and traverse most environmental barriers on a regular basis for activities of living such as shopping and attending community events. She would be able to ambulate on uneven surfaces including stairs and curbs, gravel surfaces and bleachers.  She will minimize wearing the prosthesis until the new one is obtained. She will follow up annually.  Follow-Up Instructions: Return in about 1 year (around 10/05/2019).   Ortho  Exam  Patient is alert, oriented, no adenopathy, well-dressed, normal affect, normal respiratory effort. The right transtibial amputation is well healed with atrophic changes as expected. There is a small ~ 1 cm darkened callused area without signs of breakdown currently. No signs of infection or cellulitis.  She is going to use some mepelix border dressing to the area when she has to wear her prosthesis until a new one is obtained.  Imaging: No results found. No images are attached to the encounter.  Labs: Lab Results  Component Value Date   HGBA1C 5.8 (H) 07/15/2017   HGBA1C 6.1 (H) 12/28/2016   HGBA1C 6.0 (H) 07/04/2015   ESRSEDRATE 90 (H) 07/05/2015   ESRSEDRATE 45 (H) 12/10/2014   LABURIC 8.4 (H) 02/16/2017   LABURIC 6.5 11/23/2016   LABURIC 8.3 (H) 11/02/2016     Lab Results  Component Value Date   ALBUMIN 3.8 03/09/2017   ALBUMIN 4.0 12/25/2015   ALBUMIN 3.7 07/05/2015   LABURIC 8.4 (H) 02/16/2017   LABURIC 6.5 11/23/2016   LABURIC 8.3 (H) 11/02/2016    Body mass index is 41.81 kg/m.  Orders:  No orders of the defined types were placed in this encounter.  No orders of the defined types were placed in this encounter.    Procedures: No procedures performed  Clinical Data:  No additional findings.  ROS:  All other systems negative, except as noted in the HPI. Review of Systems  Objective: Vital Signs: Ht 5\' 3"  (1.6 m)   Wt 236 lb (107 kg)   BMI 41.81 kg/m   Specialty Comments:  No specialty comments available.  PMFS History: Patient Active Problem List   Diagnosis Date Noted  . Diabetic ulcer of toe of left foot associated with type 2 diabetes mellitus, limited to breakdown of skin (HCC) 08/08/2017  . 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 Charcot foot due to diabetes mellitus (HCC) 07/13/2017  . OSA (obstructive sleep apnea) 05/24/2017  . Sleep related hypoxia 05/24/2017  . Unilateral primary osteoarthritis, left  knee 05/09/2017  . Pulmonary hypertension, low resistance (HCC) 04/06/2017  . At risk for obstructive sleep apnea 04/06/2017  . Idiopathic chronic venous hypertension of both lower extremities with inflammation 03/21/2017  . 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 Influenza with respiratory manifestation 12/28/2016  . Acute kidney injury (HCC) 12/28/2016  . CKD (chronic kidney disease) stage 3, GFR 30-59 ml/min (HCC) 12/28/2016  . Hypoxia 12/27/2016  . 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 Essential hypertension 07/06/2015  . Fibromyalgia 07/06/2015  . Dyspnea 07/04/2015  . Diabetic neuropathy (HCC) 12/10/2014   Past Medical History:  Diagnosis Date  . Arthritis    Osteoarthritis right calcaneous  . Carpal tunnel syndrome   . Charcot ankle, right   . Chronic kidney disease    patient unaware- does not see a nephrologist  . Chronic pain   . Complication of anesthesia   . Depression   . Diabetes mellitus    Tyoe II  . Diabetic neuropathy (HCC)   . Fibromyalgia   . GERD (gastroesophageal reflux disease)   . Headache   . Hernia   . History of blood transfusion    after miscarriage  . Hyperlipemia   . Hypertension   . Miscarriage    twins  . Peripheral neuropathy   . PONV (postoperative nausea and vomiting)    no problem with medication  . TMJ syndrome     Family History  Problem Relation Age of Onset  . Aneurysm Father        Deceased, 2  . CAD Father   . Heart disease Father   . Cancer Father   . Pancreatic cancer Mother        Deceased, 64    Past Surgical History:  Procedure Laterality Date  . ABDOMINAL SURGERY    . AMPUTATION Right 07/15/2017   Procedure: RIGHT BELOW KNEE AMPUTATION;  Surgeon: Nadara Mustard, MD;  Location: [facility] OR;  Service: Orthopedics;  Laterality: Right;  . BUNIONECTOMY Bilateral    2 separate surgies  . COLONOSCOPY    . PARTIAL HYSTERECTOMY  1983  . TMJ  ARTHROPLASTY     left side x 2  . TONSILLECTOMY    . TUBAL LIGATION  1983  . Umbilical hernia     x2  . VAGINAL HYSTERECTOMY  2002   Social History   Occupational History  . Occupation: disabled  Tobacco Use  . Smoking status: Former Smoker    Packs/day: 1.00    Years: 32.00    Pack years: 32.00    Types: Cigarettes    Last attempt to quit: 11/15/2008    Years since quitting: 9.8  . Smokeless tobacco: Never Used  Substance and Sexual Activity  . Alcohol use:  No    Alcohol/week: 0.0 standard drinks  . Drug use: No  . Sexual activity: Not on file

## 2018-10-27 DIAGNOSIS — M5136 Other intervertebral disc degeneration, lumbar region: Secondary | ICD-10-CM | POA: Diagnosis not present

## 2018-10-27 DIAGNOSIS — F329 Major depressive disorder, single episode, unspecified: Secondary | ICD-10-CM | POA: Diagnosis not present

## 2018-10-27 DIAGNOSIS — E039 Hypothyroidism, unspecified: Secondary | ICD-10-CM | POA: Diagnosis not present

## 2018-10-27 DIAGNOSIS — M5440 Lumbago with sciatica, unspecified side: Secondary | ICD-10-CM | POA: Diagnosis not present

## 2018-10-27 DIAGNOSIS — Z79899 Other long term (current) drug therapy: Secondary | ICD-10-CM | POA: Diagnosis not present

## 2018-10-29 IMAGING — DX DG CHEST 2V
2 series · 2 of 2 positions shown · non-contrast
Comparison: 07/03/2015 .

CLINICAL DATA: Shortness of breath.

EXAM:
CHEST  2 VIEW

[chest pa]
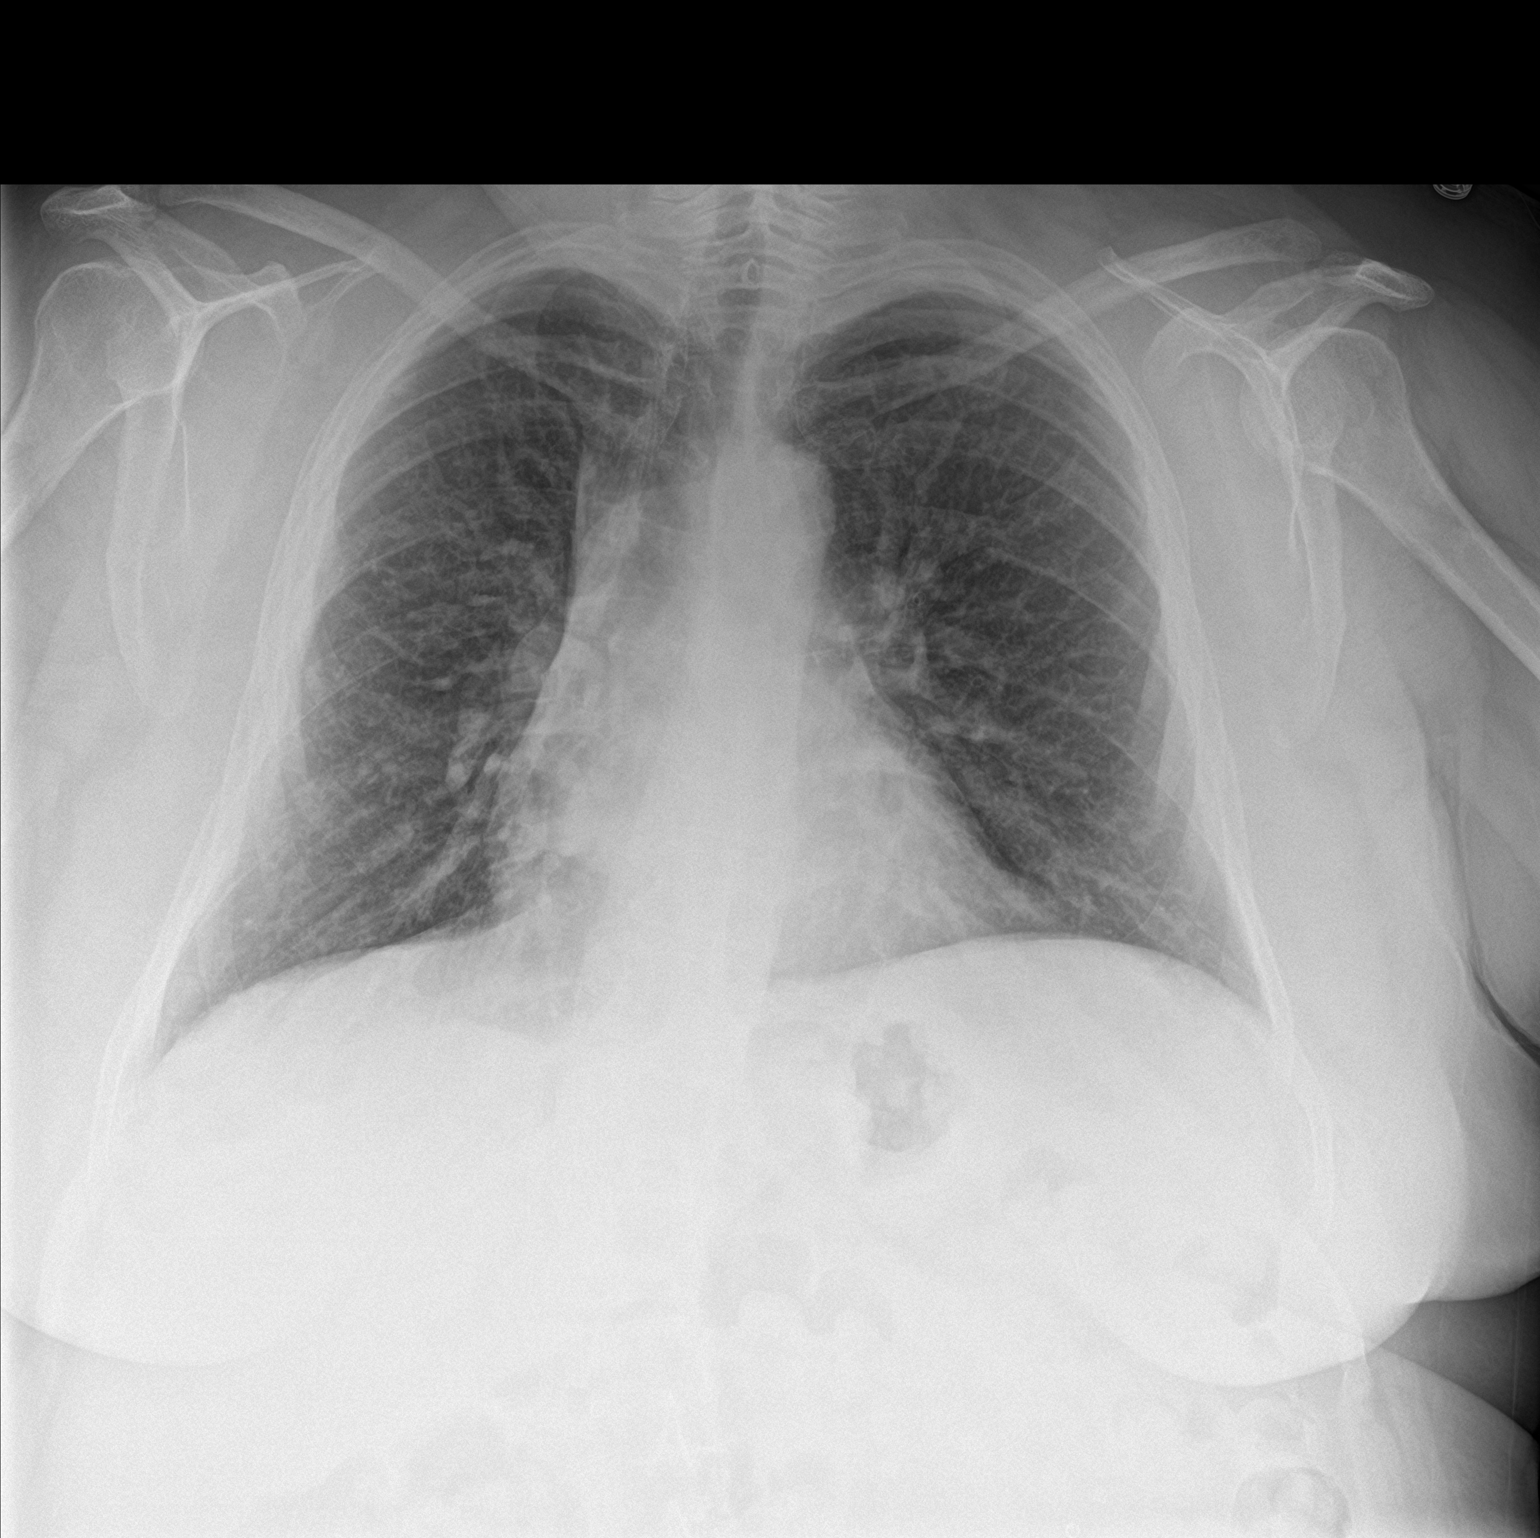

[chest lat]
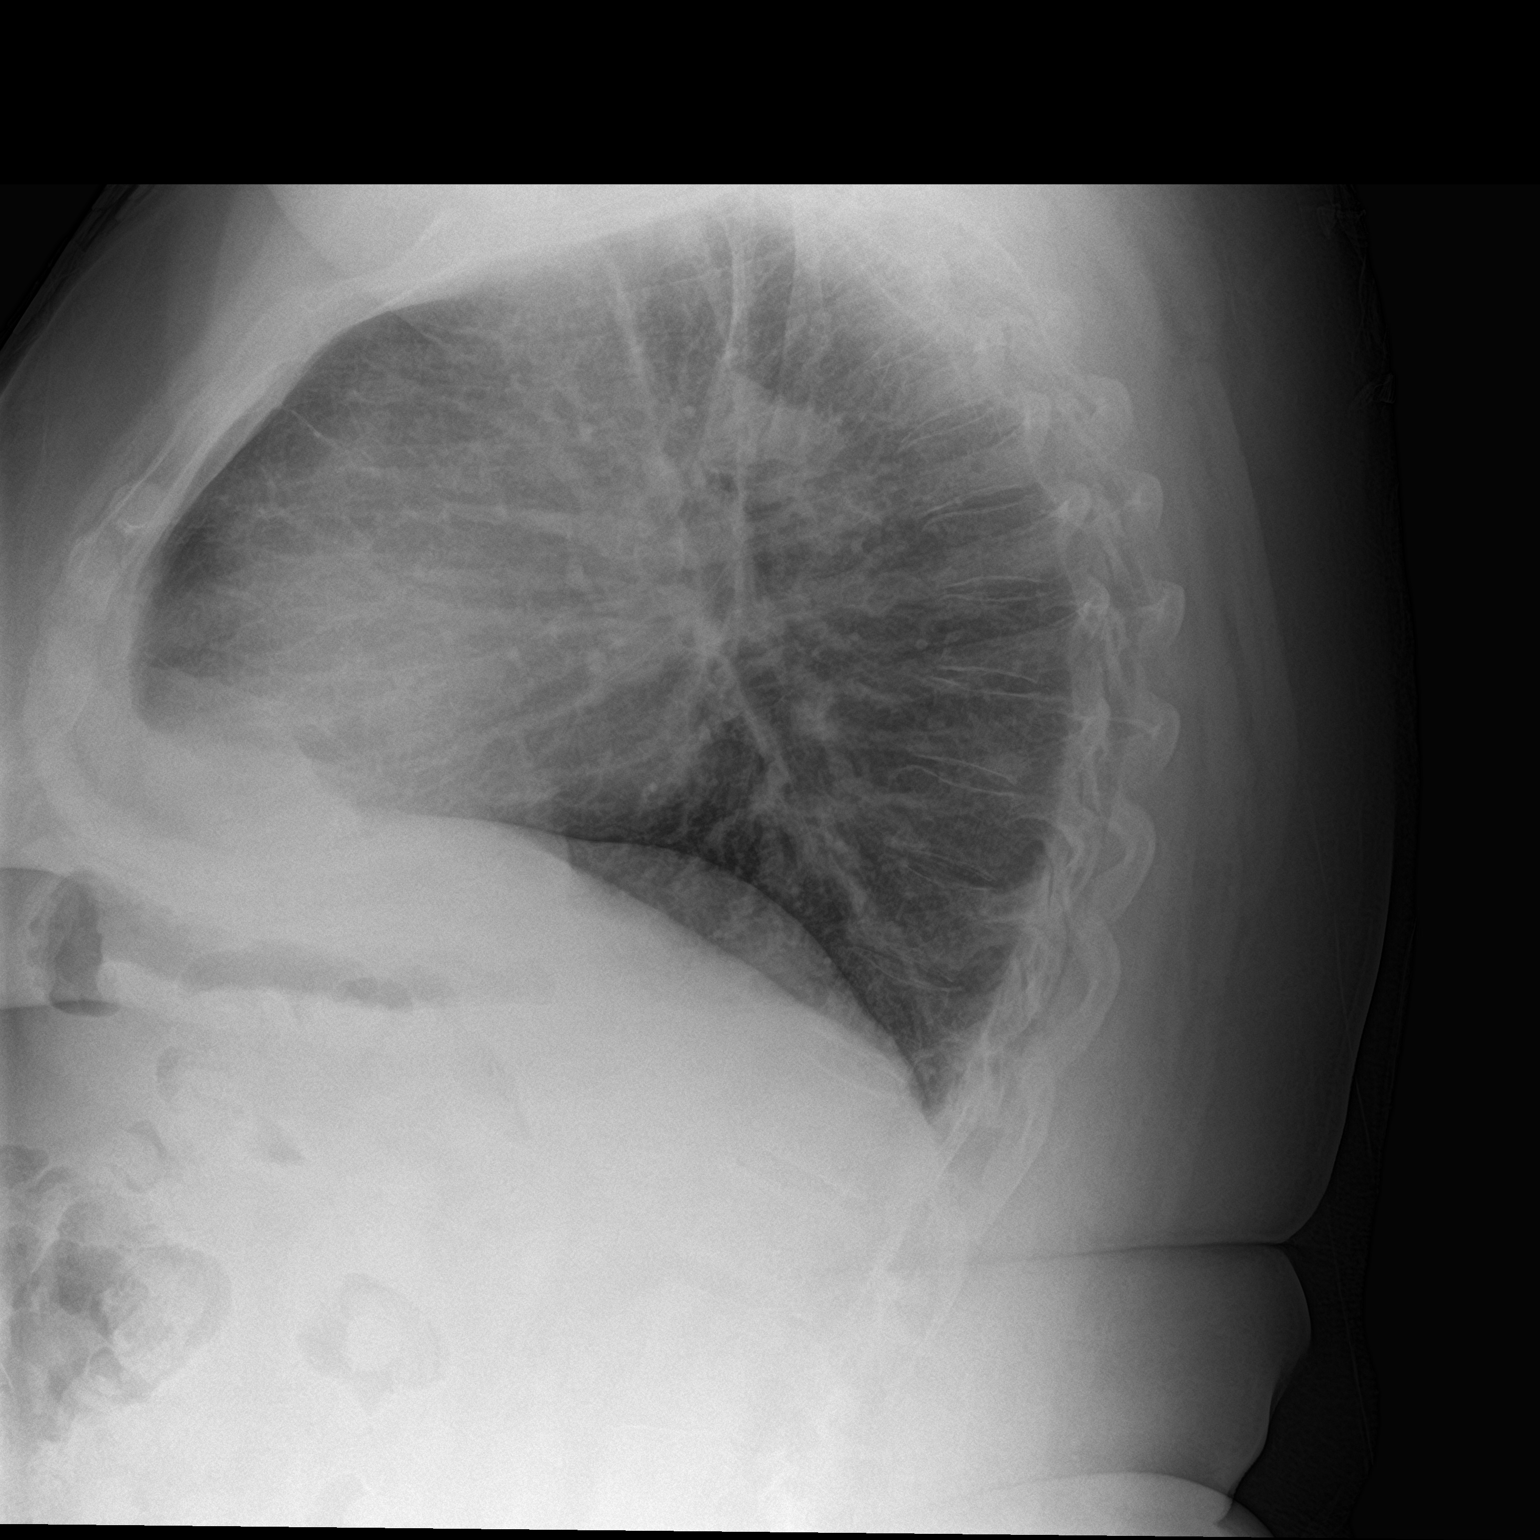

[2 of 2 positions shown; findings below may reference images not displayed]

FINDINGS: Mediastinum hilar structures are normal. Low lung volumes with mild
basilar atelectasis. No pleural effusion or pneumothorax. Heart size
normal. Degenerative changes thoracic spine with thoracic spine
scoliosis noted
IMPRESSION: Low lung volumes with mild basilar atelectasis.

## 2018-11-03 ENCOUNTER — Ambulatory Visit: Payer: BLUE CROSS/BLUE SHIELD | Admitting: Neurology

## 2018-11-23 DIAGNOSIS — Z79899 Other long term (current) drug therapy: Secondary | ICD-10-CM | POA: Diagnosis not present

## 2018-11-23 DIAGNOSIS — M792 Neuralgia and neuritis, unspecified: Secondary | ICD-10-CM | POA: Diagnosis not present

## 2018-11-23 DIAGNOSIS — M5136 Other intervertebral disc degeneration, lumbar region: Secondary | ICD-10-CM | POA: Diagnosis not present

## 2018-11-23 DIAGNOSIS — M545 Low back pain: Secondary | ICD-10-CM | POA: Diagnosis not present

## 2018-11-23 DIAGNOSIS — M25561 Pain in right knee: Secondary | ICD-10-CM | POA: Diagnosis not present

## 2018-12-01 ENCOUNTER — Telehealth: Payer: Self-pay | Admitting: Acute Care

## 2018-12-01 NOTE — Telephone Encounter (Signed)
Spoke with pt regarding f/u on lung nodules on previous CT scan.  PT states she has been busy with other medical issues and states she will call me back next week to schedule f/u with Dr Craige Cotta or Maralyn Sago .  Will leave message in my box to f/u with pt next week.

## 2018-12-07 NOTE — Telephone Encounter (Signed)
LMTC x 1 ( needs appt with Dr Craige Cotta or Maralyn Sago)

## 2018-12-13 NOTE — Telephone Encounter (Signed)
LMTC x 1  

## 2018-12-15 NOTE — Telephone Encounter (Signed)
LMTC x 1  

## 2018-12-19 DIAGNOSIS — M545 Low back pain: Secondary | ICD-10-CM | POA: Diagnosis not present

## 2018-12-19 DIAGNOSIS — M5136 Other intervertebral disc degeneration, lumbar region: Secondary | ICD-10-CM | POA: Diagnosis not present

## 2018-12-19 DIAGNOSIS — Z79899 Other long term (current) drug therapy: Secondary | ICD-10-CM | POA: Diagnosis not present

## 2018-12-19 DIAGNOSIS — G8929 Other chronic pain: Secondary | ICD-10-CM | POA: Diagnosis not present

## 2018-12-19 DIAGNOSIS — M792 Neuralgia and neuritis, unspecified: Secondary | ICD-10-CM | POA: Diagnosis not present

## 2018-12-21 NOTE — Telephone Encounter (Signed)
Per office policy will close this message due to no response from pt.

## 2019-01-11 DIAGNOSIS — G629 Polyneuropathy, unspecified: Secondary | ICD-10-CM | POA: Diagnosis not present

## 2019-01-11 DIAGNOSIS — D539 Nutritional anemia, unspecified: Secondary | ICD-10-CM | POA: Diagnosis not present

## 2019-01-11 DIAGNOSIS — Z79899 Other long term (current) drug therapy: Secondary | ICD-10-CM | POA: Diagnosis not present

## 2019-01-11 DIAGNOSIS — E559 Vitamin D deficiency, unspecified: Secondary | ICD-10-CM | POA: Diagnosis not present

## 2019-01-11 DIAGNOSIS — M129 Arthropathy, unspecified: Secondary | ICD-10-CM | POA: Diagnosis not present

## 2019-01-11 DIAGNOSIS — E1165 Type 2 diabetes mellitus with hyperglycemia: Secondary | ICD-10-CM | POA: Diagnosis not present

## 2019-01-11 DIAGNOSIS — M545 Low back pain: Secondary | ICD-10-CM | POA: Diagnosis not present

## 2019-01-11 DIAGNOSIS — G8929 Other chronic pain: Secondary | ICD-10-CM | POA: Diagnosis not present

## 2019-01-11 DIAGNOSIS — M5136 Other intervertebral disc degeneration, lumbar region: Secondary | ICD-10-CM | POA: Diagnosis not present

## 2019-01-11 DIAGNOSIS — R300 Dysuria: Secondary | ICD-10-CM | POA: Diagnosis not present

## 2019-01-11 DIAGNOSIS — E7800 Pure hypercholesterolemia, unspecified: Secondary | ICD-10-CM | POA: Diagnosis not present

## 2019-02-02 ENCOUNTER — Ambulatory Visit: Payer: Medicare Other | Admitting: Neurology

## 2019-04-15 IMAGING — CT CT CHEST W/O CM
2 of 3 series · 14 of 36 positions shown, 17 images · non-contrast
Comparison: 03/17/2017

CLINICAL DATA: Follow up inflammatory changes within the right
lung.

EXAM:
CT CHEST WITHOUT CONTRAST
TECHNIQUE: Multidetector CT imaging of the chest was performed following the
standard protocol without IV contrast.

[Series 2: thorax · axial · 0.78mm/px · z∈[-302,-62]mm · 11 of 142 slices shown, 14 images]
[im 11/142  mediastinal]
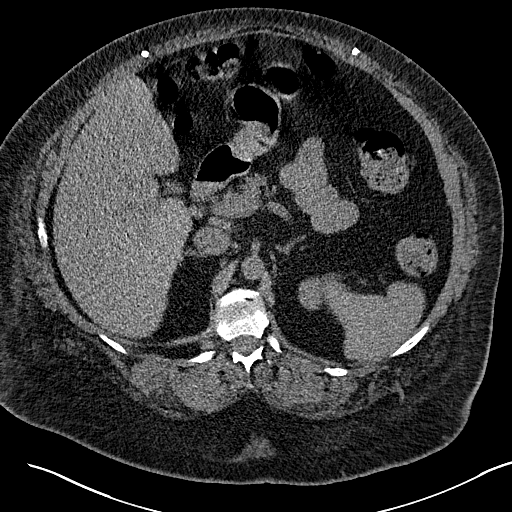
[im 11/142  lung]
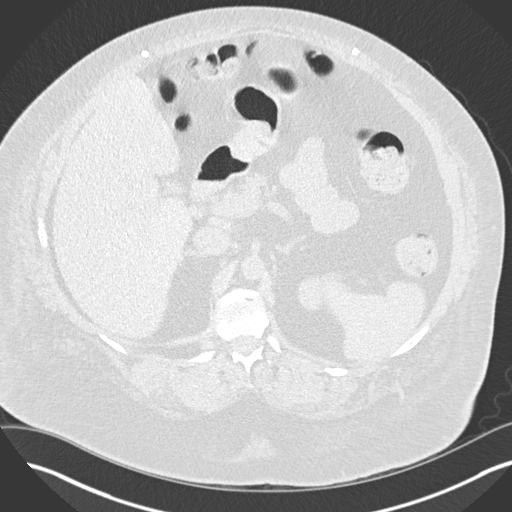
[im 21/142  lung]
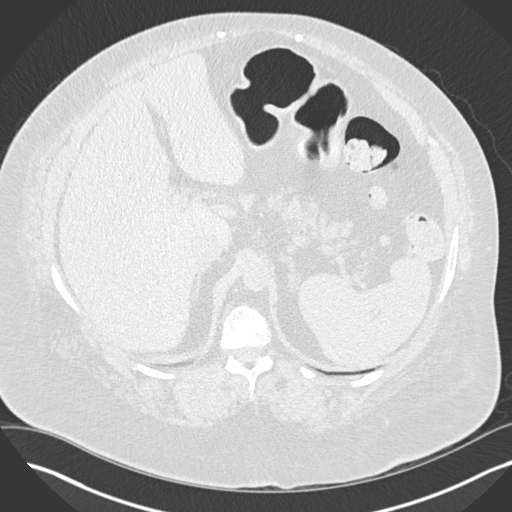
[im 32/142  lung]
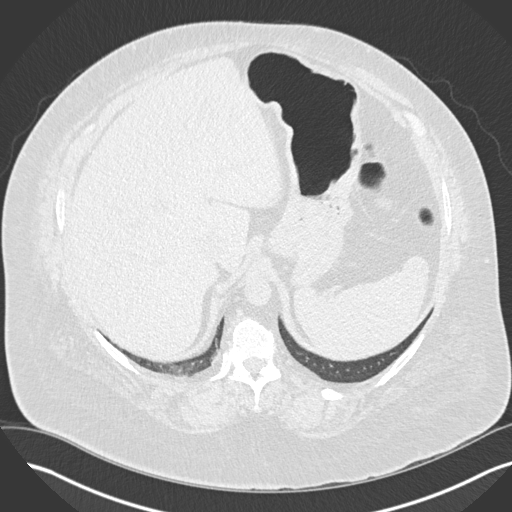
[im 48/142  lung]
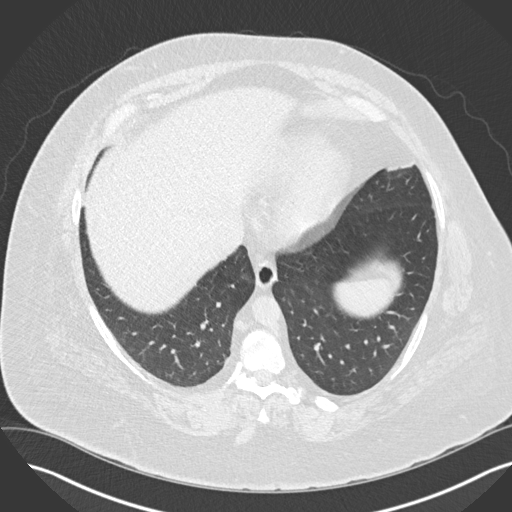
[im 58/142  mediastinal]
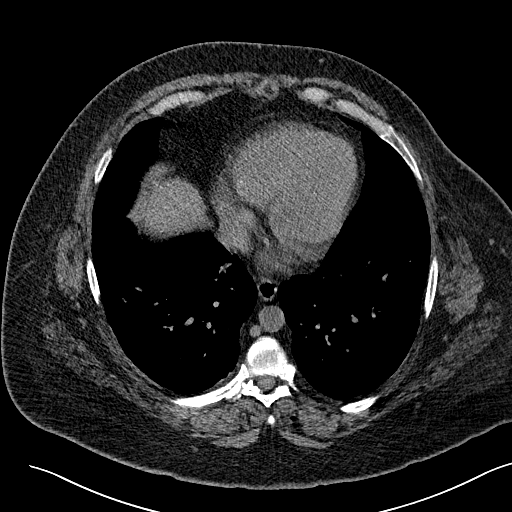
[im 58/142  lung]
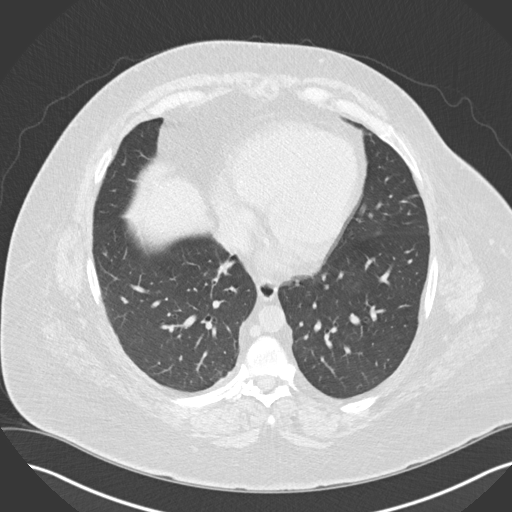
[im 74/142  lung]
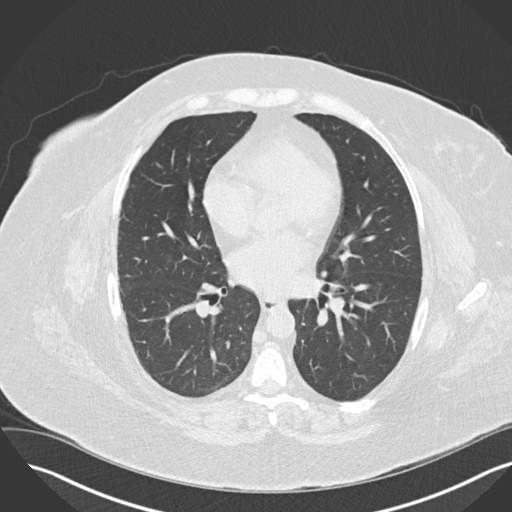
[im 84/142  lung]
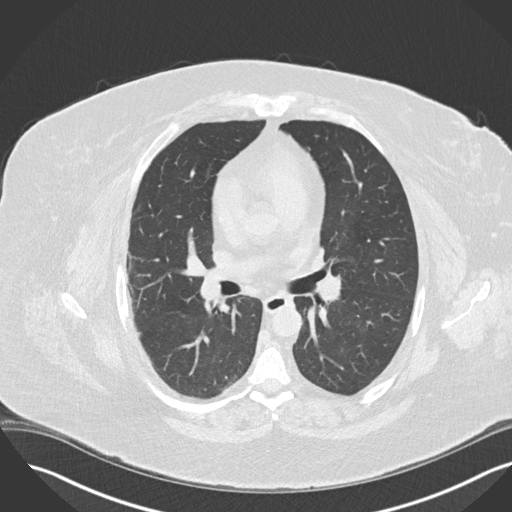
[im 95/142  lung]
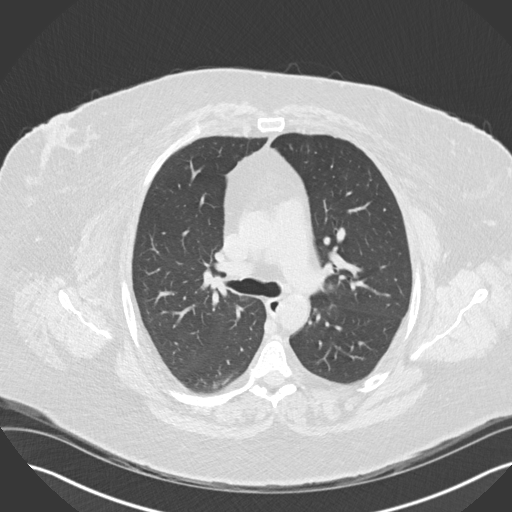
[im 110/142  mediastinal]
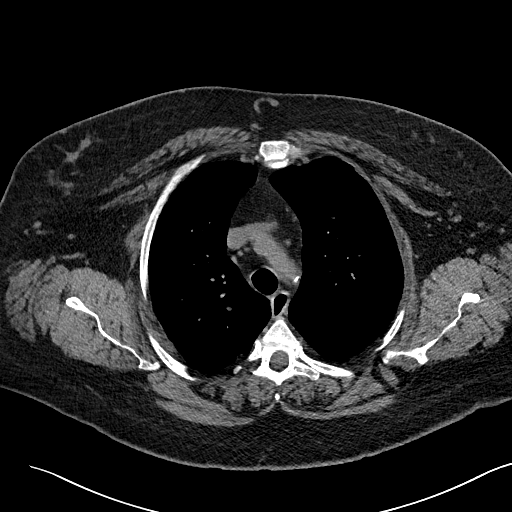
[im 110/142  lung]
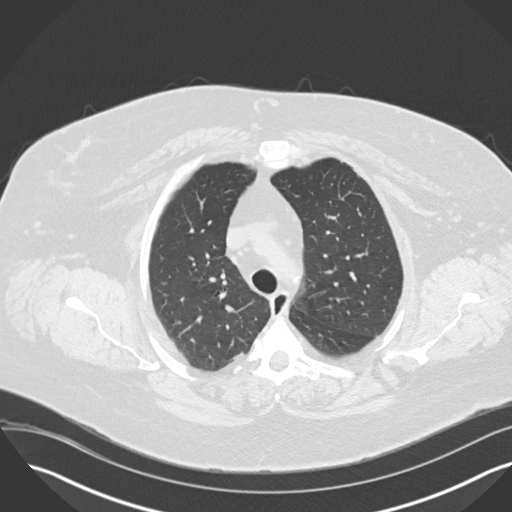
[im 121/142  lung]
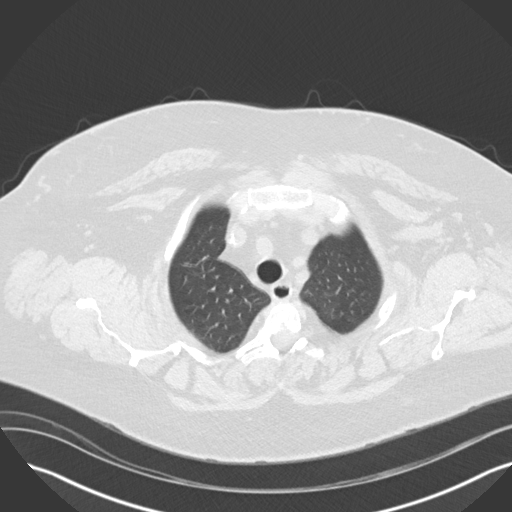
[im 131/142  lung]
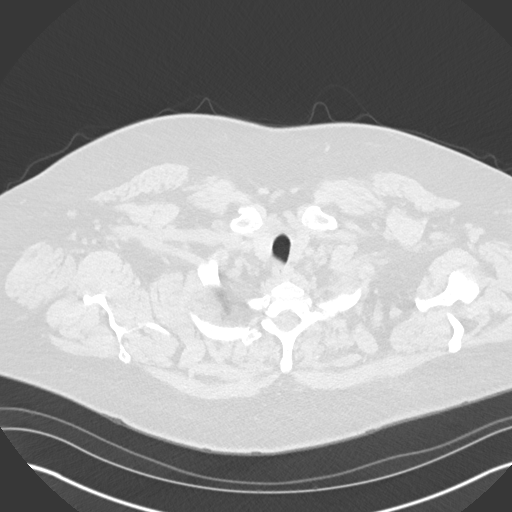

[Series 5: coronal · coronal · 0.56mm/px · 3 of 155 slices shown]
[im 31/155  lung]
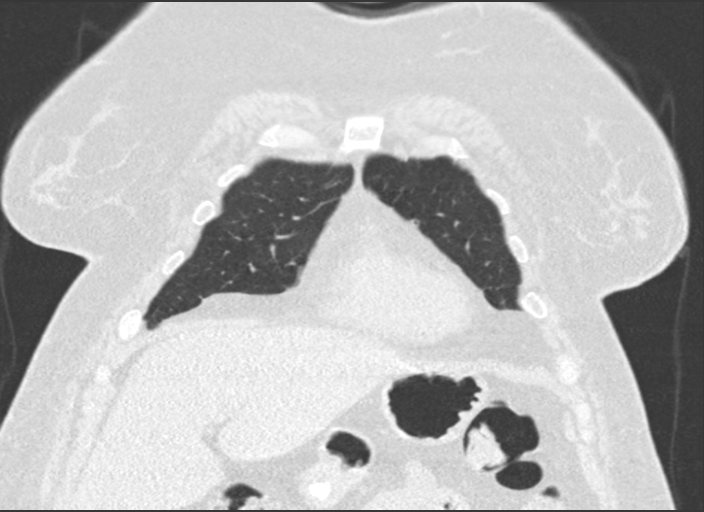
[im 62/155  lung]
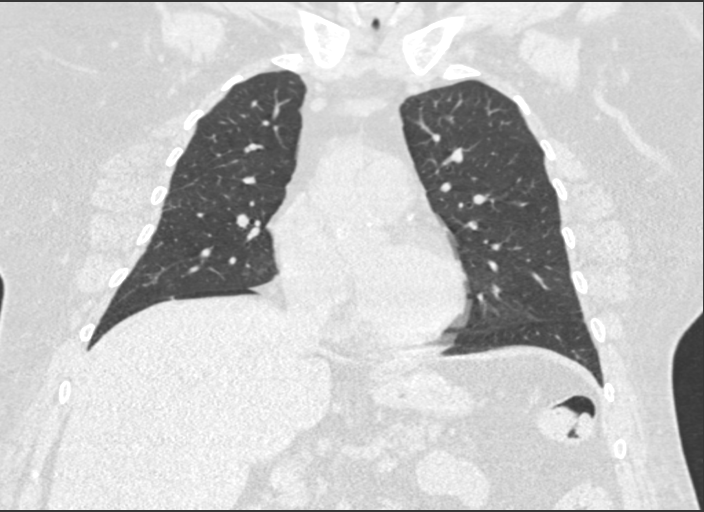
[im 93/155  lung]
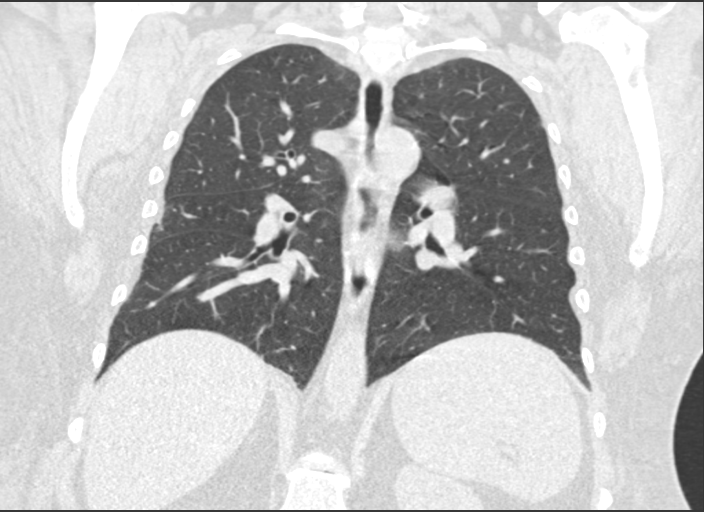

[14 of 36 positions shown; findings below may reference images not displayed]

FINDINGS: Cardiovascular: The heart size appears normal. Aortic
atherosclerosis identified. There is no pericardial effusion.
Calcifications in the LAD and left circumflex coronary artery.

Mediastinum/Nodes: The trachea appears patent and is midline. Normal
appearance of the esophagus. No mediastinal or hilar adenopathy
identified.

Lungs/Pleura: No pleural effusion. Cluster of subpleural and
perifissural tree-in-bud nodules identified within the posterior
basal right upper lobe and superior segment of right lower lobe
which appear unchanged from the previous exam, image 39 of series 3
and image 46 of series 3. New ground-glass and solid nodule of
within the posterior right upper lobe is identified, image 74 of
series 3. This measures 1.5 cm.

Upper Abdomen: No acute abnormality.

Musculoskeletal: No aggressive lytic or sclerotic bone lesions
identified.
IMPRESSION: 1. Stable appearance of clustered nodules within the right lung
which are likely the sequelae of inflammatory or infectious
bronchiolitis.
2. New part solid nodules identified within the right upper lobe.
Nonspecific. Follow-up non-contrast CT recommended at 3-6 months to
confirm persistence. If unchanged, and solid component remains <6
mm, annual CT is recommended until 5 years of stability has been
established. If persistent these nodules should be considered highly
suspicious if the solid component of the nodule is 6 mm or greater
in size and enlarging. This recommendation follows the consensus
statement: Guidelines for Management of Incidental Pulmonary Nodules
Detected on CT Images:From the [HOSPITAL] 8051; published
online before print (10.1148/radiol.3549404040).
3. Aortic Atherosclerosis ([id number]). Multi vessel coronary
artery calcification noted.

## 2019-05-24 ENCOUNTER — Encounter: Payer: Self-pay | Admitting: Neurology

## 2019-05-25 ENCOUNTER — Other Ambulatory Visit: Payer: Self-pay

## 2019-05-25 ENCOUNTER — Telehealth (INDEPENDENT_AMBULATORY_CARE_PROVIDER_SITE_OTHER): Payer: Commercial Managed Care - PPO | Admitting: Neurology

## 2019-05-25 VITALS — Ht 63.0 in | Wt 230.0 lb

## 2019-05-25 DIAGNOSIS — E0842 Diabetes mellitus due to underlying condition with diabetic polyneuropathy: Secondary | ICD-10-CM | POA: Diagnosis not present

## 2019-05-25 NOTE — Progress Notes (Signed)
    Virtual Visit via Telephone Note The purpose of this virtual visit is to provide medical care while limiting exposure to the novel coronavirus.    Consent was obtained for phone visit:  Yes.   Answered questions that patient had about telehealth interaction:  Yes.   I discussed the limitations, risks, security and privacy concerns of performing an evaluation and management service by telephone. I also discussed with the patient that there may be a patient responsible charge related to this service. The patient expressed understanding and agreed to proceed.  Pt location: Home Physician Location: office Name of referring provider:  Octavio Graves, DO I connected with .Nayanna Seaborn Hunsucker at patients initiation/request on 05/25/2019 at  9:50 AM EDT by telephone and verified that I am speaking with the correct person using two identifiers.  Pt MRN:  [id number] Pt DOB:  1956-06-30   History of Present Illness: This is a 63 year-old female with GERD, fibromyalgia, hypertension, depression, migraine, diabetes mellitus type 2, vitamin B12 deficiency, chronic pain syndrome, s/p R BKA returning to the clinic for follow-up of diabetic neuropathy.    She started seeing pain management earlier this year and she is no longer on gabapentin and nortriptyline and switched to Lyrica 200mg  TID and takes oxycodone 20mg  every 6 hours.  She has suffered twice and did not hurt herself.  She does not have any new complaints.    Assessment and Plan:   Painful diabetic peripheral neuropathy, now being followed by pain management for pain control.  She is getting significant relief with Lyrica 200 mg 3 times daily and oxycodone 20 mg every 6 hours.  She will continue to follow-up with them for ongoing care. I stressed the importance of fall precautions and encouraged her to use cane If she has any new neurological concerns, she is welcome to return to see me as needed.   Follow Up Instructions: I discussed the  assessment and treatment plan with the patient. The patient was provided an opportunity to ask questions and all were answered. The patient agreed with the plan and demonstrated an understanding of the instructions.   The patient was advised to call back or seek an in-person evaluation if the symptoms worsen or if the condition fails to improve as anticipated.   Total Time spent in visit with the patient was:  7 min, of which 100% of the time was spent in counseling and/or coordinating care.   Pt understands and agrees with the plan of care outlined.     Alda Berthold, DO

## 2019-06-01 ENCOUNTER — Ambulatory Visit: Payer: Medicare Other | Admitting: Neurology

## 2019-08-17 ENCOUNTER — Encounter: Payer: Self-pay | Admitting: Orthopaedic Surgery

## 2019-08-17 ENCOUNTER — Ambulatory Visit (INDEPENDENT_AMBULATORY_CARE_PROVIDER_SITE_OTHER): Payer: Commercial Managed Care - PPO | Admitting: Orthopaedic Surgery

## 2019-08-17 VITALS — Ht 62.0 in | Wt 233.0 lb

## 2019-08-17 DIAGNOSIS — S88111A Complete traumatic amputation at level between knee and ankle, right lower leg, initial encounter: Secondary | ICD-10-CM

## 2019-08-17 DIAGNOSIS — Z89511 Acquired absence of right leg below knee: Secondary | ICD-10-CM

## 2019-08-17 MED ORDER — MUPIROCIN 2 % EX OINT: 1.0000 "application " | TOPICAL_OINTMENT | Freq: Two times a day (BID) | CUTANEOUS | 0 refills | Status: DC

## 2019-08-17 NOTE — Progress Notes (Signed)
Office Visit Note   Patient: Erin [name]           Date of Birth: May 20, 1956           MRN: [id number] Visit Date: 08/17/2019              Requested by: Octavio Graves P, DO [address],  [place] [address] PCP: Scotty Court, DO   Assessment & Plan: Visit Diagnoses:  1. Amputation of right lower extremity below knee ([place])     Plan: The skin lesions appear to be blister skin that has sloughed off.  This is likely due to friction with the gel sock.  We placed Bactroban ointment and a Telfa on this.  She is to do this twice a day.  The area of the skin does not actually contact the prosthetic socket itself so I do not think any adjustments need to be made there.  She will wear her prosthesis apparently until the skin heals.  She will see [facility] later today to get a moisture wicking sock.  We will see her back as needed.  Follow-Up Instructions: Return if symptoms worsen or fail to improve.   Orders:  No orders of the defined types were placed in this encounter.  Meds ordered this encounter  Medications  . mupirocin ointment (BACTROBAN) 2 %    Sig: Apply 1 application topically 2 (two) times daily.    Dispense:  22 g    Refill:  0      Procedures: No procedures performed   Clinical Data: No additional findings.   Subjective: Chief Complaint  Patient presents with  . Right Knee - Pain    Erin [name] is a 63 year old female who is approximately 2 years status post right below-the-knee amputation.  She is coming in for evaluation of couple of areas on the anterior aspect of the knee with excoriated skin.  Denies any constitutional symptoms.  Denies any injuries.  She states that this likely is due to friction with the gel sock.   Review of Systems  Constitutional: Negative.   HENT: Negative.   Eyes: Negative.   Respiratory: Negative.   Cardiovascular: Negative.   Endocrine: Negative.   Musculoskeletal: Negative.   Neurological: Negative.    Hematological: Negative.   Psychiatric/Behavioral: Negative.   All other systems reviewed and are negative.    Objective: Vital Signs: Ht 5\' 2"  (1.575 m)   Wt 233 lb (105.7 kg)   BMI 42.62 kg/m   Physical Exam Vitals signs and nursing note reviewed.  Constitutional:      Appearance: She is well-developed.  Pulmonary:     Effort: Pulmonary effort is normal.  Skin:    General: Skin is warm.     Capillary Refill: Capillary refill takes less than 2 seconds.  Neurological:     Mental Status: She is alert and oriented to person, place, and time.  Psychiatric:        Behavior: Behavior normal.        Thought Content: Thought content normal.        Judgment: Judgment normal.     Ortho Exam Right knee exam shows couple areas of blistered skin on the anterior aspect of the knee.  There is no cellulitis or signs of infection.  No joint effusion.  This is quite superficial. Specialty Comments:  No specialty comments available.  Imaging: No results found.   PMFS History: Patient Active Problem List   Diagnosis Date Noted  .  Diabetic ulcer of toe of left foot associated with type 2 diabetes mellitus, limited to breakdown of skin (HCC) 08/08/2017  . 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 Charcot foot due to diabetes mellitus (HCC) 07/13/2017  . OSA (obstructive sleep apnea) 05/24/2017  . Sleep related hypoxia 05/24/2017  . Unilateral primary osteoarthritis, left knee 05/09/2017  . Pulmonary hypertension, low resistance (HCC) 04/06/2017  . At risk for obstructive sleep apnea 04/06/2017  . Idiopathic chronic venous hypertension of both lower extremities with inflammation 03/21/2017  . 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 Influenza with respiratory manifestation 12/28/2016  . Acute kidney injury (HCC) 12/28/2016  . CKD (chronic kidney disease) stage 3, GFR 30-59 ml/min 12/28/2016  . Hypoxia 12/27/2016  .  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 Essential hypertension 07/06/2015  . Fibromyalgia 07/06/2015  . Dyspnea 07/04/2015  . Diabetic neuropathy (HCC) 12/10/2014   Past Medical History:  Diagnosis Date  . Arthritis    Osteoarthritis right calcaneous  . Carpal tunnel syndrome   . Charcot ankle, right   . Chronic kidney disease    patient unaware- does not see a nephrologist  . Chronic pain   . Complication of anesthesia   . Depression   . Diabetes mellitus    Tyoe II  . Diabetic neuropathy (HCC)   . Fibromyalgia   . GERD (gastroesophageal reflux disease)   . Headache   . Hernia   . History of blood transfusion    after miscarriage  . Hyperlipemia   . Hypertension   . Miscarriage    twins  . Peripheral neuropathy   . PONV (postoperative nausea and vomiting)    no problem with medication  . TMJ syndrome     Family History  Problem Relation Age of Onset  . Aneurysm Father        Deceased, 64  . CAD Father   . Heart disease Father   . Cancer Father   . Pancreatic cancer Mother        Deceased, 58    Past Surgical History:  Procedure Laterality Date  . ABDOMINAL SURGERY    . AMPUTATION Right 07/15/2017   Procedure: RIGHT BELOW KNEE AMPUTATION;  Surgeon: Nadara Mustard, MD;  Location: [facility] OR;  Service: Orthopedics;  Laterality: Right;  . BUNIONECTOMY Bilateral    2 separate surgies  . COLONOSCOPY    . PARTIAL HYSTERECTOMY  1983  . TMJ ARTHROPLASTY     left side x 2  . TONSILLECTOMY    . TUBAL LIGATION  1983  . Umbilical hernia     x2  . VAGINAL HYSTERECTOMY  2002   Social History   Occupational History  . Occupation: disabled  Tobacco Use  . Smoking status: Current Every Day Smoker    Packs/day: 1.00    Years: 32.00    Pack years: 32.00    Types: Cigarettes    Last attempt to quit: 11/15/2008    Years since quitting: 10.7  . Smokeless tobacco: Never Used  Substance and Sexual Activity  . Alcohol use: No     Alcohol/week: 0.0 standard drinks  . Drug use: No  . Sexual activity: Not on file

## 2019-10-09 ENCOUNTER — Telehealth: Payer: Self-pay | Admitting: Orthopaedic Surgery

## 2019-10-09 ENCOUNTER — Telehealth: Payer: Self-pay | Admitting: Orthopedic Surgery

## 2019-10-09 NOTE — Telephone Encounter (Signed)
Patient left a voicemail message requesting that an RX for a new socket be sent to [place] [facility].  Patient's CB#[phone].  Thank you.

## 2019-10-10 NOTE — Telephone Encounter (Signed)
Patient was called and informed Rx will be faxed to [facility]

## 2020-02-21 ENCOUNTER — Telehealth: Payer: Self-pay | Admitting: Neurology

## 2020-02-21 NOTE — Telephone Encounter (Signed)
Patient called to report a new symptom of "tremors and loss of strength" in her hands that has been going on for several days. She'd like to see Dr. Allena Katz about this.

## 2020-02-21 NOTE — Telephone Encounter (Signed)
Please advise on Friday. 

## 2020-02-22 NOTE — Telephone Encounter (Signed)
I do not have anything immediately available, she may want to see her PCP about this first.  Next available in office would be 4/26, but this may change, if I need to take my leave early.

## 2020-02-27 NOTE — Telephone Encounter (Signed)
Patient expressed understanding and will reach out to her PCP for an appointment.

## 2020-04-03 ENCOUNTER — Emergency Department (HOSPITAL_COMMUNITY)
Admission: EM | Admit: 2020-04-03 | Discharge: 2020-04-03 | Disposition: A | Discharge status: Home / Self Care | Payer: Commercial Managed Care - PPO | Attending: Emergency Medicine | Admitting: Emergency Medicine

## 2020-04-03 ENCOUNTER — Encounter (HOSPITAL_COMMUNITY): Payer: Self-pay

## 2020-04-03 ENCOUNTER — Other Ambulatory Visit: Payer: Self-pay

## 2020-04-03 DIAGNOSIS — R7989 Other specified abnormal findings of blood chemistry: Secondary | ICD-10-CM | POA: Diagnosis not present

## 2020-04-03 DIAGNOSIS — Z79899 Other long term (current) drug therapy: Secondary | ICD-10-CM | POA: Insufficient documentation

## 2020-04-03 DIAGNOSIS — N183 Chronic kidney disease, stage 3 unspecified: Secondary | ICD-10-CM | POA: Diagnosis not present

## 2020-04-03 DIAGNOSIS — Z7984 Long term (current) use of oral hypoglycemic drugs: Secondary | ICD-10-CM | POA: Diagnosis not present

## 2020-04-03 DIAGNOSIS — I129 Hypertensive chronic kidney disease with stage 1 through stage 4 chronic kidney disease, or unspecified chronic kidney disease: Secondary | ICD-10-CM | POA: Diagnosis not present

## 2020-04-03 DIAGNOSIS — E875 Hyperkalemia: Secondary | ICD-10-CM | POA: Diagnosis not present

## 2020-04-03 DIAGNOSIS — E1122 Type 2 diabetes mellitus with diabetic chronic kidney disease: Secondary | ICD-10-CM | POA: Diagnosis not present

## 2020-04-03 DIAGNOSIS — F17210 Nicotine dependence, cigarettes, uncomplicated: Secondary | ICD-10-CM | POA: Insufficient documentation

## 2020-04-03 LAB — BASIC METABOLIC PANEL
Anion gap: 9 (ref 5–15)
Anion gap: 8 (ref 5–15)
BUN: 28 mg/dL — ABNORMAL HIGH (ref 8–23)
BUN: 27 mg/dL — ABNORMAL HIGH (ref 8–23)
CO2: 25 mmol/L (ref 22–32)
CO2: 27 mmol/L (ref 22–32)
Calcium: 9.6 mg/dL (ref 8.9–10.3)
Calcium: 9.4 mg/dL (ref 8.9–10.3)
Chloride: 105 mmol/L (ref 98–111)
Chloride: 102 mmol/L (ref 98–111)
Creatinine, Ser: 1.75 mg/dL — ABNORMAL HIGH (ref 0.44–1.00)
Creatinine, Ser: 1.69 mg/dL — ABNORMAL HIGH (ref 0.44–1.00)
GFR calc Af Amer: 36 mL/min — ABNORMAL LOW (ref >60)
GFR calc Af Amer: 37 mL/min — ABNORMAL LOW (ref >60)
GFR calc non Af Amer: 31 mL/min — ABNORMAL LOW (ref >60)
GFR calc non Af Amer: 32 mL/min — ABNORMAL LOW (ref >60)
Glucose, Bld: 95 mg/dL (ref 70–99)
Glucose, Bld: 89 mg/dL (ref 70–99)
Potassium: 5.9 mmol/L — ABNORMAL HIGH (ref 3.5–5.1)
Potassium: 5.5 mmol/L — ABNORMAL HIGH (ref 3.5–5.1)
Sodium: 139 mmol/L (ref 135–145)
Sodium: 137 mmol/L (ref 135–145)

## 2020-04-03 LAB — CBC
HCT: 37.7 % (ref 36.0–46.0)
Hemoglobin: 11.9 g/dL — ABNORMAL LOW (ref 12.0–15.0)
MCH: 30.1 pg (ref 26.0–34.0)
MCHC: 31.6 g/dL (ref 30.0–36.0)
MCV: 95.4 fL (ref 80.0–100.0)
Platelets: 165 10*3/uL (ref 150–400)
RBC: 3.95 MIL/uL (ref 3.87–5.11)
RDW: 14.7 % (ref 11.5–15.5)
WBC: 7.6 10*3/uL (ref 4.0–10.5)
nRBC: 0 % (ref 0.0–0.2)

## 2020-04-03 LAB — TROPONIN I (HIGH SENSITIVITY)
Troponin I (High Sensitivity): 2 ng/L (ref <18)
Troponin I (High Sensitivity): <2 ng/L (ref <18)

## 2020-04-03 LAB — CK: Total CK: 74 U/L (ref 38–234)

## 2020-04-03 MED ORDER — SODIUM CHLORIDE 0.9% FLUSH: 3.0000 mL | Freq: Once | INTRAVENOUS | Status: DC

## 2020-04-03 MED ORDER — SODIUM CHLORIDE 0.9 % IV BOLUS
500.0000 mL | Freq: Once | INTRAVENOUS | Status: AC
  Administered 2020-04-03: 500 mL via INTRAVENOUS

## 2020-04-03 MED ORDER — SODIUM ZIRCONIUM CYCLOSILICATE 5 G PO PACK
5.0000 g | PACK | Freq: Once | ORAL | Status: AC
  Administered 2020-04-03: 5 g via ORAL
  Filled 2020-04-03: qty 1

## 2020-04-03 NOTE — Discharge Instructions (Addendum)
Your kidney function was mildly increased from your prior values.  Your potassium is elevated.  It has improved with treatment in the ED.  You need to follow-up tomorrow with your primary care doctor.  Return here as needed if you have any worsening symptoms.

## 2020-04-03 NOTE — ED Triage Notes (Signed)
Pt sent from PCP for AKI w/ hyperkalemia K+ 6.0. Pt denies SOB/chest pain

## 2020-04-03 NOTE — ED Notes (Signed)
Discharge instructions discussed with pt and husband at bedside. Pt was able to verbalize understanding with no questions at this time. Pt to go home with spouse at bedside.

## 2020-04-03 NOTE — ED Provider Notes (Signed)
[facility] EMERGENCY DEPARTMENT Provider Note   CSN: [id number] Arrival date & time: 04/03/20  1135     History Chief Complaint  Patient presents with  . Abnormal Lab    Erin [name] is a 64 y.o. female.  Patient is a 64 year old female with a history of diabetes, chronic pain, chronic kidney disease and hypertension who presents with abnormal blood work.  She was seen by her PCP in [facility] who did routine blood work for her physical and noted that her creatinine was elevated to 2.0 and her potassium was elevated to 6.4.  This was done 2 days ago.  She was sent here for further evaluation.  She denies any symptoms.  She denies any shortness of breath.  No chest pain.  No leg swelling.  No increased fatigue.  She says she has been urinating normally.        Past Medical History:  Diagnosis Date  . Arthritis    Osteoarthritis right calcaneous  . Carpal tunnel syndrome   . Charcot ankle, right   . Chronic kidney disease    patient unaware- does not see a nephrologist  . Chronic pain   . Complication of anesthesia   . Depression   . Diabetes mellitus    Tyoe II  . Diabetic neuropathy ([place])   . Fibromyalgia   . GERD (gastroesophageal reflux disease)   . Headache   . Hernia   . History of blood transfusion    after miscarriage  . Hyperlipemia   . Hypertension   . Miscarriage    twins  . Peripheral neuropathy   . PONV (postoperative nausea and vomiting)    no problem with medication  . TMJ syndrome     Patient Active Problem List   Diagnosis Date Noted  . Diabetic ulcer of toe of left foot associated with type 2 diabetes mellitus, limited to breakdown of skin ([place]) 08/08/2017  . Amputation of right lower extremity below knee ([place]) 07/15/2017  . Charcot foot due to diabetes mellitus ([place]) 07/13/2017  . OSA (obstructive sleep apnea) 05/24/2017  . Sleep related hypoxia 05/24/2017  . Unilateral primary osteoarthritis, left knee 05/09/2017  .  Pulmonary hypertension, low resistance ([place]) 04/06/2017  . At risk for obstructive sleep apnea 04/06/2017  . Idiopathic chronic venous hypertension of both lower extremities with inflammation 03/21/2017  . 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 Influenza with respiratory manifestation 12/28/2016  . Acute kidney injury ([place]) 12/28/2016  . CKD (chronic kidney disease) stage 3, GFR 30-59 ml/min 12/28/2016  . Hypoxia 12/27/2016  . Atherosclerotic peripheral vascular disease with ulceration ([place]) 07/07/2015  . Essential hypertension 07/06/2015  . Fibromyalgia 07/06/2015  . Dyspnea 07/04/2015  . Diabetic neuropathy ([place]) 12/10/2014    Past Surgical History:  Procedure Laterality Date  . ABDOMINAL SURGERY    . AMPUTATION Right 07/15/2017   Procedure: RIGHT BELOW KNEE AMPUTATION;  Surgeon: Newt Minion, MD;  Location: [facility];  Service: Orthopedics;  Laterality: Right;  . BUNIONECTOMY Bilateral    2 separate surgies  . COLONOSCOPY    . PARTIAL HYSTERECTOMY  1983  . TMJ ARTHROPLASTY     left side x 2  . TONSILLECTOMY    . TUBAL LIGATION  1983  . Umbilical hernia     x2  . VAGINAL HYSTERECTOMY  2002     OB History   No obstetric history on file.     Family History  Problem Relation  Age of Onset  . Aneurysm Father        Deceased, 6  . CAD Father   . Heart disease Father   . Cancer Father   . Pancreatic cancer Mother        Deceased, 5    Social History   Tobacco Use  . Smoking status: Current Every Day Smoker    Packs/day: 1.00    Years: 32.00    Pack years: 32.00    Types: Cigarettes    Last attempt to quit: 11/15/2008    Years since quitting: 11.3  . Smokeless tobacco: Never Used  Substance Use Topics  . Alcohol use: No    Alcohol/week: 0.0 standard drinks  . Drug use: No    Home Medications Prior to Admission medications   Medication Sig Start Date End Date Taking? Authorizing Provider  Albuterol Sulfate 108 (90 Base)  MCG/ACT AEPB Inhale 2 puffs into the lungs every 6 (six) hours as needed (for wheezing). 12/28/16   Hyacinth Meeker, MD  allopurinol (ZYLOPRIM) 100 MG tablet Take 1 tablet (100 mg total) by mouth 2 (two) times daily. Take 2 tablets by mouth 2 times daily. 02/18/17   Adonis Huguenin, NP  benazepril-hydrochlorthiazide (LOTENSIN HCT) 20-25 MG per tablet Take 1 tablet by mouth daily.    [provider]  Calcium Carbonate (CALCIUM 600 PO) Take by mouth daily.    [provider]  celecoxib (CELEBREX) 200 MG capsule Take 200 mg by mouth daily.    Karle Plumber, MD  colchicine 0.6 MG tablet Take 1 tablet (0.6 mg total) by mouth 2 (two) times daily. Twice daily until gout flare resolves Patient taking differently: Take 0.6 mg by mouth daily as needed.  11/03/16   Nadara Mustard, MD  DULoxetine (CYMBALTA) 60 MG capsule Take 60 mg by mouth daily.    Karle Plumber, MD  ferrous sulfate 325 (65 FE) MG tablet Take 325 mg by mouth daily with breakfast.    [provider]  furosemide (LASIX) 40 MG tablet Take 40 mg by mouth as needed.     [provider]  ibuprofen (ADVIL,MOTRIN) 200 MG tablet Take 200 mg by mouth every 6 (six) hours as needed.    [provider]  levothyroxine (SYNTHROID, LEVOTHROID) 50 MCG tablet Take 50 mcg by mouth daily before breakfast.  08/15/15   [provider]  metFORMIN (GLUCOPHAGE) 500 MG tablet  03/24/18   [provider]  metoCLOPramide (REGLAN) 10 MG tablet Take 10 mg by mouth 4 (four) times daily.    [provider]  mupirocin ointment (BACTROBAN) 2 % Apply 1 application topically 2 (two) times daily. 08/17/19   Tarry Kos, MD  oxyCODONE (ROXICODONE) 30 MG immediate release tablet Take 2 tablets (60 mg total) by mouth every 6 (six) hours as needed for severe pain. 07/17/17   Nadara Mustard, MD  pregabalin (LYRICA) 200 MG capsule Take 200 mg by mouth 3 (three) times daily.    Karle Plumber, MD  promethazine  (PHENERGAN) 25 MG tablet  05/02/18   [provider]  SUMAtriptan (IMITREX) 100 MG tablet Take 100 mg by mouth every 2 (two) hours as needed for migraine.  08/15/15   [provider]  VITAMIN D PO Take 2,000 Units by mouth.    [provider]    Allergies    Patient has no known allergies.  Review of Systems   Review of Systems  Constitutional: Negative for  chills, diaphoresis, fatigue and fever.  HENT: Negative for congestion, rhinorrhea and sneezing.   Eyes: Negative.   Respiratory: Negative for cough, chest tightness and shortness of breath.   Cardiovascular: Negative for chest pain and leg swelling.  Gastrointestinal: Negative for abdominal pain, blood in stool, diarrhea, nausea and vomiting.  Genitourinary: Negative for difficulty urinating, flank pain, frequency and hematuria.  Musculoskeletal: Negative for arthralgias and back pain.  Skin: Negative for rash.  Neurological: Negative for dizziness, speech difficulty, weakness, numbness and headaches.    Physical Exam Updated Vital Signs BP (!) 112/51   Pulse (!) 57   Temp 98.2 F (36.8 C) (Oral)   Resp 15   SpO2 98%   Physical Exam Constitutional:      Appearance: She is well-developed.  HENT:     Head: Normocephalic and atraumatic.  Eyes:     Pupils: Pupils are equal, round, and reactive to light.  Cardiovascular:     Rate and Rhythm: Normal rate and regular rhythm.     Heart sounds: Normal heart sounds.  Pulmonary:     Effort: Pulmonary effort is normal. No respiratory distress.     Breath sounds: Normal breath sounds. No wheezing or rales.  Chest:     Chest wall: No tenderness.  Abdominal:     General: Bowel sounds are normal.     Palpations: Abdomen is soft.     Tenderness: There is no abdominal tenderness. There is no guarding or rebound.  Musculoskeletal:        General: Normal range of motion.     Cervical back: Normal range of motion and neck supple.     Comments: Left lower  leg amputation, no significant edema noted to left leg  Lymphadenopathy:     Cervical: No cervical adenopathy.  Skin:    General: Skin is warm and dry.     Findings: No rash.  Neurological:     Mental Status: She is alert and oriented to person, place, and time.     ED Results / Procedures / Treatments   Labs (all labs ordered are listed, but only abnormal results are displayed) Labs Reviewed  BASIC METABOLIC PANEL - Abnormal; Notable for the following components:      Result Value   Potassium 5.9 (*)    BUN 28 (*)    Creatinine, Ser 1.75 (*)    GFR calc non Af Amer 31 (*)    GFR calc Af Amer 36 (*)    All other components within normal limits  CBC - Abnormal; Notable for the following components:   Hemoglobin 11.9 (*)    All other components within normal limits  BASIC METABOLIC PANEL - Abnormal; Notable for the following components:   Potassium 5.5 (*)    BUN 27 (*)    Creatinine, Ser 1.69 (*)    GFR calc non Af Amer 32 (*)    GFR calc Af Amer 37 (*)    All other components within normal limits  CK  TROPONIN I (HIGH SENSITIVITY)  TROPONIN I (HIGH SENSITIVITY)    EKG EKG Interpretation  Date/Time:  Thursday Apr 03 2020 11:43:46 EDT Ventricular Rate:  80 PR Interval:  202 QRS Duration: 76 QT Interval:  348 QTC Calculation: 401 R Axis:   36 Text Interpretation: Normal sinus rhythm Low voltage QRS Borderline ECG since last tracing no significant change Confirmed by Rolan Bucco [phone]) on 04/03/2020 4:34:57 PM   Radiology No results found.  Procedures Procedures (including critical care  time)  Medications Ordered in ED Medications  sodium chloride flush (NS) 0.9 % injection 3 mL (3 mLs Intravenous Not Given 04/03/20 1916)  sodium chloride 0.9 % bolus 500 mL (0 mLs Intravenous Stopped 04/03/20 1724)  sodium zirconium cyclosilicate (LOKELMA) packet 5 g (5 g Oral Given 04/03/20 1841)  sodium chloride 0.9 % bolus 500 mL (500 mLs Intravenous New Bag/Given 04/03/20  1828)    ED Course  I have reviewed the triage vital signs and the nursing notes.  Pertinent labs & imaging results that were available during my care of the patient were reviewed by me and considered in my medical decision making (see chart for details).    MDM Rules/Calculators/A&P                      Patient is a 64 year old female who presents with abnormal labs.  Per her PCP, her creatinine was noted to be 2.0 and a potassium was 6.4.  This was on Tuesday which was 2 days ago.  Her labs today show a creatinine of 1.75 and a potassium of 5.9.  On chart review, her last creatinines in our system from 2018 were 1.99 and 1.91.  I spoke with her PCP who is located in [facility].  They state that her creatinine a year ago was 1.0.  She was given IV fluids in the ED.  With this her potassium improved to 5.5 and her creatinine improved to 1.65.  She has no evidence of rhabdomyolysis.  No EKG changes.  She was given 1 dose of Lokelma.  She is completely asymptomatic.  In conversation with her PCP, they have advised that they can follow her up tomorrow in the office and go through her medications to see if they need to change any of her medications that may be leading to her hyperkalemia and arrange for repeat labs.  I feel that she is appropriate for discharge.  She was discharged home in good condition.  Return precautions were given. Final Clinical Impression(s) / ED Diagnoses Final diagnoses:  Hyperkalemia  Elevated serum creatinine    Rx / DC Orders ED Discharge Orders    None       Rolan Bucco, MD 04/03/20 [phone]

## 2020-04-03 NOTE — ED Notes (Signed)
Patient states she had blood work drawn on Tues at her PCP and was called this am and told to come to the ED for further eval. Patient denies any complaints.

## 2020-04-23 ENCOUNTER — Telehealth: Payer: Self-pay | Admitting: Orthopedic Surgery

## 2020-04-23 NOTE — Telephone Encounter (Signed)
Can you please do this? 

## 2020-04-23 NOTE — Telephone Encounter (Signed)
Patient called.   Needs a rx for sheathes sent to [facility]   Call back: [phone]

## 2022-05-20 ENCOUNTER — Encounter (HOSPITAL_BASED_OUTPATIENT_CLINIC_OR_DEPARTMENT_OTHER): Payer: Medicare Other | Attending: General Surgery | Admitting: General Surgery

## 2022-05-20 DIAGNOSIS — I872 Venous insufficiency (chronic) (peripheral): Secondary | ICD-10-CM | POA: Diagnosis present

## 2022-05-20 DIAGNOSIS — F17210 Nicotine dependence, cigarettes, uncomplicated: Secondary | ICD-10-CM | POA: Insufficient documentation

## 2022-05-20 DIAGNOSIS — E11622 Type 2 diabetes mellitus with other skin ulcer: Secondary | ICD-10-CM | POA: Insufficient documentation

## 2022-05-20 DIAGNOSIS — I272 Pulmonary hypertension, unspecified: Secondary | ICD-10-CM | POA: Diagnosis not present

## 2022-05-20 DIAGNOSIS — L97822 Non-pressure chronic ulcer of other part of left lower leg with fat layer exposed: Secondary | ICD-10-CM | POA: Insufficient documentation

## 2022-05-20 DIAGNOSIS — I10 Essential (primary) hypertension: Secondary | ICD-10-CM | POA: Insufficient documentation

## 2022-05-20 DIAGNOSIS — I83029 Varicose veins of left lower extremity with ulcer of unspecified site: Secondary | ICD-10-CM | POA: Insufficient documentation

## 2022-05-20 DIAGNOSIS — Z6841 Body Mass Index (BMI) 40.0 and over, adult: Secondary | ICD-10-CM | POA: Insufficient documentation

## 2022-05-20 DIAGNOSIS — E669 Obesity, unspecified: Secondary | ICD-10-CM | POA: Insufficient documentation

## 2022-05-20 DIAGNOSIS — Z09 Encounter for follow-up examination after completed treatment for conditions other than malignant neoplasm: Secondary | ICD-10-CM | POA: Diagnosis not present

## 2022-05-20 DIAGNOSIS — Z89511 Acquired absence of right leg below knee: Secondary | ICD-10-CM | POA: Diagnosis not present

## 2022-05-20 NOTE — Progress Notes (Signed)
Erin [name], Erin [name] ([id number]) Visit Report for 05/20/2022 Abuse Risk Screen Details Patient Name: Date of Service: BLA CKA RD, CA RO L F. 05/20/2022 9:00 A M Medical Record Number: [id number] Patient Account Number: [id number] Date of Birth/Sex: Treating RN: 19-Jul-1956 (66 y.o. Tommye Standard Primary Care Rishita Petron: Samuel Jester Other Clinician: Referring Glada Wickstrom: Treating Dimitri Shakespeare/Extender: Sharon Mt in Treatment: 0 Abuse Risk Screen Items Answer ABUSE RISK SCREEN: Has anyone close to you tried to hurt or harm you recentlyo No Do you feel uncomfortable with anyone in your familyo No Has anyone forced you do things that you didnt want to doo No Electronic Signature(s) Signed: 05/20/2022 5:06:25 PM By: Zenaida Deed RN, BSN Entered By: Zenaida Deed on 05/20/2022 09:21:10 -------------------------------------------------------------------------------- Activities of Daily Living Details Patient Name: Date of Service: BLA CKA RD, CA RO L F. 05/20/2022 9:00 A M Medical Record Number: [id number] Patient Account Number: [id number] Date of Birth/Sex: Treating RN: [birth year] (66 y.o. Tommye Standard Primary Care Ami Mally: Samuel Jester Other Clinician: Referring Jadesola Poynter: Treating Gearld Kerstein/Extender: Sharon Mt in Treatment: 0 Activities of Daily Living Items Answer Activities of Daily Living (Please select one for each item) Drive Automobile Not Able T Medications ake Completely Able Use T elephone Completely Able Care for Appearance Completely Able Use T oilet Completely Able Bath / Shower Completely Able Dress Self Completely Able Feed Self Completely Able Walk Completely Able Get In / Out Bed Completely Able Housework Completely Able Prepare Meals Completely Able Handle Money Completely Able Shop for Self Completely Able Electronic Signature(s) Signed: 05/20/2022 5:06:25 PM By: Zenaida Deed RN, BSN Entered  By: Zenaida Deed on 05/20/2022 09:21:58 -------------------------------------------------------------------------------- Education Screening Details Patient Name: Date of Service: BLA CKA RD, CA RO L F. 05/20/2022 9:00 A M Medical Record Number: [id number] Patient Account Number: [id number] Date of Birth/Sex: Treating RN: Apr 12, 1956 (66 y.o. Tommye Standard Primary Care Gaylin Osoria: Samuel Jester Other Clinician: Referring Jjesus Dingley: Treating Odilia Damico/Extender: Sharon Mt in Treatment: 0 Primary Learner Assessed: Patient Learning Preferences/Education Level/Primary Language Learning Preference: Explanation, Demonstration, Printed Material Highest Education Level: High School Preferred Language: English Cognitive Barrier Language Barrier: No Translator Needed: No Memory Deficit: No Emotional Barrier: No Cultural/Religious Beliefs Affecting Medical Care: No Physical Barrier Impaired Vision: No Impaired Hearing: No Decreased Hand dexterity: No Knowledge/Comprehension Knowledge Level: High Comprehension Level: High Ability to understand written instructions: High Ability to understand verbal instructions: High Motivation Anxiety Level: Calm Cooperation: Cooperative Education Importance: Acknowledges Need Interest in Health Problems: Asks Questions Perception: Coherent Willingness to Engage in Self-Management High Activities: Readiness to Engage in Self-Management High Activities: Electronic Signature(s) Signed: 05/20/2022 5:06:25 PM By: Zenaida Deed RN, BSN Entered By: Zenaida Deed on 05/20/2022 09:22:32 -------------------------------------------------------------------------------- Fall Risk Assessment Details Patient Name: Date of Service: BLA CKA RD, CA RO L F. 05/20/2022 9:00 A M Medical Record Number: [id number] Patient Account Number: [id number] Date of Birth/Sex: Treating RN: 07/11/1956 (66 y.o. Tommye Standard Primary Care  Domenic Schoenberger: Samuel Jester Other Clinician: Referring Sarae Nicholes: Treating Carlisia Geno/Extender: Sharon Mt in Treatment: 0 Fall Risk Assessment Items Have you had 2 or more falls in the last 12 monthso 0 Yes Have you had any fall that resulted in injury in the last 12 monthso 0 No FALLS RISK SCREEN History of falling - immediate or within 3 months 25 Yes Secondary diagnosis (Do you have 2 or more medical diagnoseso) 0 No Ambulatory aid None/bed rest/wheelchair/nurse 0 No Crutches/cane/walker 0 No Furniture 0 No Intravenous  therapy Access/Saline/Heparin Lock 0 No Gait/Transferring Normal/ bed rest/ wheelchair 0 No Weak (short steps with or without shuffle, stooped but able to lift head while walking, may seek 10 Yes support from furniture) Impaired (short steps with shuffle, may have difficulty arising from chair, head down, impaired 0 No balance) Mental Status Oriented to own ability 0 Yes Electronic Signature(s) Signed: 05/20/2022 5:06:25 PM By: Zenaida Deed RN, BSN Entered By: Zenaida Deed on 05/20/2022 09:23:08 -------------------------------------------------------------------------------- Foot Assessment Details Patient Name: Date of Service: BLA CKA RD, CA RO L F. 05/20/2022 9:00 A M Medical Record Number: [id number] Patient Account Number: [id number] Date of Birth/Sex: Treating RN: 1956-03-04 (66 y.o. 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 Site Locations + = Sensation present, - = Sensation absent, C = Callus, U = Ulcer R = Redness, W = Warmth, M = Maceration, PU = Pre-ulcerative lesion F = Fissure, S = Swelling, D = Dryness Assessment Right: Left: Other Deformity: No Prior Foot Ulcer: No Prior Amputation:  No Charcot Joint: No Ambulatory Status: Ambulatory With Help Assistance Device: Cane Gait: Steady Electronic Signature(s) Signed: 05/20/2022 5:06:25 PM By: 07/21/2022 RN, BSN Entered By: Zenaida Deed on 05/20/2022 09:25:17 -------------------------------------------------------------------------------- Nutrition Risk Screening Details Patient Name: Date of Service: BLA CKA RD, CA RO L F. 05/20/2022 9:00 A M Medical Record Number: 07/21/2022 Patient Account Number: [id number] Date of Birth/Sex: Treating RN: 10/22/1956 (66 y.o. 79 Primary Care Znya Albino: Tommye Standard Other Clinician: Referring Smt. Loder: Treating Delta Deshmukh/Extender: Samuel Jester in Treatment: 0 Height (in): 63 Weight (lbs): 233 Body Mass Index (BMI): 41.3 Nutrition Risk Screening Items Score Screening NUTRITION RISK SCREEN: I have an illness or condition that made me change the kind and/or amount of food I eat 0 No I eat fewer than two meals per day 0 No I eat few fruits and vegetables, or milk products 0 No I have three or more drinks of beer, liquor or wine almost every day 0 No I have tooth or mouth problems that make it hard for me to eat 0 No I don't always have enough money to buy the food I need 0 No I eat alone most of the time 0 No I take three or more different prescribed or over-the-counter drugs a day 1 Yes Without wanting to, I have lost or gained 10 pounds in the last six months 0 No I am not always physically able to shop, cook and/or feed myself 0 No Nutrition Protocols Good Risk Protocol 0 No interventions needed Moderate Risk Protocol High Risk Proctocol Risk Level: Good Risk Score: 1 Electronic Signature(s) Signed: 05/20/2022 5:06:25 PM By: 07/21/2022 RN, BSN Entered By: Zenaida Deed on 05/20/2022 09:23:35

## 2022-05-20 NOTE — Progress Notes (Addendum)
KEYRIN, WATWOOD ([id number]) Visit Report for 05/20/2022 Chief Complaint Document Details Patient Name: Date of Service: BLA CKA RD, CA RO L F. 05/20/2022 9:00 A M Medical Record Number: [id number] Patient Account Number: [id number] Date of Birth/Sex: Treating RN: 1956-05-23 (66 y.o. Elam Dutch Primary Care Provider: Octavio Graves Other Clinician: Referring Provider: Treating Provider/Extender: Rosetta Posner in Treatment: 0 Information Obtained from: Patient Chief Complaint Patient presents for treatment of an open ulcer due to venous insufficiency Electronic Signature(s) Signed: 05/20/2022 9:53:25 AM By: [name] MD FACS Previous Signature: 05/20/2022 9:27:36 AM Version By: [name] MD FACS Entered By: [name] on 05/20/2022 09:53:25 -------------------------------------------------------------------------------- Debridement Details Patient Name: Date of Service: BLA CKA RD, CA RO L F. 05/20/2022 9:00 A M Medical Record Number: [id number] Patient Account Number: [id number] Date of Birth/Sex: Treating RN: 1955/12/10 (67 y.o. Martyn Malay, Linda Primary Care Provider: Octavio Graves Other Clinician: Referring Provider: Treating Provider/Extender: Rosetta Posner in Treatment: 0 Debridement Performed for Assessment: Wound #1 Left,Proximal,Anterior Lower Leg Performed By: Physician [name], MD Debridement Type: Debridement Level of Consciousness (Pre-procedure): Awake and Alert Pre-procedure Verification/Time Out Yes - 09:45 Taken: Start Time: 09:46 Pain Control: Lidocaine 4% T opical Solution T Area Debrided (L x W): otal 1.5 (cm) x 2.1 (cm) = 3.15 (cm) Tissue and other material debrided: Viable, Non-Viable, Slough, Subcutaneous, Slough Level: Skin/Subcutaneous Tissue Debridement Description: Excisional Instrument: Curette Bleeding: Minimum Hemostasis Achieved: Pressure Procedural Pain:  2 Post Procedural Pain: 1 Response to Treatment: Procedure was tolerated well Level of Consciousness (Post- Awake and Alert procedure): Post Debridement Measurements of Total Wound Length: (cm) 1.5 Width: (cm) 2.1 Depth: (cm) 0.1 Volume: (cm) 0.247 Character of Wound/Ulcer Post Debridement: Improved Post Procedure Diagnosis Same as Pre-procedure Electronic Signature(s) Signed: 05/20/2022 12:58:53 PM By: [name] MD FACS Signed: 05/20/2022 5:06:25 PM By: Baruch Gouty RN, BSN Entered By: Baruch Gouty on 05/20/2022 09:52:31 -------------------------------------------------------------------------------- HPI Details Patient Name: Date of Service: BLA CKA RD, CA RO L F. 05/20/2022 9:00 A M Medical Record Number: [id number] Patient Account Number: [id number] Date of Birth/Sex: Treating RN: [birth year] (66 y.o. Elam Dutch Primary Care Provider: Octavio Graves Other Clinician: Referring Provider: Treating Provider/Extender: Rosetta Posner in Treatment: 0 History of Present Illness HPI Description: ADMISSION This is a 66 year old obese diabetic patient who has a history of right below-knee amputation. She has venous insufficiency on the left lower leg. It has been present for about 8 weeks. She presented to her primary care provider's office on May 13, 2022 with an increase in redness, oozing, and bleeding. At the time, she was not covering the wound. She was prescribed a 10-day course of Bactrim and referred to the wound care center for further evaluation and management. I do not have a recent hemoglobin A1c available for review; the last 1 available in her electronic medical record was 5.8, but this was over 5 years ago. The notes provided from her family doctor do not include this information. The patient reports that her last A1c was about 6 months ago and was 5.4. She does smoke about a pack of cigarettes daily. ABI in clinic was diminished at  0.85. On her left lower extremity, she has 2 ulcers on the anterior tibial surface, as well as another ulcer on the posterior calf. The posterior lesion is nearly epithelialized. The anterior lesions expose the fat layer. There is some slough accumulation in both sites. No odor or purulent drainage. She does have  about 1+ pitting edema. She does not wear compression stockings. Electronic Signature(s) Signed: 05/20/2022 9:54:46 AM By: [name] MD FACS Previous Signature: 05/20/2022 9:29:43 AM Version By: [name] MD FACS Entered By: [name] on 05/20/2022 09:54:46 -------------------------------------------------------------------------------- Physical Exam Details Patient Name: Date of Service: BLA CKA RD, CA RO L F. 05/20/2022 9:00 A M Medical Record Number: [id number] Patient Account Number: [id number] Date of Birth/Sex: Treating RN: 09-30-1956 (66 y.o. Elam Dutch Primary Care Provider: Octavio Graves Other Clinician: Referring Provider: Treating Provider/Extender: Rosetta Posner in Treatment: 0 Constitutional . . . . No acute distress. Respiratory Normal work of breathing on room air.. Cardiovascular Distal pulses nonpalpable. 1+ pitting edema to the remaining lower extremity.. Notes 05/20/2022: On her left lower extremity, she has 2 ulcers on the anterior tibial surface, as well as another ulcer on the posterior calf. The posterior lesion is nearly epithelialized. The anterior lesions expose the fat layer. There is some slough accumulation in both sites. No odor or purulent drainage. She does have about 1+ pitting edema. Electronic Signature(s) Signed: 05/20/2022 10:13:39 AM By: [name] MD FACS Previous Signature: 05/20/2022 9:55:55 AM Version By: [name] MD FACS Entered By: [name] on 05/20/2022 10:13:39 -------------------------------------------------------------------------------- Physician Orders  Details Patient Name: Date of Service: BLA CKA RD, CA RO L F. 05/20/2022 9:00 A M Medical Record Number: [id number] Patient Account Number: [id number] Date of Birth/Sex: Treating RN: [birth year] (66 y.o. 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 - Dr. Celine Ahr RM 1 Return A Friday 7/14 @ 2:45 pm Bathing/ Shower/ Hygiene May shower with protection but do not get wound dressing(s) wet. Edema Control - Lymphedema / SCD / Other Elevate legs to the level of the heart or above for 30 minutes daily and/or when sitting, a frequency of: - throughout the day Avoid standing for long periods of time. Exercise regularly Wound Treatment Wound #1 - Lower Leg Wound Laterality: Left, Anterior, Proximal Peri-Wound Care: Sween Lotion (Moisturizing lotion) 1 x Per Week/30 Days Discharge Instructions: Apply moisturizing lotion as directed Prim Dressing: KerraCel Ag Gelling Fiber Dressing, 2x2 in (silver alginate) 1 x Per Week/30 Days ary Discharge Instructions: Apply silver alginate to wound bed as instructed Secondary Dressing: Woven Gauze Sponge, Non-Sterile 4x4 in 1 x Per Week/30 Days Discharge Instructions: Apply over primary dressing as directed. Compression Wrap: Kerlix Roll 4.5x3.1 (in/yd) 1 x Per Week/30 Days Discharge Instructions: Apply  Kerlix and Coban compression as directed. Compression Wrap: Coban Self-Adherent Wrap 4x5 (in/yd) 1 x Per Week/30 Days Discharge Instructions: Apply over Kerlix as directed. Wound #2 - Lower Leg Wound Laterality: Left, Anterior, Distal Peri-Wound Care: Sween Lotion (Moisturizing lotion) 1 x Per Week/30 Days Discharge Instructions: Apply moisturizing lotion as directed Prim Dressing: KerraCel Ag Gelling Fiber Dressing, 2x2 in (silver alginate) 1 x Per Week/30 Days ary Discharge Instructions: Apply silver alginate to wound bed as instructed Secondary Dressing: Woven Gauze Sponge, Non-Sterile 4x4 in 1 x Per Week/30 Days Discharge Instructions: Apply  over primary dressing as directed. Compression Wrap: Kerlix Roll 4.5x3.1 (in/yd) 1 x Per Week/30 Days Discharge Instructions: Apply Kerlix and Coban compression as directed. Compression Wrap: Coban Self-Adherent Wrap 4x5 (in/yd) 1 x Per Week/30 Days Discharge Instructions: Apply over Kerlix as directed. Wound #3 - Lower Leg Wound Laterality: Left, Posterior Peri-Wound Care: Sween Lotion (Moisturizing lotion) 1 x Per Week/30 Days Discharge Instructions: Apply moisturizing lotion as directed Prim Dressing: KerraCel Ag Gelling Fiber Dressing, 2x2 in (silver alginate) 1 x Per Week/30 Days ary Discharge Instructions: Apply silver alginate to wound bed as instructed Secondary Dressing: Woven Gauze Sponge, Non-Sterile 4x4 in 1 x Per Week/30 Days Discharge Instructions: Apply over primary dressing as directed. Compression Wrap: Kerlix Roll 4.5x3.1 (in/yd) 1 x Per Week/30 Days Discharge Instructions: Apply Kerlix and Coban compression as directed. Compression Wrap: Coban Self-Adherent Wrap 4x5 (in/yd) 1 x Per Week/30 Days Discharge Instructions: Apply over Kerlix as directed. Services and Therapies rterial Studies with ABI's and TBIs- Unilateral left lower leg - left lower leg nonhealing ulcers and abnormal ABIs in clinic with claudication A Patient  Medications llergies: No Known Allergies A Notifications Medication Indication Start End prior to debridement 05/20/2022 lidocaine DOSE topical 4 % cream - cream topical Electronic Signature(s) Signed: 05/20/2022 12:58:53 PM By: [name] MD FACS Entered By: [name] on 05/20/2022 10:14:15 Prescription 05/20/2022 -------------------------------------------------------------------------------- Zeb Comfort. [name] MD Patient Name: Provider: [birth year] [id number] Date of Birth: NPI#: [id number] Sex: DEA #: [phone] [id number] Phone #: License #: [place] Patient Address: [address], [place] [address] [address], [place] [address] [phone] Allergies No Known Allergies Provider's Orders rterial Studies with ABI's and TBIs- Unilateral left lower leg - left lower leg nonhealing ulcers and abnormal ABIs in clinic with claudication A Hand Signature: Date(s): Electronic Signature(s) Signed: 05/20/2022 10:16:11 AM By: [name] MD FACS Entered By: [name] on 05/20/2022 10:16:11 -------------------------------------------------------------------------------- Problem List Details Patient Name: Date of Service: BLA CKA RD, CA RO L F. 05/20/2022 9:00 A M Medical Record Number: [id number] Patient Account Number: [id number] Date of Birth/Sex: Treating RN: Feb 14, 1956 (66 y.o. 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 05/20/2022 No Yes E11.622 Type 2 diabetes mellitus  with other skin ulcer 05/20/2022 No Yes I87.2 Venous insufficiency (chronic) (peripheral) 05/20/2022 No Yes I10 Essential (primary) hypertension 05/20/2022 No Yes I27.20 Pulmonary hypertension, unspecified 05/20/2022 No Yes Z89.511 Acquired absence of right leg below knee 05/20/2022 No Yes Inactive Problems Resolved Problems Electronic Signature(s) Signed: 05/20/2022 9:27:22 AM By: [name] MD FACS Previous Signature: 05/20/2022 9:07:23 AM Version By: [name] MD FACS Entered By: [name] on 05/20/2022 09:27:22 -------------------------------------------------------------------------------- Progress Note Details Patient Name: Date of Service: BLA CKA RD, CA RO L F. 05/20/2022 9:00 A M Medical Record Number: [id number] Patient Account Number: [id number] Date of Birth/Sex: Treating RN: 03-Jul-1956 (66 y.o. Elam Dutch Primary Care Provider: Octavio Graves Other Clinician: Referring Provider: Treating Provider/Extender: Rosetta Posner in Treatment: 0 Subjective Chief Complaint Information obtained from Patient Patient presents for treatment of an open ulcer due to venous insufficiency History of Present Illness (HPI) ADMISSION This is  a 66 year old obese diabetic patient who has a history of right below-knee amputation. She has venous insufficiency on the left lower leg. It has been present for about 8 weeks. She presented to her primary care provider's office on May 13, 2022 with an increase in redness, oozing, and bleeding. At the time, she was not covering the wound. She was prescribed a 10-day course of Bactrim and referred to the wound care center for further evaluation and management. I do not have a recent hemoglobin A1c available for review; the last 1 available in her electronic medical record was 5.8, but this was over 5 years ago. The notes provided from her family doctor do not include this information. The patient reports that her  last A1c was about 6 months ago and was 5.4. She does smoke about a pack of cigarettes daily. ABI in clinic was diminished at 0.85. On her left lower extremity, she has 2 ulcers on the anterior tibial surface, as well as another ulcer on the posterior calf. The posterior lesion is nearly epithelialized. The anterior lesions expose the fat layer. There is some slough accumulation in both sites. No odor or purulent drainage. She does have about 1+ pitting edema. She does not wear compression stockings. Patient History Information obtained from Patient, Chart. Allergies No Known Allergies Family History Cancer - Mother, Heart Disease - Father, Hypertension - Father, Stroke - Maternal Grandparents, No family history of Diabetes, Hereditary Spherocytosis, Kidney Disease, Lung Disease, Seizures, Thyroid Problems, Tuberculosis. Social History Current every day smoker - 1 ppd, Marital Status - Married, Alcohol Use - Never, Drug Use - No History, Caffeine Use - Daily - tea. Medical History Eyes Denies history of Cataracts, Glaucoma, Optic Neuritis Cardiovascular Patient has history of Hypertension, Peripheral Arterial Disease, Peripheral Venous Disease Endocrine Patient has history of Type II Diabetes Genitourinary Denies history of End Stage Renal Disease Integumentary (Skin) Denies history of History of Burn Musculoskeletal Patient has history of Gout - hx, Osteomyelitis - hx right foot Neurologic Patient has history of Neuropathy Oncologic Denies history of Received Chemotherapy, Received Radiation Psychiatric Denies history of Anorexia/bulimia, Confinement Anxiety Patient is treated with Oral Agents. Blood sugar is tested. Hospitalization/Surgery History - right BKA. - vaginal hysterectomy. - tubal ligation. - TMJ arthroscopy. - tonsillectomy. - umbilical hernia repair. Medical A Surgical History Notes nd Constitutional Symptoms (General Health) obesity Cardiovascular pulmonary  hypertension Endocrine hypothyroidism Genitourinary CKD stage 3 Musculoskeletal fibromyalgia Review of Systems (ROS) Constitutional Symptoms (General Health) Denies complaints or symptoms of Fatigue, Fever, Chills, Marked Weight Change. Eyes Complains or has symptoms of Glasses / Contacts - reading. Denies complaints or symptoms of Dry Eyes, Vision Changes. Ear/Nose/Mouth/Throat Denies complaints or symptoms of Chronic sinus problems or rhinitis. Respiratory Complains or has symptoms of Shortness of Breath - with exertion. Gastrointestinal Denies complaints or symptoms of Frequent diarrhea, Nausea, Vomiting. Endocrine Denies complaints or symptoms of Heat/cold intolerance. Genitourinary Denies complaints or symptoms of Frequent urination. Integumentary (Skin) Complains or has symptoms of Wounds - left lower leg. Musculoskeletal Denies complaints or symptoms of Muscle Pain, Muscle Weakness. Neurologic Complains or has symptoms of Numbness/parasthesias. Psychiatric Denies complaints or symptoms of Claustrophobia, Suicidal. Objective Constitutional No acute distress. Vitals Time Taken: 9:08 AM, Height: 63 in, Source: Stated, Weight: 233 lbs, Source: Stated, BMI: 41.3, Temperature: 98.3 F, Pulse: 92 bpm, Respiratory Rate: 18 breaths/min, Blood Pressure: 118/70 mmHg, Capillary Blood Glucose: 174 mg/dl. General Notes: glucose per pt report this am Respiratory Normal work of breathing on room air.. Cardiovascular  Distal pulses nonpalpable. 1+ pitting edema to the remaining lower extremity.. General Notes: 05/20/2022: On her left lower extremity, she has 2 ulcers on the anterior tibial surface, as well as another ulcer on the posterior calf. The posterior lesion is nearly epithelialized. The anterior lesions expose the fat layer. There is some slough accumulation in both sites. No odor or purulent drainage. She does have about 1+ pitting edema. Integumentary (Hair, Skin) Wound #1  status is Open. Original cause of wound was Gradually Appeared. The date acquired was: 04/08/2022. The wound is located on the Left,Proximal,Anterior Lower Leg. The wound measures 1.5cm length x 2.1cm width x 0.1cm depth; 2.474cm^2 area and 0.247cm^3 volume. There is Fat Layer (Subcutaneous Tissue) exposed. There is no tunneling or undermining noted. There is a medium amount of serosanguineous drainage noted. The wound margin is distinct with the outline attached to the wound base. There is large (67-100%) red granulation within the wound bed. There is a small (1-33%) amount of necrotic tissue within the wound bed including Adherent Slough. Wound #2 status is Open. Original cause of wound was Gradually Appeared. The date acquired was: 04/15/2022. The wound is located on the [facility] Lower Leg. The wound measures 1.3cm length x 0.6cm width x 0.1cm depth; 0.613cm^2 area and 0.061cm^3 volume. There is Fat Layer (Subcutaneous Tissue) exposed. There is no tunneling or undermining noted. There is a medium amount of serosanguineous drainage noted. The wound margin is distinct with the outline attached to the wound base. There is large (67-100%) red granulation within the wound bed. There is a small (1-33%) amount of necrotic tissue within the wound bed including Adherent Slough. Wound #3 status is Open. Original cause of wound was Gradually Appeared. The date acquired was: 04/15/2022. The wound is located on the Left,Posterior Lower Leg. The wound measures 3.2cm length x 1.8cm width x 0.1cm depth; 4.524cm^2 area and 0.452cm^3 volume. There is Fat Layer (Subcutaneous Tissue) exposed. There is no tunneling or undermining noted. There is a medium amount of serosanguineous drainage noted. There is large (67-100%) red, pink granulation within the wound bed. There is no necrotic tissue within the wound bed. 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 There was a Excisional Skin/Subcutaneous Tissue o Debridement with a total area of 3.15 sq cm performed by [name], MD. With the following instrument(s): Curette to remove Viable and Non-Viable tissue/material. Material removed includes Subcutaneous Tissue and Slough and after achieving pain control using Lidocaine 4% T opical Solution. No specimens were taken. A time out was conducted at 09:45, prior to the start of the procedure. A Minimum amount of bleeding was controlled with Pressure. The procedure was tolerated well with a pain level of 2 throughout and a pain level of 1 following the procedure. Post Debridement Measurements: 1.5cm length x 2.1cm width x 0.1cm depth; 0.247cm^3 volume. Character of Wound/Ulcer Post Debridement is improved. Post procedure Diagnosis Wound #1: Same as Pre-Procedure Plan Follow-up Appointments: Return Appointment in 1 week. - Dr. Celine Ahr RM 1 Friday 7/14 @ 2:45 pm Bathing/ Shower/ Hygiene: May shower with protection but do not get wound dressing(s) wet. Edema  Control - Lymphedema / SCD / Other: Elevate legs to the level of the heart or above for 30 minutes daily and/or when sitting, a frequency of: - throughout the day Avoid standing for long periods of time. Exercise regularly Services and Therapies ordered were: Arterial Studies with ABI's and TBIs- Unilateral left lower leg - left lower leg nonhealing ulcers and abnormal ABIs in clinic with claudication The following medication(s) was prescribed: lidocaine topical 4 % cream cream  topical for prior to debridement was prescribed at facility WOUND #1: - Lower Leg Wound Laterality: Left, Anterior, Proximal Peri-Wound Care: Sween Lotion (Moisturizing lotion) 1 x Per Week/30 Days Discharge Instructions: Apply moisturizing lotion as directed Prim Dressing: KerraCel Ag Gelling Fiber Dressing, 2x2 in (silver alginate) 1 x Per Week/30 Days ary Discharge Instructions: Apply silver alginate to wound bed as instructed Secondary Dressing: Woven Gauze Sponge, Non-Sterile 4x4 in 1 x Per Week/30 Days Discharge Instructions: Apply over primary dressing as directed. Com pression Wrap: Kerlix Roll 4.5x3.1 (in/yd) 1 x Per Week/30 Days Discharge Instructions: Apply Kerlix and Coban compression as directed. Com pression Wrap: Coban Self-Adherent Wrap 4x5 (in/yd) 1 x Per Week/30 Days Discharge Instructions: Apply over Kerlix as directed. WOUND #2: - Lower Leg Wound Laterality: Left, Anterior, Distal Peri-Wound Care: Sween Lotion (Moisturizing lotion) 1 x Per Week/30 Days Discharge Instructions: Apply moisturizing lotion as directed Prim Dressing: KerraCel Ag Gelling Fiber Dressing, 2x2 in (silver alginate) 1 x Per Week/30 Days ary Discharge Instructions: Apply silver alginate to wound bed as instructed Secondary Dressing: Woven Gauze Sponge, Non-Sterile 4x4 in 1 x Per Week/30 Days Discharge Instructions: Apply over primary dressing as directed. Com pression Wrap: Kerlix Roll 4.5x3.1 (in/yd) 1 x Per Week/30 Days Discharge Instructions: Apply Kerlix and Coban compression as directed. Com pression Wrap: Coban Self-Adherent Wrap 4x5 (in/yd) 1 x Per Week/30 Days Discharge Instructions: Apply over Kerlix as directed. WOUND #3: - Lower Leg Wound Laterality: Left, Posterior Peri-Wound Care: Sween Lotion (Moisturizing lotion) 1 x Per Week/30 Days Discharge Instructions: Apply moisturizing lotion as directed Prim Dressing: KerraCel Ag Gelling Fiber Dressing, 2x2 in (silver alginate) 1 x Per  Week/30 Days ary Discharge Instructions: Apply silver alginate to wound bed as instructed Secondary Dressing: Woven Gauze Sponge, Non-Sterile 4x4 in 1 x Per Week/30 Days Discharge Instructions: Apply over primary dressing as directed. Com pression Wrap: Kerlix Roll 4.5x3.1 (in/yd) 1 x Per Week/30 Days Discharge Instructions: Apply Kerlix and Coban compression as directed. Com pression Wrap: Coban Self-Adherent Wrap 4x5 (in/yd) 1 x Per Week/30 Days Discharge Instructions: Apply over Kerlix as directed. 05/20/2022: This is a 66 year old diabetic smoker who has venous insufficiency and has developed chronic ulcers. On her left lower extremity, she has 2 ulcers on the anterior tibial surface, as well as another ulcer on the posterior calf. The posterior lesion is nearly epithelialized. The anterior lesions expose the fat layer. There is some slough accumulation in both sites. No odor or purulent drainage. She does have about 1+ pitting edema. I used a curette to debride slough and nonviable subcutaneous tissue from the anterior tibial lesions. The posterior lesion is nearly epithelialized. We will apply silver alginate and Kerlix/Coban compression. We will refer her to [facility] for formal vascular studies given her low ABI in clinic today. She was advised that she needs to quit smoking as she is at risk of losing her other leg if she persists. Follow-up here in 1 week. Electronic Signature(s) Signed: 05/20/2022 10:15:38 AM By: [name] MD FACS Entered By: Celine Ahr,  Victorino Dike on 05/20/2022 10:15:38 -------------------------------------------------------------------------------- HxROS Details Patient Name: Date of Service: BLA CKA RD, CA RO L F. 05/20/2022 9:00 A M Medical Record Number: [id number] Patient Account Number: [id number] Date of Birth/Sex: Treating RN: 10/26/1956 (66 y.o. Tommye Standard Primary Care Provider: Other Clinician: Samuel Jester Referring Provider: Treating  Provider/Extender: Sharon Mt in Treatment: 0 Information Obtained From Patient Chart Constitutional Symptoms (General Health) Complaints and Symptoms: Negative for: Fatigue; Fever; Chills; Marked Weight Change Medical History: Past Medical History Notes: obesity Eyes Complaints and Symptoms: Positive for: Glasses / Contacts - reading Negative for: Dry Eyes; Vision Changes Medical History: Negative for: Cataracts; Glaucoma; Optic Neuritis Ear/Nose/Mouth/Throat Complaints and Symptoms: Negative for: Chronic sinus problems or rhinitis Respiratory Complaints and Symptoms: Positive for: Shortness of Breath - with exertion Gastrointestinal Complaints and Symptoms: Negative for: Frequent diarrhea; Nausea; Vomiting Endocrine Complaints and Symptoms: Negative for: Heat/cold intolerance Medical History: Positive for: Type II Diabetes Past Medical History Notes: hypothyroidism Time with diabetes: 10 yrs Treated with: Oral agents Blood sugar tested every day: Yes Tested : once Genitourinary Complaints and Symptoms: Negative for: Frequent urination Medical History: Negative for: End Stage Renal Disease Past Medical History Notes: CKD stage 3 Integumentary (Skin) Complaints and Symptoms: Positive for: Wounds - left lower leg Medical History: Negative for: History of Burn Musculoskeletal Complaints and Symptoms: Negative for: Muscle Pain; Muscle Weakness Medical History: Positive for: Gout - hx; Osteomyelitis - hx right foot Past Medical History Notes: fibromyalgia Neurologic Complaints and Symptoms: Positive for: Numbness/parasthesias Medical History: Positive for: Neuropathy Psychiatric Complaints and Symptoms: Negative for: Claustrophobia; Suicidal Medical History: Negative for: Anorexia/bulimia; Confinement Anxiety Hematologic/Lymphatic Cardiovascular Medical History: Positive for: Hypertension; Peripheral Arterial Disease;  Peripheral Venous Disease Past Medical History Notes: pulmonary hypertension Immunological Oncologic Medical History: Negative for: Received Chemotherapy; Received Radiation Immunizations Pneumococcal Vaccine: Received Pneumococcal Vaccination: Yes Received Pneumococcal Vaccination On or After 60th Birthday: Yes Implantable Devices None Hospitalization / Surgery History Type of Hospitalization/Surgery right BKA vaginal hysterectomy tubal ligation TMJ arthroscopy tonsillectomy umbilical hernia repair Family and Social History Cancer: Yes - Mother; Diabetes: No; Heart Disease: Yes - Father; Hereditary Spherocytosis: No; Hypertension: Yes - Father; Kidney Disease: No; Lung Disease: No; Seizures: No; Stroke: Yes - Maternal Grandparents; Thyroid Problems: No; Tuberculosis: No; Current every day smoker - 1 ppd; Marital Status - Married; Alcohol Use: Never; Drug Use: No History; Caffeine Use: Daily - tea; Financial Concerns: No; Food, Clothing or Shelter Needs: No; Support System Lacking: No; Transportation Concerns: No Psychologist, prison and probation services) Signed: 05/20/2022 12:58:53 PM By: [name] MD FACS Signed: 05/20/2022 5:06:25 PM By: Zenaida Deed RN, BSN Entered By: Zenaida Deed on 05/20/2022 09:20:53 -------------------------------------------------------------------------------- SuperBill Details Patient Name: Date of Service: BLA CKA RD, CA RO L F. 05/20/2022 Medical Record Number: [id number] Patient Account Number: [id number] Date of Birth/Sex: Treating RN: Jul 21, 1956 (66 y.o. 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 E11.622 Type 2 diabetes mellitus with other skin ulcer I87.2 Venous insufficiency (chronic) (peripheral) I10 Essential (primary) hypertension I27.20 Pulmonary hypertension, unspecified Z89.511 Acquired absence of right leg below knee Facility Procedures CPT4 Code: 93818299 Description: 99213 - WOUND CARE VISIT-LEV 3 EST PT Modifier: 25 Quantity: 1 CPT4 Code: [id number] Description: 11042 - DEB SUBQ TISSUE 20  SQ CM/< ICD-10 Diagnosis Description L97.829 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 Modifier: ity Quantity: 1 Physician Procedures : CPT4 Code Description Modifier [id number] - WC PHYS LEVEL 4 - NEW PT 25 ICD-10 Diagnosis Description L97.829 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ity E11.622 Type 2 diabetes mellitus with other skin ulcer  I87.2 Venous insufficiency (chronic) (peripheral) I83.029 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

## 2022-05-28 ENCOUNTER — Other Ambulatory Visit (HOSPITAL_COMMUNITY): Payer: Self-pay | Admitting: General Surgery

## 2022-05-28 ENCOUNTER — Encounter (HOSPITAL_BASED_OUTPATIENT_CLINIC_OR_DEPARTMENT_OTHER): Payer: Medicare Other | Admitting: Internal Medicine

## 2022-05-28 DIAGNOSIS — I83228 Varicose veins of left lower extremity with both ulcer of other part of lower extremity and inflammation: Secondary | ICD-10-CM

## 2022-05-28 DIAGNOSIS — Z09 Encounter for follow-up examination after completed treatment for conditions other than malignant neoplasm: Secondary | ICD-10-CM | POA: Diagnosis not present

## 2022-05-28 DIAGNOSIS — E11622 Type 2 diabetes mellitus with other skin ulcer: Secondary | ICD-10-CM

## 2022-06-01 NOTE — Progress Notes (Signed)
TURNER, KUNZMAN ([id number]) Visit Report for 05/28/2022 Debridement Details Patient Name: Date of Service: BLA CKA RD, CA RO L F. 05/28/2022 2:45 PM Medical Record Number: [id number] Patient Account Number: [id number] Date of Birth/Sex: Treating RN: Sep 23, 1956 (66 y.o. Erin [name] Primary Care Provider: Samuel Jester Other Clinician: Referring Provider: Treating Provider/Extender: Waylan Boga in Treatment: 1 Debridement Performed for Assessment: Wound #1 Left,Proximal,Anterior Lower Leg Performed By: Physician Maxwell Caul., MD Debridement Type: Debridement Level of Consciousness (Pre-procedure): Awake and Alert Pre-procedure Verification/Time Out Yes - 15:50 Taken: Start Time: 15:51 Pain Control: Lidocaine 4% T opical Solution T Area Debrided (L x W): otal 1.2 (cm) x 1.7 (cm) = 2.04 (cm) Tissue and other material debrided: Viable, Non-Viable, Slough, Subcutaneous, Skin: Dermis , Skin: Epidermis, Slough Level: Skin/Subcutaneous Tissue Debridement Description: Excisional Instrument: Curette Bleeding: Minimum Hemostasis Achieved: Pressure End Time: 15:57 Procedural Pain: 0 Post Procedural Pain: 0 Response to Treatment: Procedure was tolerated well Level of Consciousness (Post- Awake and Alert procedure): Post Debridement Measurements of Total Wound Length: (cm) 1.2 Width: (cm) 1.7 Depth: (cm) 0.1 Volume: (cm) 0.16 Character of Wound/Ulcer Post Debridement: Improved Post Procedure Diagnosis Same as Pre-procedure Electronic Signature(s) Signed: 05/28/2022 4:59:49 PM By: Erin Najjar MD Signed: 06/01/2022 5:31:58 PM By: Zenaida Deed RN, BSN Entered By: Erin [name] on 05/28/2022 15:59:36 -------------------------------------------------------------------------------- HPI Details Patient Name: Date of Service: BLA CKA RD, CA RO L F. 05/28/2022 2:45 PM Medical Record Number: [id number] Patient Account Number: [id number] Date  of Birth/Sex: Treating RN: 25-Nov-1955 (66 y.o. Erin [name] Primary Care Provider: Samuel Jester Other Clinician: Referring Provider: Treating Provider/Extender: Waylan Boga in Treatment: 1 History of Present Illness HPI Description: ADMISSION This is a 66 year old obese diabetic patient who has a history of right below-knee amputation. She has venous insufficiency on the left lower leg. It has been present for about 8 weeks. She presented to her primary care provider's office on May 13, 2022 with an increase in redness, oozing, and bleeding. At the time, she was not covering the wound. She was prescribed a 10-day course of Bactrim and referred to the wound care center for further evaluation and management. I do not have a recent hemoglobin A1c available for review; the last 1 available in her electronic medical record was 5.8, but this was over 5 years ago. The notes provided from her family doctor do not include this information. The patient reports that her last A1c was about 6 months ago and was 5.4. She does smoke about a pack of cigarettes daily. ABI in clinic was diminished at 0.85. On her left lower extremity, she has 2 ulcers on the anterior tibial surface, as well as another ulcer on the posterior calf. The posterior lesion is nearly epithelialized. The anterior lesions expose the fat layer. There is some slough accumulation in both sites. No odor or purulent drainage. She does have about 1+ pitting edema. She does not wear compression stockings. 7/14; 2 or 3 of the 3 wounds she had on her right leg including the posterior and distal anterior wounds are closed. She had 1 remaining wound area. She has been using silver alginate under kerlix Coban Electronic Signature(s) Signed: 05/28/2022 4:59:49 PM By: Erin Najjar MD Entered By: Erin [name] on 05/28/2022  16:00:13 -------------------------------------------------------------------------------- Physical Exam Details Patient Name: Date of Service: BLA CKA RD, CA RO L F. 05/28/2022 2:45 PM Medical Record Number: [id number] Patient Account Number: [id number] Date of Birth/Sex: Treating RN: 1956/02/21 (66 y.o.  Erin [name] Primary Care Provider: Samuel Jester Other Clinician: Referring Provider: Treating Provider/Extender: Waylan Boga in Treatment: 1 Constitutional Patient is hypotensive.However she appears well. Pulse regular and within target range for patient.Marland Kitchen Respirations regular, non-labored and within target range.. Temperature is normal and within the target range for the patient.Marland Kitchen Appears in no distress. Notes Wound exam; on her left anterior lower leg 2/3 ulcers are closed the area on the distal left left anterior and the posterior calf are both epithelialized. She has 1 remaining proximal anterior wound. Necrotic surface debrided with a #5 curette hemostasis with direct pressure. This cleans up quite nicely with healthy granulation tissue. No evidence of surrounding infection. Edema control is quite good Psychologist, prison and probation services) Signed: 05/28/2022 4:59:49 PM By: Erin Najjar MD Entered By: Erin [name] on 05/28/2022 16:01:20 -------------------------------------------------------------------------------- Physician Orders Details Patient Name: Date of Service: BLA CKA RD, CA RO L F. 05/28/2022 2:45 PM Medical Record Number: [id number] Patient Account Number: [id number] Date of Birth/Sex: Treating RN: 09-20-1956 (65 y.o. 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 - Dr. Lady Gary RM 1 Return A Thursday 06/03/2022 745am Other: - Friday 06/04/2022 arterial studies patient to purchase compression stockings and bring in at next appt time. Bathing/ Shower/ Hygiene May shower with protection but do not get wound dressing(s) wet. Edema Control - Lymphedema / SCD / Other Elevate legs to the level of the heart or above for 30 minutes daily and/or when sitting, a frequency of: - throughout the day Avoid standing for long periods of time. Exercise regularly Compression stocking or Garment 20-30 mm/Hg pressure to: - ***leg measurement provided. Purchase stockings bring in next week. Wound Treatment Wound #1 - Lower Leg Wound Laterality: Left, Anterior, Proximal Peri-Wound Care: Sween Lotion (Moisturizing lotion) 1 x Per Week/30 Days Discharge Instructions: Apply moisturizing lotion as directed Prim Dressing: KerraCel Ag Gelling Fiber Dressing, 2x2 in (silver alginate) 1 x Per Week/30 Days ary Discharge Instructions: Apply silver alginate to wound bed as instructed Secondary Dressing: Woven Gauze Sponge, Non-Sterile 4x4 in 1 x Per Week/30 Days Discharge Instructions: Apply over primary dressing as directed. Compression Wrap: Kerlix Roll 4.5x3.1 (in/yd) 1 x Per Week/30 Days Discharge Instructions: Apply Kerlix and Coban compression as directed. Compression Wrap: Coban Self-Adherent Wrap 4x5 (in/yd) 1 x Per Week/30 Days Discharge Instructions: Apply over Kerlix as directed. Electronic Signature(s) Signed: 05/28/2022 4:59:49 PM By: Erin Najjar MD Signed: 05/28/2022 5:20:38 PM By: Shawn Stall RN, BSN Entered By: Shawn Stall on 05/28/2022 16:02:30 -------------------------------------------------------------------------------- Problem List Details Patient Name: Date of Service: BLA CKA RD, CA RO L F. 05/28/2022 2:45 PM Medical Record Number: [id number] Patient Account Number: [id number] Date of Birth/Sex: Treating RN: October 05, 1956 (66 y.o. 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 unspecified site 05/20/2022 No Yes E11.622 Type 2 diabetes mellitus with other skin ulcer 05/20/2022 No Yes I87.2 Venous insufficiency (chronic) (peripheral) 05/20/2022 No Yes I10 Essential (primary) hypertension 05/20/2022 No Yes I27.20 Pulmonary hypertension, unspecified 05/20/2022 No Yes Z89.511 Acquired absence of right leg below knee 05/20/2022 No Yes Inactive Problems Resolved Problems Electronic Signature(s) Signed: 05/28/2022 4:59:49 PM By: Erin Najjarobson, Yaeko Fazekas MD Entered By: Erin Najjarobson, Kwan Shellhammer on 05/28/2022 15:59:06 -------------------------------------------------------------------------------- Progress Note Details Patient Name: Date of Service: BLA CKA RD, CA RO L F. 05/28/2022 2:45 PM Medical Record Number: [id number] Patient Account Number: [id number] Date of Birth/Sex: Treating RN: 1956/01/05 (66 y.o. Erin StandardF) Boehlein, Linda Primary Care Provider: Samuel JesterButler, Cynthia Other Clinician: Referring Provider: Treating Provider/Extender: Waylan Bogaobson, Dachelle Molzahn Butler, Cynthia Weeks in Treatment: 1 Subjective History of Present Illness (HPI) ADMISSION This is a 66 year old obese diabetic patient who has a history of right below-knee  amputation. She has venous insufficiency on the left lower leg. It has been present for about 8 weeks. She presented to her primary care provider's office on May 13, 2022 with an increase in redness, oozing, and bleeding. At the time, she was not covering the wound. She was prescribed a 10-day course of Bactrim and referred to the wound care center for further evaluation and management. I do not have a recent hemoglobin A1c available for review; the last 1 available in her electronic medical record was 5.8, but this was over 5 years ago. The notes provided from her family doctor do not include this information. The patient reports that her last A1c was about 6 months ago and was 5.4. She does smoke about a pack of cigarettes daily. ABI in clinic was diminished at 0.85. On her left lower extremity, she has 2 ulcers on the anterior tibial surface, as well as another ulcer on the posterior calf. The posterior lesion is nearly epithelialized. The anterior lesions expose the fat layer. There is some slough accumulation in both sites. No odor or purulent drainage. She does have about 1+ pitting edema. She does not wear compression stockings. 7/14; 2 or 3 of the 3 wounds she had on her right leg including the posterior and distal anterior wounds are closed. She had 1 remaining wound area. She has been using silver alginate under kerlix Coban Objective Constitutional Patient is hypotensive.However she appears well. Pulse regular and within target range for patient.Marland Kitchen. Respirations regular, non-labored and within target range.. Temperature is normal and within the target range for the patient.Marland Kitchen. Appears in no distress. Vitals Time Taken: 3:02 PM, Height: 63 in, Weight: 233 lbs, BMI: 41.3, Temperature: 98.4 F, Pulse: 105 bpm, Respiratory Rate: 22 breaths/min, Blood Pressure: 92/50 mmHg, Capillary Blood Glucose: 176 mg/dl. General Notes: machine BP 83/52, manual BP 92/50. Patient denies dizziness, light  headedness, or ringing in the ears. Provider made aware. Advised patient to keep close monitoring on her BP. Patient in agreement. General Notes: Wound exam; on her left anterior lower leg 2/3 ulcers are closed the area on the distal left left anterior and the posterior calf are both epithelialized. She has 1 remaining proximal anterior wound. Necrotic surface debrided with a #5 curette hemostasis with direct pressure. This cleans up quite nicely with healthy granulation tissue. No evidence of surrounding infection. Edema control is quite good Integumentary (Hair, Skin) Wound #1 status is Open. Original cause of wound was Gradually Appeared. The date acquired was: 04/08/2022. The wound has been in treatment 1 weeks. The wound  is located on the Left,Proximal,Anterior Lower Leg. The wound measures 1.2cm length x 1.7cm width x 0.1cm depth; 1.602cm^2 area and 0.16cm^3 volume. There is Fat Layer (Subcutaneous Tissue) exposed. There is no tunneling or undermining noted. There is a none present amount of drainage noted. The wound margin is distinct with the outline attached to the wound base. There is small (1-33%) red granulation within the wound bed. There is a large (67-100%) amount of necrotic tissue within the wound bed including Adherent Slough. Wound #2 status is Healed - Epithelialized. Original cause of wound was Gradually Appeared. The date acquired was: 04/15/2022. The wound has been in treatment 1 weeks. The wound is located on the [facility] Lower Leg. The wound measures 0cm length x 0cm width x 0cm depth; 0cm^2 area and 0cm^3 volume. There is no tunneling or undermining noted. There is a medium amount of serosanguineous drainage noted. The wound margin is distinct with the outline attached to the wound base. There is no granulation within the wound bed. There is no necrotic tissue within the wound bed. Wound #3 status is Open. Original cause of wound was Gradually Appeared. The date  acquired was: 04/15/2022. The wound has been in treatment 1 weeks. The wound is located on the Left,Posterior Lower Leg. The wound measures 0cm length x 0cm width x 0cm depth; 0cm^2 area and 0cm^3 volume. There is no tunneling or undermining noted. There is a none present amount of drainage noted. The wound margin is distinct with the outline attached to the wound base. There is no granulation within the wound bed. There is no necrotic tissue within the wound bed. 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 There was a Excisional Skin/Subcutaneous Tissue o Debridement with a total area of 2.04 sq cm performed by Maxwell Caul., MD. With the following instrument(s): Curette to remove Viable and Non-Viable tissue/material. Material removed includes Subcutaneous Tissue, Slough, Skin: Dermis, and Skin: Epidermis after achieving pain control using Lidocaine 4% T opical Solution. A time out was conducted at 15:50, prior to the start of the procedure. A Minimum amount of bleeding was controlled with Pressure. The procedure was tolerated well with a pain level of 0 throughout and a pain level of 0 following the procedure. Post Debridement Measurements: 1.2cm length x 1.7cm width x 0.1cm depth; 0.16cm^3 volume. Character of Wound/Ulcer Post Debridement is improved. Post procedure Diagnosis Wound #1: Same as Pre-Procedure Plan 1. We continued with silver alginate under kerlix Coban. 2. We are going to order her 20/30 mm below-knee  stocking for the left leg from elastic therapy in [place] in anticipation that this will probably be closed in a week or 2. Electronic Signature(s) Signed: 05/28/2022 4:59:49 PM By: Erin Najjar MD Entered By: Erin [name] on 05/28/2022 16:02:03 -------------------------------------------------------------------------------- SuperBill Details Patient Name: Date of Service: BLA CKA RD, CA RO L F. 05/28/2022 Medical Record Number: [id number] Patient Account Number: [id number] Date of Birth/Sex: Treating RN: [birth year] (66 y.o. 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 of unspecified site E11.622 Type 2 diabetes mellitus with other skin ulcer I87.2 Venous insufficiency (chronic) (peripheral) I10 Essential (primary) hypertension I27.20 Pulmonary hypertension, unspecified Z89.511 Acquired absence of right leg below knee Facility Procedures CPT4 Code: [id number] Description: 11042 - DEB SUBQ TISSUE 20 SQ CM/< ICD-10 Diagnosis Description L97.829 Non-pressure chronic ulcer of other part of left lower leg with unspecified sev I83.029 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

## 2022-06-01 NOTE — Progress Notes (Signed)
Erin [name], Erin [name] ([id number]) Visit Report for 05/28/2022 Arrival Information Details Patient Name: Date of Service: BLA CKA RD, [place] RO L F. 05/28/2022 2:45 PM Medical Record Number: [id number] Patient Account Number: [id number] Date of Birth/Sex: Treating RN: [birth year], Erin [name] (66 y.o. Erin [name], Erin [name] Primary Care Erin [name]: Erin [name] Other Clinician: Referring Fabienne Nolasco: Treating [name]/Extender: Waylan Boga in Treatment: 1 Visit Information History Since Last Visit Added or deleted any medications: No Patient Arrived: Erin [name] Any new allergies or adverse reactions: No Arrival Time: 15:05 Had a fall or experienced change in No Accompanied By: family activities of daily living that Erin affect Transfer Assistance: None risk of falls: Patient Identification Verified: Yes Signs or symptoms of abuse/neglect since last visito No Secondary Verification Process Completed: Yes Hospitalized since last visit: No Patient Requires Transmission-Based Precautions: No Implantable device outside of the clinic excluding No Patient Has Alerts: No cellular tissue based products placed in the center since last visit: Has Dressing in Place as Prescribed: Yes Has Compression in Place as Prescribed: Yes Pain Present Now: Yes Electronic Signature(s) Signed: 05/28/2022 5:20:38 PM By: Erin Stall RN, BSN Entered By: Erin [name] on 05/28/2022 15:06:12 -------------------------------------------------------------------------------- Encounter Discharge Information Details Patient Name: Date of Service: BLA CKA RD, [place] RO L F. 05/28/2022 2:45 PM Medical Record Number: [id number] Patient Account Number: [id number] Date of Birth/Sex: Treating RN: Erin [name] (66 y.o. Erin [name] Primary Care [name]: Erin [name] Other Clinician: Referring [name]: Treating [name]/Extender: Waylan Boga in Treatment: 1 Encounter Discharge Information Items Post  Procedure Vitals Discharge Condition: Stable Temperature (F): 98.4 Ambulatory Status: Cane Pulse (bpm): 105 Discharge Destination: Home Respiratory Rate (breaths/min): 20 Transportation: Private Auto Blood Pressure (mmHg): 92/50 Accompanied By: self Schedule Follow-up Appointment: Yes Clinical Summary of Care: Electronic Signature(s) Signed: 05/28/2022 5:20:38 PM By: Erin Stall RN, BSN Entered By: Erin [name] on 05/28/2022 [name]:03:25 -------------------------------------------------------------------------------- Lower Extremity Assessment Details Patient Name: Date of Service: BLA CKA RD, [place] RO L F. 05/28/2022 2:45 PM Medical Record Number: [id number] Patient Account Number: [id number] Date of Birth/Sex: Treating RN: Erin [name] ([name] y.o. Erin [name], Erin [name] Primary Care Britton Bera: Erin [name] Other Clinician: Referring [name]: Treating [name]/Extender: Waylan Boga in Treatment: 1 Edema Assessment Assessed: [name]: Yes] [name]: No] Edema: [Left: N] [Right: o] Calf Left: Right: Point of Measurement: 31 cm From Medial Instep 35 cm Ankle Left: Right: Point of Measurement: 12 cm From Medial Instep 21 cm Knee To Floor Left: Right: From Medial Instep 39 cm Vascular Assessment Pulses: Dorsalis Pedis Palpable: [Left:Yes] Electronic Signature(s) Signed: 05/28/2022 5:20:38 PM By: Erin Stall RN, BSN Entered By: Erin [name] on 05/28/2022 15:58:35 -------------------------------------------------------------------------------- Multi Wound Chart Details Patient Name: Date of Service: BLA CKA RD, [place] RO L F. 05/28/2022 2:45 PM Medical Record Number: [id number] Patient Account Number: [id number] Date of Birth/Sex: Treating RN: Erin [name][birth year] (66 y.o. Erin [name], Erin [name] Primary Care [name]: Erin [name] Other Clinician: Referring [name]: Treating [name]/Extender: Waylan Boga in Treatment: 1 Vital  Signs Height(in): 63 Capillary Blood Glucose(mg/dl): 712 Weight(lbs): 458 Pulse(bpm): 105 Body Mass Index(BMI): 41.3 Blood Pressure(mmHg): 92/50 Temperature(F): 98.4 Respiratory Rate(breaths/min): 22 Photos: [1:Left, Proximal, Anterior Lower Leg] [2:Left, Distal, Anterior Lower Leg] [3:Left, Posterior Lower Leg] Wound Location: [1:Gradually Appeared] [2:Gradually Appeared] [3:Gradually Appeared] Wounding Event: [1:T be determined o] [2:Venous Leg Ulcer] [3:Venous Leg Ulcer] Primary Etiology: [1:Hypertension, Peripheral Arterial] [2:Hypertension, Peripheral Arterial] [3:Hypertension, Peripheral Arterial] Comorbid History: [1:Disease, Peripheral Venous Disease, Disease, Peripheral Venous Disease, Disease, Peripheral Venous Disease, Type II Diabetes, Gout, Osteomyelitis,  Type II Diabetes, Gout, Osteomyelitis, Type II Diabetes, Gout, Osteomyelitis,  Neuropathy 04/08/2022] [2:Neuropathy 04/15/2022] [3:Neuropathy 04/15/2022] Date Acquired: [1:1] [2:1] [3:1] Weeks of Treatment: [1:Open] [2:Healed - Epithelialized] [3:Open] Wound Status: [1:No] [2:No] [3:No] Wound Recurrence: [1:1.2x1.7x0.1] [2:0x0x0] [3:0x0x0] Measurements L x W x D (cm) [1:1.602] [2:0] [3:0] A (cm) : rea [1:0.[name]] [2:0] [3:0] Volume (cm) : [1:35.20%] [2:100.00%] [3:100.00%] % Reduction in A [1:rea: 35.20%] [2:100.00%] [3:100.00%] % Reduction in Volume: [1:Full Thickness Without Exposed] [2:Full Thickness Without Exposed] [3:Full Thickness Without Exposed] Classification: [1:Support Structures None Present] [2:Support Structures Medium] [3:Support Structures None Present] Exudate A mount: [1:N/A] [2:Serosanguineous] [3:N/A] Exudate Type: [1:N/A] [2:red, brown] [3:N/A] Exudate Color: [1:Distinct, outline attached] [2:Distinct, outline attached] [3:Distinct, outline attached] Wound Margin: [1:Small (1-33%)] [2:None Present (0%)] [3:None Present (0%)] Granulation A mount: [1:Red] [2:N/A] [3:N/A] Granulation Quality: [1:Large  (67-100%)] [2:None Present (0%)] [3:None Present (0%)] Necrotic A mount: [1:Fat Layer (Subcutaneous Tissue): Yes Fascia: No] [3:Fascia: No] Exposed Structures: [1:Fascia: No Tendon: No Muscle: No Joint: No Bone: No Small (1-33%)] [2:Fat Layer (Subcutaneous Tissue): No Fat Layer (Subcutaneous Tissue): No Tendon: No Muscle: No Joint: No Bone: No Large (67-100%)] [3:Tendon: No Muscle: No Joint: No Bone: No Large  (67-100%)] Epithelialization: [1:Debridement - Excisional] [2:N/A] [3:N/A] Debridement: Pre-procedure Verification/Time Out 15:50 [2:N/A] [3:N/A] Taken: [1:Lidocaine 4% Topical Solution] [2:N/A] [3:N/A] Pain Control: [1:Subcutaneous, Slough] [2:N/A] [3:N/A] Tissue Debrided: [1:Skin/Subcutaneous Tissue] [2:N/A] [3:N/A] Level: [1:2.04] [2:N/A] [3:N/A] Debridement A (sq cm): [1:rea Curette] [2:N/A] [3:N/A] Instrument: [1:Minimum] [2:N/A] [3:N/A] Bleeding: [1:Pressure] [2:N/A] [3:N/A] Hemostasis A chieved: [1:0] [2:N/A] [3:N/A] Procedural Pain: [1:0] [2:N/A] [3:N/A] Post Procedural Pain: [1:Procedure was tolerated well] [2:N/A] [3:N/A] Debridement Treatment Response: [1:1.2x1.7x0.1] [2:N/A] [3:N/A] Post Debridement Measurements L x W x D (cm) [1:0.[name]] [2:N/A] [3:N/A] Post Debridement Volume: (cm) [1:Debridement] [2:N/A] [3:N/A] Treatment Notes Electronic Signature(s) Signed: 05/28/2022 4:59:49 PM By: Baltazar Najjarobson, Michael MD Signed: 06/01/2022 5:31:58 PM By: Zenaida DeedBoehlein, Linda RN, BSN Entered By: Baltazar Najjarobson, Michael on 05/28/2022 15:59:15 -------------------------------------------------------------------------------- Multi-Disciplinary Care Plan Details Patient Name: Date of Service: BLA CKA RD, [place] RO L F. 05/28/2022 2:45 PM Medical Record Number: [id number] Patient Account Number: [id number] Date of Birth/Sex: Treating RN: Erin [name], Erin [name] (66 y.o. Erin PickettF) Deaton, Erin KendallBobbi Primary Care Ronie Barnhart: Erin JesterButler, Cynthia Other Clinician: Referring Kortlyn Koltz: Treating Hilliard Borges/Extender: Waylan Bogaobson, Michael Butler,  Cynthia Weeks in Treatment: 1 Multidisciplinary Care Plan reviewed with physician Active Inactive Abuse / Safety / Falls / Self Care Management Nursing Diagnoses: History of Falls Goals: Patient/caregiver will verbalize/demonstrate measures taken to prevent injury and/or falls Date Initiated: 05/20/2022 Target Resolution Date: 06/17/2022 Goal Status: Active Interventions: Assess fall risk on admission and as needed Assess impairment of mobility on admission and as needed per policy Notes: Nutrition Nursing Diagnoses: Impaired glucose control: actual or potential Potential for alteratiion in Nutrition/Potential for imbalanced nutrition Goals: Patient/caregiver will maintain therapeutic glucose control Date Initiated: 05/20/2022 Target Resolution Date: 06/17/2022 Goal Status: Active Interventions: Assess HgA1c results as ordered upon admission and as needed Provide education on elevated blood sugars and impact on wound healing Treatment Activities: Giving encouragement to exercise : 05/20/2022 Patient referred to Primary Care Physician for further nutritional evaluation : 05/20/2022 Notes: Venous Leg Ulcer Nursing Diagnoses: Knowledge deficit related to disease process and management Potential for venous Insuffiency (use before diagnosis confirmed) Goals: Patient will maintain optimal edema control Date Initiated: 05/20/2022 Target Resolution Date: 06/17/2022 Goal Status: Active Verify adequate tissue perfusion prior to therapeutic compression application Date Initiated: 05/20/2022 Target Resolution Date: 06/17/2022 Goal Status: Active Interventions: Assess peripheral edema status every visit. Compression as ordered Treatment Activities:  Therapeutic compression applied : 05/20/2022 Notes: Wound/Skin Impairment Nursing Diagnoses: Impaired tissue integrity Knowledge deficit related to smoking impact on wound healing Knowledge deficit related to ulceration/compromised skin  integrity Goals: Patient will demonstrate a reduced rate of smoking or cessation of smoking Date Initiated: 05/20/2022 Target Resolution Date: 06/17/2022 Goal Status: Active Patient/caregiver will verbalize understanding of skin care regimen Date Initiated: 05/20/2022 Target Resolution Date: 06/17/2022 Goal Status: Active Ulcer/skin breakdown will have a volume reduction of [name]% by week 4 Date Initiated: 05/20/2022 Target Resolution Date: 06/17/2022 Goal Status: Active Interventions: Assess patient/caregiver ability to obtain necessary supplies Assess patient/caregiver ability to perform ulcer/skin care regimen upon admission and as needed Assess ulceration(s) every visit Provide education on ulcer and skin care Treatment Activities: Skin care regimen initiated : 05/20/2022 Topical wound management initiated : 05/20/2022 Notes: Electronic Signature(s) Signed: 05/28/2022 5:20:38 PM By: Erin Stall RN, BSN Entered By: Erin [name] on 05/28/2022 15:[name]:37 -------------------------------------------------------------------------------- Pain Assessment Details Patient Name: Date of Service: BLA CKA RD, [place] RO L F. 05/28/2022 2:45 PM Medical Record Number: [id number] Patient Account Number: [id number] Date of Birth/Sex: Treating RN: Erin [name], Erin [name] (66 y.o. Erin [name] Primary Care Margia Wiesen: Erin [name] Other Clinician: Referring Cleveland Paiz: Treating Antania Hoefling/Extender: Waylan Boga in Treatment: 1 Active Problems Location of Pain Severity and Description of Pain Patient Has Paino Yes Site Locations Pain Location: Generalized Pain, Pain in Ulcers Rate the pain. Current Pain Level: 8 Pain Management and Medication Current Pain Management: Medication: No Cold Application: No Rest: No Massage: No Activity: No T.E.N.S.: No Heat Application: No Leg drop or elevation: No Is the Current Pain Management Adequate: Adequate How does your wound impact your activities  of daily livingo Sleep: No Bathing: No Appetite: No Relationship With Others: No Bladder Continence: No Emotions: No Bowel Continence: No Work: No Toileting: No Drive: No Dressing: No Hobbies: No Psychologist, prison and probation services) Signed: 05/28/2022 5:20:38 PM By: Erin Stall RN, BSN Entered By: Erin [name] on 05/28/2022 15:06:24 -------------------------------------------------------------------------------- Patient/Caregiver Education Details Patient Name: Date of Service: BLA CKA RD, [place] RO L F. 7/14/2023andnbsp2:45 PM Medical Record Number: [id number] Patient Account Number: [id number] Date of Birth/Gender: Treating RN: [birth year]/Erin [name] (66 y.o. Erin [name] Primary Care Physician: Erin [name] Other Clinician: Referring Physician: Treating Physician/Extender: Waylan Boga in Treatment: 1 Education Assessment Education Provided To: Patient Education Topics Provided Wound/Skin Impairment: Handouts: Skin Care Do's and Dont's Methods: Explain/Verbal Responses: Reinforcements needed Electronic Signature(s) Signed: 05/28/2022 5:20:38 PM By: Erin Stall RN, BSN Entered By: Erin [name] on 05/28/2022 15:[name]:47 -------------------------------------------------------------------------------- Wound Assessment Details Patient Name: Date of Service: BLA CKA RD, [place] RO L F. 05/28/2022 2:45 PM Medical Record Number: [id number] Patient Account Number: [id number] Date of Birth/Sex: Treating RN: Erin [name] (66 y.o. Erin [name], Erin [name] Primary Care Martel Galvan: Erin [name] Other Clinician: Referring Chrishelle Zito: Treating Gordan Grell/Extender: Waylan Boga in Treatment: 1 Wound Status Wound Number: 1 Primary T be determined o Etiology: Wound Location: Left, Proximal, Anterior Lower Leg Wound Open Wounding Event: Gradually Appeared Status: Date Acquired: 04/08/2022 Comorbid Hypertension, Peripheral Arterial Disease, Peripheral  Venous Weeks Of Treatment: 1 History: Disease, Type II Diabetes, Gout, Osteomyelitis, Neuropathy Clustered Wound: No Photos Wound Measurements Length: (cm) 1.2 Width: (cm) 1.7 Depth: (cm) 0.1 Area: (cm) 1.602 Volume: (cm) 0.[name] % Reduction in Area: 35.2% % Reduction in Volume: 35.2% Epithelialization: Small (1-33%) Tunneling: No Undermining: No Wound Description Classification: Full Thickness Without Exposed Support Structures Wound Margin: Distinct, outline attached Exudate Amount: None Present Foul Odor After Cleansing: No Slough/Fibrino  Yes Wound Bed Granulation Amount: Small (1-33%) Exposed Structure Granulation Quality: Red Fascia Exposed: No Necrotic Amount: Large (67-100%) Fat Layer (Subcutaneous Tissue) Exposed: Yes Necrotic Quality: Adherent Slough Tendon Exposed: No Muscle Exposed: No Joint Exposed: No Bone Exposed: No Treatment Notes Wound #1 (Lower Leg) Wound Laterality: Left, Anterior, Proximal Cleanser Peri-Wound Care Sween Lotion (Moisturizing lotion) Discharge Instruction: Apply moisturizing lotion as directed Topical Primary Dressing KerraCel Ag Gelling Fiber Dressing, 2x2 in (silver alginate) Discharge Instruction: Apply silver alginate to wound bed as instructed Secondary Dressing Woven Gauze Sponge, Non-Sterile 4x4 in Discharge Instruction: Apply over primary dressing as directed. Secured With Compression Wrap Kerlix Roll 4.5x3.1 (in/yd) Discharge Instruction: Apply Kerlix and Coban compression as directed. Coban Self-Adherent Wrap 4x5 (in/yd) Discharge Instruction: Apply over Kerlix as directed. Compression Stockings Add-Ons Electronic Signature(s) Signed: 05/28/2022 5:20:38 PM By: Erin Stall RN, BSN Entered By: Erin [name] on 05/28/2022 15:20:40 -------------------------------------------------------------------------------- Wound Assessment Details Patient Name: Date of Service: BLA CKA RD, [place] RO L F. 05/28/2022 2:45 PM Medical  Record Number: [id number] Patient Account Number: [id number] Date of Birth/Sex: Treating RN: [birth year] (66 y.o. Erin [name], Erin [name] Primary Care Deaun Rocha: Erin [name] Other Clinician: Referring Jahid Weida: Treating Mccormick Macon/Extender: Waylan Boga in Treatment: 1 Wound Status Wound Number: 2 Primary Venous Leg Ulcer Etiology: Wound Location: Left, Distal, Anterior Lower Leg Wound Healed - Epithelialized Wounding Event: Gradually Appeared Status: Date Acquired: 04/15/2022 Comorbid Hypertension, Peripheral Arterial Disease, Peripheral Venous Weeks Of Treatment: 1 History: Disease, Type II Diabetes, Gout, Osteomyelitis, Neuropathy Clustered Wound: No Photos Wound Measurements Length: (cm) Width: (cm) Depth: (cm) Area: (cm) Volume: (cm) 0 % Reduction in Area: 100% 0 % Reduction in Volume: 100% 0 Epithelialization: Large (67-100%) 0 Tunneling: No 0 Undermining: No Wound Description Classification: Full Thickness Without Exposed Support Structures Wound Margin: Distinct, outline attached Exudate Amount: Medium Exudate Type: Serosanguineous Exudate Color: red, brown Foul Odor After Cleansing: No Slough/Fibrino No Wound Bed Granulation Amount: None Present (0%) Exposed Structure Necrotic Amount: None Present (0%) Fascia Exposed: No Fat Layer (Subcutaneous Tissue) Exposed: No Tendon Exposed: No Muscle Exposed: No Joint Exposed: No Bone Exposed: No Electronic Signature(s) Signed: 05/28/2022 5:20:38 PM By: Erin Stall RN, BSN Entered By: Erin [name] on 05/28/2022 15:56:24 -------------------------------------------------------------------------------- Wound Assessment Details Patient Name: Date of Service: BLA CKA RD, [place] RO L F. 05/28/2022 2:45 PM Medical Record Number: [id number] Patient Account Number: [id number] Date of Birth/Sex: Treating RN: Erin [name], Erin [name] ([name] y.o. Erin [name], Erin [name] Primary Care Khia Dieterich: Other Clinician: Samuel [name] Referring Dyshon Philbin: Treating Ferlando Lia/Extender: Waylan Boga in Treatment: 1 Wound Status Wound Number: 3 Primary Venous Leg Ulcer Etiology: Wound Location: Left, Posterior Lower Leg Wound Open Wounding Event: Gradually Appeared Status: Date Acquired: 04/15/2022 Comorbid Hypertension, Peripheral Arterial Disease, Peripheral Venous Weeks Of Treatment: 1 History: Disease, Type II Diabetes, Gout, Osteomyelitis, Neuropathy Clustered Wound: No Photos Wound Measurements Length: (cm) Width: (cm) Depth: (cm) Area: (cm) Volume: (cm) 0 % Reduction in Area: 100% 0 % Reduction in Volume: 100% 0 Epithelialization: Large (67-100%) 0 Tunneling: No 0 Undermining: No Wound Description Classification: Full Thickness Without Exposed Support Structures Wound Margin: Distinct, outline attached Exudate Amount: None Present Foul Odor After Cleansing: No Slough/Fibrino No Wound Bed Granulation Amount: None Present (0%) Exposed Structure Necrotic Amount: None Present (0%) Fascia Exposed: No Fat Layer (Subcutaneous Tissue) Exposed: No Tendon Exposed: No Muscle Exposed: No Joint Exposed: No Bone Exposed: No Electronic Signature(s) Signed: 05/28/2022 5:20:38 PM By: Erin Stall RN, BSN Entered By: Erin [name] on 05/28/2022  15:56:39 -------------------------------------------------------------------------------- Vitals Details Patient Name: Date of Service: BLA CKA RD, [place] RO L F. 05/28/2022 2:45 PM Medical Record Number: [id number] Patient Account Number: [id number] Date of Birth/Sex: Treating RN: Erin [name][birth year] (66 y.o. Erin [name], Erin [name] Primary Care Jessamine Barcia: Erin [name] Other Clinician: Referring Laurance Heide: Treating Darey Hershberger/Extender: Waylan Boga in Treatment: 1 Vital Signs Time Taken: 15:[name] Temperature (F): 98.4 Height (in): 63 Pulse (bpm): 105 Weight (lbs): 233 Respiratory Rate (breaths/min): 22 Body Mass Index  (BMI): 41.3 Blood Pressure (mmHg): 92/50 Capillary Blood Glucose (mg/dl): 628 Reference Range: 80 - 120 mg / dl Notes machine BP 36/62, manual BP 92/50. Patient denies dizziness, light headedness, or ringing in the ears. Kinleigh Nault made aware. Advised patient to keep close monitoring on her BP. Patient in agreement. Electronic Signature(s) Signed: 05/28/2022 5:20:38 PM By: Erin Stall RN, BSN Entered By: Erin [name] on 05/28/2022 15:14:[name]

## 2022-06-03 ENCOUNTER — Encounter (HOSPITAL_BASED_OUTPATIENT_CLINIC_OR_DEPARTMENT_OTHER): Payer: Medicare Other | Admitting: General Surgery

## 2022-06-03 DIAGNOSIS — Z09 Encounter for follow-up examination after completed treatment for conditions other than malignant neoplasm: Secondary | ICD-10-CM | POA: Diagnosis not present

## 2022-06-03 NOTE — Progress Notes (Signed)
Erin [name], Erin [name] ([id number]) Visit Report for 06/03/2022 Chief Complaint Document Details Patient Name: Date of Service: BLA CKA RD, CA RO L F. 06/03/2022 7:45 A M Medical Record Number: [id number] Patient Account Number: [id number] Date of Birth/Sex: Treating RN: 1956-03-21 (66 y.o. Female) [name] Primary Care Provider: [name] Other Clinician: Referring Provider: Treating Provider/Extender: [name] in Treatment: 2 Information Obtained from: Patient Chief Complaint Patient presents for treatment of an open ulcer due to venous insufficiency Electronic Signature(s) Signed: 06/03/2022 8:00:10 AM By: [name] MD FACS Entered By: [name] on 06/03/2022 08:00:10 -------------------------------------------------------------------------------- Debridement Details Patient Name: Date of Service: BLA CKA RD, CA RO L F. 06/03/2022 7:45 A M Medical Record Number: [id number] Patient Account Number: [id number] Date of Birth/Sex: Treating RN: 01-Dec-1955 (66 y.o. Female) [name] Primary Care Provider: [name] Other Clinician: Referring Provider: Treating Provider/Extender: [name] in Treatment: 2 Debridement Performed for Assessment: Wound #1 Left,Proximal,Anterior Lower Leg Performed By: Physician [name], MD Debridement Type: Debridement Severity of Tissue Pre Debridement: Fat layer exposed Level of Consciousness (Pre-procedure): Awake and Alert Pre-procedure Verification/Time Out Yes - 07:53 Taken: Start Time: 07:53 Pain Control: Lidocaine 4% T opical Solution T Area Debrided (L x W): otal 0.5 (cm) x 0.5 (cm) = 0.25 (cm) Tissue and other material debrided: Non-Viable, Eschar Level: Non-Viable Tissue Debridement Description: Selective/Open Wound Instrument: Curette Bleeding: Minimum Hemostasis Achieved: Pressure Procedural Pain: 0 Post Procedural Pain:  0 Response to Treatment: Procedure was tolerated well Level of Consciousness (Post- Awake and Alert procedure): Post Debridement Measurements of Total Wound Length: (cm) 0.1 Width: (cm) 0.1 Depth: (cm) 0.1 Volume: (cm) 0.001 Character of Wound/Ulcer Post Debridement: Improved Severity of Tissue Post Debridement: Fat layer exposed Post Procedure Diagnosis Same as Pre-procedure Electronic Signature(s) Signed: 06/03/2022 8:42:00 AM By: [name] MD FACS Signed: 06/03/2022 4:17:55 PM By: [name] Entered By: [name] on 06/03/2022 07:57:00 -------------------------------------------------------------------------------- HPI Details Patient Name: Date of Service: BLA CKA RD, CA RO L F. 06/03/2022 7:45 A M Medical Record Number: [id number] Patient Account Number: [id number] Date of Birth/Sex: Treating RN: 10-11-1956 (66 y.o. Female) [name] Primary Care Provider: [name] Other Clinician: Referring Provider: Treating Provider/Extender: [name] in Treatment: 2 History of Present Illness HPI Description: ADMISSION This is a 66 year old obese diabetic patient who has a history of right below-knee amputation. She has venous insufficiency on the left lower leg. It has been present for about 8 weeks. She presented to her primary care provider's office on May 13, 2022 with an increase in redness, oozing, and bleeding. At the time, she was not covering the wound. She was prescribed a 10-day course of Bactrim and referred to the wound care center for further evaluation and management. I do not have a recent hemoglobin A1c available for review; the last 1 available in her electronic medical record was 5.8, but this was over 5 years ago. The notes provided from her family doctor do not include this information. The patient reports that her last A1c was about 6 months ago and was 5.4. She does smoke about a pack of cigarettes  daily. ABI in clinic was diminished at 0.85. On her left lower extremity, she has 2 ulcers on the anterior tibial surface, as well as another ulcer on the posterior calf. The posterior lesion is nearly epithelialized. The anterior lesions expose the fat layer. There is some slough accumulation in both sites. No odor or purulent drainage. She does  have about 1+ pitting edema. She does not wear compression stockings. 7/14; 2 or 3 of the 3 wounds she had on her right leg including the posterior and distal anterior wounds are closed. She had 1 remaining wound area. She has been using silver alginate under kerlix Coban 06/03/2022: The 1 remaining wound has a layer of eschar over it. Underneath, there is just a small open portion remaining. No odor or drainage; no concern for infection. We are using silver alginate with Kerlix/Coban wrap. Her arterial studies are scheduled for tomorrow. Electronic Signature(s) Signed: 06/03/2022 8:01:12 AM By: [name] MD FACS Entered By: [name] on 06/03/2022 08:01:12 -------------------------------------------------------------------------------- Physical Exam Details Patient Name: Date of Service: BLA CKA RD, CA RO L F. 06/03/2022 7:45 A M Medical Record Number: [id number] Patient Account Number: [id number] Date of Birth/Sex: Treating RN: 18-Aug-1956 (66 y.o. Female) [name] Primary Care Provider: [name] Other Clinician: Referring Provider: Treating Provider/Extender: [name] Weeks in Treatment: 2 Constitutional Within normal range for this patient. . . . No acute distress.Marland Kitchen Respiratory Normal work of breathing on room air.. Notes 06/03/2022: The 1 remaining wound has a layer of eschar over it. Underneath, there is just a small open portion remaining. No odor or drainage; no concern for infection. Electronic Signature(s) Signed: 06/03/2022 8:01:43 AM By: [name] MD FACS Entered By: [name] on 06/03/2022 08:01:43 -------------------------------------------------------------------------------- Physician Orders Details Patient Name: Date of Service: BLA CKA RD, CA RO L F. 06/03/2022 7:45 A M Medical Record Number: [id number] Patient Account Number: [id number] Date of Birth/Sex: Treating RN: 08-Nov-1956 (66 y.o. 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 - Dr. Lady Gary - room 2 - 7/28 at 3:00 PM Return A Bathing/ Shower/ Hygiene May shower with protection but do not get wound dressing(s) wet. Edema Control - Lymphedema / SCD / Other Elevate legs to the level of the heart or above for 30 minutes daily and/or when sitting, a frequency of: - throughout the day Avoid standing for long periods of time. Exercise regularly Compression stocking or Garment 20-30 mm/Hg pressure to: - ***leg measurement provided. Purchase stockings bring in next week. Wound Treatment Wound #1 - Lower Leg Wound Laterality: Left, Anterior, Proximal Peri-Wound Care: Sween Lotion (Moisturizing lotion) 1 x Per Week/30 Days Discharge Instructions: Apply moisturizing lotion as directed Prim Dressing: KerraCel Ag Gelling Fiber Dressing, 2x2 in (silver alginate) 1 x Per Week/30 Days ary Discharge Instructions: Apply silver alginate to wound  bed as instructed Secondary Dressing: Woven Gauze Sponge, Non-Sterile 4x4 in 1 x Per Week/30 Days Discharge Instructions: Apply over primary dressing as directed. Compression Wrap: Kerlix Roll 4.5x3.1 (in/yd) 1 x Per Week/30 Days Discharge Instructions: Apply Kerlix and Coban compression as directed. Compression Wrap: Coban Self-Adherent Wrap 4x5 (in/yd) 1 x Per Week/30 Days Discharge Instructions: Apply over Kerlix as directed. Electronic Signature(s) Signed: 06/03/2022 8:42:00 AM By: [name] MD FACS Entered By: [name] on 06/03/2022 08:01:53 -------------------------------------------------------------------------------- Problem List Details Patient Name: Date of  Service: BLA CKA RD, CA RO L F. 06/03/2022 7:45 A M Medical Record Number: [id number] Patient Account Number: [id number] Date of Birth/Sex: Treating RN: 11-Apr-1956 (66 y.o. 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 05/20/2022 No Yes E11.622 Type 2 diabetes mellitus with other skin ulcer 05/20/2022 No Yes I87.2 Venous insufficiency (chronic) (peripheral) 05/20/2022 No Yes I10 Essential (primary) hypertension 05/20/2022 No Yes I27.20 Pulmonary hypertension, unspecified 05/20/2022 No Yes Z89.511 Acquired absence of right leg below knee 05/20/2022 No Yes Inactive Problems Resolved Problems Electronic Signature(s) Signed: 06/03/2022 7:58:17 AM By: [name] MD FACS Entered By: [name] on 06/03/2022 07:58:17 -------------------------------------------------------------------------------- Progress  Note Details Patient Name: Date of Service: BLA CKA RD, CA RO L F. 06/03/2022 7:45 A M Medical Record Number: [id number] Patient Account Number: [id number] Date of Birth/Sex: Treating RN: 06-09-1956 (65 y.o. Female) [name] Primary Care Provider: [name] Other Clinician: Referring Provider: Treating Provider/Extender: [name] in Treatment: 2 Subjective Chief Complaint Information obtained from Patient Patient presents for treatment of an open ulcer due to venous insufficiency History of Present Illness (HPI) ADMISSION This is a 66 year old obese diabetic patient who has a history of right below-knee amputation. She has venous insufficiency on the left lower leg. It has been present for about 8 weeks. She presented to her primary care provider's office on May 13, 2022 with an increase in redness, oozing, and bleeding. At the time, she was not covering the wound. She was prescribed a 10-day course of Bactrim and referred to the wound care center for further evaluation and management. I do not have a recent hemoglobin A1c available for review; the last 1 available in her electronic medical record was 5.8, but this was over 5 years ago. The notes provided from her family doctor do not include this information. The patient reports that her last A1c was about 6 months ago and was 5.4. She does smoke about a pack of cigarettes daily. ABI in clinic was diminished at 0.85. On her left lower extremity, she has 2 ulcers on the anterior tibial surface, as well as another ulcer on the posterior calf. The posterior lesion is nearly epithelialized. The anterior lesions expose the fat layer. There is some slough accumulation in both sites. No odor or purulent drainage. She does have about 1+ pitting edema. She does not wear compression stockings. 7/14; 2 or 3 of the 3 wounds she had on her right leg including the posterior and distal anterior wounds are  closed. She had 1 remaining wound area. She has been using silver alginate under kerlix Coban 06/03/2022: The 1 remaining wound has a layer of eschar over it. Underneath, there is just a small open portion remaining. No odor or drainage; no concern for infection. We are using silver alginate with Kerlix/Coban wrap. Her arterial studies are scheduled for tomorrow. Patient History Information obtained from Patient, Chart. Family History Cancer - Mother, Heart Disease - Father, Hypertension - Father, Stroke - Maternal Grandparents, No family history of Diabetes, Hereditary Spherocytosis, Kidney Disease, Lung Disease, Seizures, Thyroid Problems, Tuberculosis. Social History Current every day smoker - 1 ppd, Marital Status - Married, Alcohol Use - Never, Drug Use - No  History, Caffeine Use - Daily - tea. Medical History Eyes Denies history of Cataracts, Glaucoma, Optic Neuritis Cardiovascular Patient has history of Hypertension, Peripheral Arterial Disease, Peripheral Venous Disease Endocrine Patient has history of Type II Diabetes Genitourinary Denies history of End Stage Renal Disease Integumentary (Skin) Denies history of History of Burn Musculoskeletal Patient has history of Gout - hx, Osteomyelitis - hx right foot Neurologic Patient has history of Neuropathy Oncologic Denies history of Received Chemotherapy, Received Radiation Psychiatric Denies history of Anorexia/bulimia, Confinement Anxiety Hospitalization/Surgery History - right BKA. - vaginal hysterectomy. - tubal ligation. - TMJ arthroscopy. - tonsillectomy. - umbilical hernia repair. Medical A Surgical History Notes nd Constitutional Symptoms (General Health) obesity Cardiovascular pulmonary hypertension Endocrine hypothyroidism Genitourinary CKD stage 3 Musculoskeletal fibromyalgia Objective Constitutional Within normal range for this patient. No acute distress.. Vitals Time Taken: 7:41 AM, Height: 63 in, Weight:  233 lbs, BMI: 41.3, Temperature: 98.3 F, Pulse: 67 bpm, Respiratory Rate: 20 breaths/min, Blood Pressure: 93/67 mmHg, Capillary Blood Glucose: 150 mg/dl. Respiratory Normal work of breathing on room air.. General Notes: 06/03/2022: The 1 remaining wound has a layer of eschar over it. Underneath, there is just a small open portion remaining. No odor or drainage; no concern for infection. Integumentary (Hair, Skin) Wound #1 status is Open. Original cause of wound was Gradually Appeared. The date acquired was: 04/08/2022. The wound has been in treatment 2 weeks. The wound is located on the Left,Proximal,Anterior Lower Leg. The wound measures 0.1cm length x 0.1cm width x 0.1cm depth; 0.008cm^2 area and 0.001cm^3 volume. There is Fat Layer (Subcutaneous Tissue) exposed. There is no tunneling or undermining noted. There is a none present amount of drainage noted. The wound margin is distinct with the outline attached to the wound base. There is large (67-100%) red granulation within the wound bed. There is a small (1-33%) amount of necrotic tissue within the wound bed including Eschar. 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 There was a Selective/Open Wound Non-Viable Tissue Debridement with a total area of 0.25 sq cm performed by [name], MD. With the following instrument(s): Curette to remove Non-Viable tissue/material. Material removed includes Eschar after  achieving pain control using Lidocaine 4% Topical Solution. No specimens were taken. A time out was conducted at 07:53, prior to the start of the procedure. A Minimum amount of bleeding was controlled with Pressure. The procedure was tolerated well with a pain level of 0 throughout and a pain level of 0 following the procedure. Post Debridement Measurements: 0.1cm length x 0.1cm width x 0.1cm depth; 0.001cm^3 volume. Character of Wound/Ulcer Post Debridement is improved. Severity of Tissue Post Debridement is: Fat layer exposed. Post procedure Diagnosis Wound #1: Same as Pre-Procedure Plan Follow-up Appointments: Return Appointment in 1 week. - Dr. Celine Ahr - room 2 - 7/28 at 3:00 PM Bathing/ Shower/ Hygiene: May shower with protection but do not get wound dressing(s) wet. Edema Control - Lymphedema / SCD / Other: Elevate legs to the level of the heart or above for 30 minutes daily and/or when sitting, a frequency of: - throughout the  day Avoid standing for long periods of time. Exercise regularly Compression stocking or Garment 20-30 mm/Hg pressure to: - ***leg measurement provided. Purchase stockings bring in next week. WOUND #1: - Lower Leg Wound Laterality: Left, Anterior, Proximal Peri-Wound Care: Sween Lotion (Moisturizing lotion) 1 x Per Week/30 Days Discharge Instructions: Apply moisturizing lotion as directed Prim Dressing: KerraCel Ag Gelling Fiber Dressing, 2x2 in (silver alginate) 1 x Per Week/30 Days ary Discharge Instructions: Apply silver alginate to wound bed as instructed Secondary Dressing: Woven Gauze Sponge, Non-Sterile 4x4 in 1 x Per Week/30 Days Discharge Instructions: Apply over primary dressing as directed. Com pression Wrap: Kerlix Roll 4.5x3.1 (in/yd) 1 x Per Week/30 Days Discharge Instructions: Apply Kerlix and Coban compression as directed. Com pression Wrap: Coban Self-Adherent Wrap 4x5 (in/yd) 1 x Per Week/30 Days Discharge Instructions: Apply over Kerlix as  directed. 06/03/2022: The 1 remaining wound has a layer of eschar over it. Underneath, there is just a small open portion remaining. No odor or drainage; no concern for infection. I used a curette to debride the eschar from the wound. It is very nearly closed. We will continue using silver alginate with Kerlix/Coban wrap. She has ordered a compression stocking from [facility] in [place], but has not yet received it. Follow-up in 1 week. Electronic Signature(s) Signed: 06/03/2022 8:02:31 AM By: [name] MD FACS Entered By: [name] on 06/03/2022 08:02:31 -------------------------------------------------------------------------------- HxROS Details Patient Name: Date of Service: BLA CKA RD, CA RO L F. 06/03/2022 7:45 A M Medical Record Number: [id number] Patient Account Number: [id number] Date of Birth/Sex: Treating RN: August 27, 1956 (66 y.o. Female) [name] Primary Care Provider: [name] Other Clinician: Referring Provider: Treating Provider/Extender: Tye [place] in Treatment: 2 Information Obtained From Patient Chart Constitutional Symptoms (General Health) Medical History: Past Medical History Notes: obesity Eyes Medical History: Negative for: Cataracts; Glaucoma; Optic Neuritis Cardiovascular Medical History: Positive for: Hypertension; Peripheral Arterial Disease; Peripheral Venous Disease Past Medical History Notes: pulmonary hypertension Endocrine Medical History: Positive for: Type II Diabetes Past Medical History Notes: hypothyroidism Time with diabetes: 10 yrs Treated with: Oral agents Blood sugar tested every day: Yes Tested : once Genitourinary Medical History: Negative for: End Stage Renal Disease Past Medical History Notes: CKD stage 3 Integumentary (Skin) Medical History: Negative for: History of Burn Musculoskeletal Medical History: Positive for: Gout - hx; Osteomyelitis - hx right  foot Past Medical History Notes: fibromyalgia Neurologic Medical History: Positive for: Neuropathy Oncologic Medical History: Negative for: Received Chemotherapy; Received Radiation Psychiatric Medical History: Negative for: Anorexia/bulimia; Confinement Anxiety Immunizations Pneumococcal Vaccine: Received Pneumococcal Vaccination: Yes Received Pneumococcal Vaccination On or After 60th Birthday: Yes Implantable Devices None Hospitalization / Surgery History Type of Hospitalization/Surgery right BKA vaginal hysterectomy tubal ligation TMJ arthroscopy tonsillectomy umbilical hernia repair Family and Social History Cancer: Yes - Mother; Diabetes: No; Heart Disease: Yes - Father; Hereditary Spherocytosis: No; Hypertension: Yes - Father; Kidney Disease: No; Lung Disease: No; Seizures: No; Stroke: Yes - Maternal Grandparents; Thyroid Problems: No; Tuberculosis: No; Current every day smoker - 1 ppd; Marital Status - Married; Alcohol Use: Never; Drug Use: No History; Caffeine Use: Daily - tea; Financial Concerns: No; Food, Clothing or Shelter Needs: No; Support System Lacking: No; Transportation Concerns: No Electronic Signature(s) Signed: 06/03/2022 8:42:00 AM By: [name] MD FACS Signed: 06/03/2022 4:17:55 PM By: [name] Entered By: [name] on 06/03/2022 08:01:18 -------------------------------------------------------------------------------- SuperBill Details Patient Name: Date of Service: BLA CKA RD, CA RO L F. 06/03/2022 Medical Record Number: [id number] Patient  Account Number: [id number] Date of Birth/Sex: Treating RN: Mar 30, 1956 (66 y.o. 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 E11.622 Type 2 diabetes mellitus with other skin ulcer I87.2 Venous insufficiency (chronic) (peripheral) I10 Essential (primary) hypertension I27.20 Pulmonary hypertension, unspecified Z89.511 Acquired absence of right leg below knee Facility Procedures Physician Procedures : CPT4 Code Description Modifier [id number] - WC PHYS LEVEL 3 - EST PT 25 ICD-10 Diagnosis Description L97.829 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ity I83.029 Varicose veins of left lower extremity with  ulcer of unspecified site E11.622 Type 2 diabetes mellitus with other skin ulcer I87.2 Venous insufficiency (chronic) (peripheral) Quantity: 1 : [id number] - WC PHYS DEBR WO ANESTH 20 SQ CM ICD-10 Diagnosis Description L97.829 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ity Quantity: 1 Electronic Signature(s) Signed: 06/03/2022 8:02:53 AM By: [name] MD FACS Entered By: [name] on 06/03/2022 08:02:53

## 2022-06-03 NOTE — Progress Notes (Signed)
STELLAROSE, CERNY ([id number]) Visit Report for 06/03/2022 Arrival Information Details Patient Name: Date of Service: BLA CKA RD, [place] 06/03/2022 7:45 A M Medical Record Number: [id number] Patient Account Number: [id number] Date of Birth/Sex: Treating RN: Apr 27, 1956 (66 y.o. Female) [name] Primary Care Marciana Uplinger: [name] Other Clinician: Referring Nelwyn Hebdon: Treating Thao Vanover/Extender: Tye [place] in Treatment: 2 Visit Information History Since Last Visit Added or deleted any medications: No Patient Arrived: Gilmer Mor Any new allergies or adverse reactions: No Arrival Time: 07:38 Had a fall or experienced change in No Accompanied By: partner activities of daily living that may affect Transfer Assistance: None risk of falls: Patient Identification Verified: Yes Signs or symptoms of abuse/neglect since last visito No Secondary Verification Process Completed: Yes Hospitalized since last visit: No Patient Requires Transmission-Based Precautions: No Implantable device outside of the clinic excluding No Patient Has Alerts: No cellular tissue based products placed in the center since last visit: Has Dressing in Place as Prescribed: Yes Has Compression in Place as Prescribed: Yes Pain Present Now: Yes Electronic Signature(s) Signed: 06/03/2022 4:17:55 PM By: [name] Entered By: [name] on 06/03/2022 07:41:20 -------------------------------------------------------------------------------- Encounter Discharge Information Details Patient Name: Date of Service: BLA CKA RD, [place] RO L F. 06/03/2022 7:45 A M Medical Record Number: [id number] Patient Account Number: [id number] Date of Birth/Sex: Treating RN: [birth year] (66 y.o. Female) [name] Primary Care Genia Perin: [name] Other Clinician: Referring Emrie Gayle: Treating Carnel Stegman/Extender: Tye [place] in Treatment: 2 Encounter  Discharge Information Items Post Procedure Vitals Discharge Condition: Stable Temperature (F): 98.3 Ambulatory Status: Cane Pulse (bpm): 67 Discharge Destination: Home Respiratory Rate (breaths/min): 20 Transportation: Private Auto Blood Pressure (mmHg): 93/67 Accompanied By: partner Schedule Follow-up Appointment: Yes Clinical Summary of Care: Patient Declined Electronic Signature(s) Signed: 06/03/2022 4:17:55 PM By: [name] Entered By: [name] on 06/03/2022 08:20:26 -------------------------------------------------------------------------------- Lower Extremity Assessment Details Patient Name: Date of Service: BLA CKA RD, [place] RO [place]. 06/03/2022 7:45 A M Medical Record Number: [id number] Patient Account Number: [id number] Date of Birth/Sex: Treating RN: 07-Apr-1956 (66 y.o. Female) [name] Primary Care Nakyiah Kuck: [name] Other Clinician: Referring Nikolis Berent: Treating Jake Fuhrmann/Extender: [name] Weeks in Treatment: 2 Edema Assessment Assessed: [Left: No] [Right: No] Edema: [Left: N] [Right: o] Calf Left: Right: Point of Measurement: 31 cm From Medial Instep 36.2 cm Ankle Left: Right: Point of Measurement: 12 cm From Medial Instep 21.3 cm Vascular Assessment Pulses: Dorsalis Pedis Palpable: [Left:Yes] Electronic Signature(s) Signed: 06/03/2022 4:17:55 PM By: [name] Entered By: [name] on 06/03/2022 07:45:53 -------------------------------------------------------------------------------- Multi Wound Chart Details Patient Name: Date of Service: BLA CKA RD, [place] RO [place] F. 06/03/2022 7:45 A M Medical Record Number: [id number] Patient Account Number: [id number] Date of Birth/Sex: Treating RN: 02-27-1956 (66 y.o. Female) [name] Primary Care Romen Yutzy: [name] Other Clinician: Referring Derward Marple: Treating Jashae Wiggs/Extender: [name] Weeks in  Treatment: 2 Vital Signs Height(in): 63 Capillary Blood Glucose(mg/dl): 833 Weight(lbs): 825 Pulse(bpm): 67 Body Mass Index(BMI): 41.3 Blood Pressure(mmHg): 93/67 Temperature(F): 98.3 Respiratory Rate(breaths/min): 20 Photos: [N/A:N/A] Left, Proximal, Anterior Lower Leg N/A N/A Wound Location: Gradually Appeared N/A N/A Wounding Event: Venous Leg Ulcer N/A N/A Primary Etiology: Hypertension, Peripheral Arterial N/A N/A Comorbid History: Disease, Peripheral Venous Disease, Type II Diabetes, Gout, Osteomyelitis, Neuropathy 04/08/2022 N/A N/A Date Acquired: 2 N/A N/A Weeks of Treatment: Open N/A N/A Wound Status: No N/A N/A Wound Recurrence: 0.1x0.1x0.1 N/A N/A Measurements L x W x D (cm) 0.008 N/A N/A  A (cm) : rea 0.001 N/A N/A Volume (cm) : 99.70% N/A N/A % Reduction in A rea: 99.60% N/A N/A % Reduction in Volume: Full Thickness Without Exposed N/A N/A Classification: Support Structures None Present N/A N/A Exudate A mount: Distinct, outline attached N/A N/A Wound Margin: Large (67-100%) N/A N/A Granulation A mount: Red N/A N/A Granulation Quality: Small (1-33%) N/A N/A Necrotic A mount: Eschar N/A N/A Necrotic Tissue: Fat Layer (Subcutaneous Tissue): Yes N/A N/A Exposed Structures: Fascia: No Tendon: No Muscle: No Joint: No Bone: No Large (67-100%) N/A N/A Epithelialization: Debridement - Selective/Open Wound N/A N/A Debridement: Pre-procedure Verification/Time Out 07:53 N/A N/A Taken: Lidocaine 4% Topical Solution N/A N/A Pain Control: Necrotic/Eschar N/A N/A Tissue Debrided: Non-Viable Tissue N/A N/A Level: 0.25 N/A N/A Debridement A (sq cm): rea Curette N/A N/A Instrument: Minimum N/A N/A Bleeding: Pressure N/A N/A Hemostasis A chieved: 0 N/A N/A Procedural Pain: 0 N/A N/A Post Procedural Pain: Procedure was tolerated well N/A N/A Debridement Treatment Response: 0.1x0.1x0.1 N/A N/A Post Debridement Measurements L x W x  D (cm) 0.001 N/A N/A Post Debridement Volume: (cm) Debridement N/A N/A Procedures Performed: Treatment Notes Electronic Signature(s) Signed: 06/03/2022 8:00:02 AM By: [name] MD FACS Signed: 06/03/2022 4:17:55 PM By: [name] Entered By: [name] on 06/03/2022 08:00:02 -------------------------------------------------------------------------------- Multi-Disciplinary Care Plan Details Patient Name: Date of Service: BLA CKA RD, [place] RO L F. 06/03/2022 7:45 A M Medical Record Number: [id number] Patient Account Number: [id number] Date of Birth/Sex: Treating RN: [birth year] (66 y.o. Female) [name] Primary Care Wisdom Seybold: [name] Other Clinician: Referring Fusaye Wachtel: Treating Charlotte Brafford/Extender: Tye [place] in Treatment: 2 Multidisciplinary Care Plan reviewed with physician Active Inactive Nutrition Nursing Diagnoses: Impaired glucose control: actual or potential Potential for alteratiion in Nutrition/Potential for imbalanced nutrition Goals: Patient/caregiver will maintain therapeutic glucose control Date Initiated: 05/20/2022 Target Resolution Date: 06/17/2022 Goal Status: Active Interventions: Assess HgA1c results as ordered upon admission and as needed Provide education on elevated blood sugars and impact on wound healing Treatment Activities: Giving encouragement to exercise : 05/20/2022 Patient referred to Primary Care Physician for further nutritional evaluation : 05/20/2022 Notes: Wound/Skin Impairment Nursing Diagnoses: Impaired tissue integrity Knowledge deficit related to smoking impact on wound healing Knowledge deficit related to ulceration/compromised skin integrity Goals: Patient will demonstrate a reduced rate of smoking or cessation of smoking Date Initiated: 05/20/2022 Target Resolution Date: 06/17/2022 Goal Status: Active Patient/caregiver will verbalize understanding of skin care regimen Date  Initiated: 05/20/2022 Target Resolution Date: 06/17/2022 Goal Status: Active Ulcer/skin breakdown will have a volume reduction of 30% by week 4 Date Initiated: 05/20/2022 Target Resolution Date: 06/17/2022 Goal Status: Active Interventions: Assess patient/caregiver ability to obtain necessary supplies Assess patient/caregiver ability to perform ulcer/skin care regimen upon admission and as needed Assess ulceration(s) every visit Provide education on ulcer and skin care Treatment Activities: Skin care regimen initiated : 05/20/2022 Topical wound management initiated : 05/20/2022 Notes: Electronic Signature(s) Signed: 06/03/2022 4:17:55 PM By: [name] Entered By: [name] on 06/03/2022 07:53:27 -------------------------------------------------------------------------------- Pain Assessment Details Patient Name: Date of Service: BLA CKA RD, [place] RO L F. 06/03/2022 7:45 A M Medical Record Number: [id number] Patient Account Number: [id number] Date of Birth/Sex: Treating RN: 02/09/1956 (66 y.o. Female) [name] Primary Care Amsi Grimley: [name] Other Clinician: Referring Canisha Issac: Treating Sendy Pluta/Extender: [name] Weeks in Treatment: 2 Active Problems Location of Pain Severity and Description of Pain Patient Has Paino Yes Site Locations Duration of the Pain. Duration of the Pain. Constant /  Intermittento Constant Rate the pain. Current Pain Level: 8 Character of Pain Describe the Pain: Other: neuropathy Pain Management and Medication Current Pain Management: Electronic Signature(s) Signed: 06/03/2022 4:17:55 PM By: [name] Entered By: [name] on 06/03/2022 07:42:41 -------------------------------------------------------------------------------- Patient/Caregiver Education Details Patient Name: Date of Service: BLA CKA RD, [place] RO L F. 7/20/2023andnbsp7:45 A M Medical Record Number: [id number] Patient  Account Number: [id number] Date of Birth/Gender: Treating RN: 22-Feb-1956 (66 y.o. Female) [name] Primary Care Physician: [name] Other Clinician: Referring Physician: Treating Physician/Extender: Tye [place] in Treatment: 2 Education Assessment Education Provided To: Patient Education Topics Provided Wound/Skin Impairment: Methods: Explain/Verbal Responses: Reinforcements needed, State content correctly Electronic Signature(s) Signed: 06/03/2022 4:17:55 PM By: [name] Entered By: [name] on 06/03/2022 07:51:01 -------------------------------------------------------------------------------- Wound Assessment Details Patient Name: Date of Service: BLA CKA RD, [place] RO L F. 06/03/2022 7:45 A M Medical Record Number: [id number] Patient Account Number: [id number] Date of Birth/Sex: Treating RN: 11/19/1955 (66 y.o. Female) [name] Primary Care Jakylan Ron: Other Clinician: [name] Referring Joel Mericle: Treating Mikella Linsley/Extender: [name] Weeks in Treatment: 2 Wound Status Wound Number: 1 Primary Venous Leg Ulcer Etiology: Wound Location: Left, Proximal, Anterior Lower Leg Wound Open Wounding Event: Gradually Appeared Status: Date Acquired: 04/08/2022 Comorbid Hypertension, Peripheral Arterial Disease, Peripheral Venous Weeks Of Treatment: 2 History: Disease, Type II Diabetes, Gout, Osteomyelitis, Neuropathy Clustered Wound: No Photos Wound Measurements Length: (cm) 0.1 Width: (cm) 0.1 Depth: (cm) 0.1 Area: (cm) 0.008 Volume: (cm) 0.001 % Reduction in Area: 99.7% % Reduction in Volume: 99.6% Epithelialization: Large (67-100%) Tunneling: No Undermining: No Wound Description Classification: Full Thickness Without Exposed Support Structures Wound Margin: Distinct, outline attached Exudate Amount: None Present Foul Odor After Cleansing: No Slough/Fibrino  No Wound Bed Granulation Amount: Large (67-100%) Exposed Structure Granulation Quality: Red Fascia Exposed: No Necrotic Amount: Small (1-33%) Fat Layer (Subcutaneous Tissue) Exposed: Yes Necrotic Quality: Eschar Tendon Exposed: No Muscle Exposed: No Joint Exposed: No Bone Exposed: No Treatment Notes Wound #1 (Lower Leg) Wound Laterality: Left, Anterior, Proximal Cleanser Peri-Wound Care Sween Lotion (Moisturizing lotion) Discharge Instruction: Apply moisturizing lotion as directed Topical Primary Dressing KerraCel Ag Gelling Fiber Dressing, 2x2 in (silver alginate) Discharge Instruction: Apply silver alginate to wound bed as instructed Secondary Dressing Woven Gauze Sponge, Non-Sterile 4x4 in Discharge Instruction: Apply over primary dressing as directed. Secured With Compression Wrap Kerlix Roll 4.5x3.1 (in/yd) Discharge Instruction: Apply Kerlix and Coban compression as directed. Coban Self-Adherent Wrap 4x5 (in/yd) Discharge Instruction: Apply over Kerlix as directed. Compression Stockings Add-Ons Electronic Signature(s) Signed: 06/03/2022 4:17:55 PM By: [name] Entered By: [name] on 06/03/2022 07:49:46 -------------------------------------------------------------------------------- Vitals Details Patient Name: Date of Service: BLA CKA RD, [place] RO L F. 06/03/2022 7:45 A M Medical Record Number: [id number] Patient Account Number: [id number] Date of Birth/Sex: Treating RN: [birth year] (66 y.o. Female) [name] Primary Care Brandom Kerwin: [name] Other Clinician: Referring Rhyker Silversmith: Treating Ivon Roedel/Extender: [name] Weeks in Treatment: 2 Vital Signs Time Taken: 07:41 Temperature (F): 98.3 Height (in): 63 Pulse (bpm): 67 Weight (lbs): 233 Respiratory Rate (breaths/min): 20 Body Mass Index (BMI): 41.3 Blood Pressure (mmHg): 93/67 Capillary Blood Glucose (mg/dl): 239 Reference Range: 80 - 120 mg /  dl Electronic Signature(s) Signed: 06/03/2022 4:17:55 PM By: [name] Entered By: [name] on 06/03/2022 07:42:23

## 2022-06-04 ENCOUNTER — Ambulatory Visit (HOSPITAL_COMMUNITY)
Admission: RE | Admit: 2022-06-04 | Discharge: 2022-06-04 | Disposition: A | Discharge status: Home / Self Care | Payer: Medicare Other | Source: Ambulatory Visit | Attending: Cardiovascular Disease | Admitting: Cardiovascular Disease

## 2022-06-04 DIAGNOSIS — I83228 Varicose veins of left lower extremity with both ulcer of other part of lower extremity and inflammation: Secondary | ICD-10-CM | POA: Insufficient documentation

## 2022-06-04 DIAGNOSIS — L97829 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ity: Secondary | ICD-10-CM | POA: Diagnosis present

## 2022-06-04 DIAGNOSIS — L98499 Non-pressure chronic ulcer of skin of other sites with unspecified severity: Secondary | ICD-10-CM | POA: Insufficient documentation

## 2022-06-04 DIAGNOSIS — E11622 Type 2 diabetes mellitus with other skin ulcer: Secondary | ICD-10-CM | POA: Insufficient documentation

## 2022-06-04 DIAGNOSIS — L97828 Non-pressure chronic ulcer of other part of left lower leg with other specified severity: Secondary | ICD-10-CM

## 2022-06-11 ENCOUNTER — Encounter (HOSPITAL_BASED_OUTPATIENT_CLINIC_OR_DEPARTMENT_OTHER): Payer: Medicare Other | Admitting: General Surgery

## 2022-06-11 DIAGNOSIS — Z09 Encounter for follow-up examination after completed treatment for conditions other than malignant neoplasm: Secondary | ICD-10-CM | POA: Diagnosis not present

## 2022-06-11 NOTE — Progress Notes (Signed)
Erin [name], Erin [name] ([id number]) Visit Report for 06/11/2022 Chief Complaint Document Details Patient Name: Date of Service: BLA CKA RD, CA RO L F. 06/11/2022 3:00 PM Medical Record Number: [id number] Patient Account Number: [id number] Date of Birth/Sex: Treating RN: 16-Mar-1956 (66 y.o. Harlow Ohms Primary Care Provider: [name] Other Clinician: Referring Provider: Treating Provider/Extender: Rosetta Posner in Treatment: 3 Information Obtained from: Patient Chief Complaint Patient presents for treatment of an open ulcer due to venous insufficiency Electronic Signature(s) Signed: 06/11/2022 3:14:24 PM By: [name] MD FACS Entered By: [name] on 06/11/2022 15:14:24 -------------------------------------------------------------------------------- HPI Details Patient Name: Date of Service: BLA CKA RD, CA RO L F. 06/11/2022 3:00 PM Medical Record Number: [id number] Patient Account Number: [id number] Date of Birth/Sex: Treating RN: [birth year] (66 y.o. Harlow Ohms Primary Care Provider: [name] Other Clinician: Referring Provider: Treating Provider/Extender: Rosetta Posner in Treatment: 3 History of Present Illness HPI Description: ADMISSION This is a 66 year old obese diabetic patient who has a history of right below-knee amputation. She has venous insufficiency on the left lower leg. It has been present for about 8 weeks. She presented to her primary care provider's office on May 13, 2022 with an increase in redness, oozing, and bleeding. At the time, she was not covering the wound. She was prescribed a 10-day course of Bactrim and referred to the wound care center for further evaluation and management. I do not have a recent hemoglobin A1c available for review; the last 1 available in her electronic medical record was 5.8, but this was over 5 years ago. The notes provided from her family  doctor do not include this information. The patient reports that her last A1c was about 6 months ago and was 5.4. She does smoke about a pack of cigarettes daily. ABI in clinic was diminished at 0.85. On her left lower extremity, she has 2 ulcers on the anterior tibial surface, as well as another ulcer on the posterior calf. The posterior lesion is nearly epithelialized. The anterior lesions expose the fat layer. There is some slough accumulation in both sites. No odor or purulent drainage. She does have about 1+ pitting edema. She does not wear compression stockings. 7/14; 2 or 3 of the 3 wounds she had on her right leg including the posterior and distal anterior wounds are closed. She had 1 remaining wound area. She has been using silver alginate under kerlix Coban 06/03/2022: The 1 remaining wound has a layer of eschar over it. Underneath, there is just a small open portion remaining. No odor or drainage; no concern for infection. We are using silver alginate with Kerlix/Coban wrap. Her arterial studies are scheduled for tomorrow. 06/11/2022: Her wound has healed. ABIs were done last week and were normal aside from her toe pressures. Electronic Signature(s) Signed: 06/11/2022 3:15:27 PM By: [name] MD FACS Entered By: [name] on 06/11/2022 15:15:27 -------------------------------------------------------------------------------- Physical Exam Details Patient Name: Date of Service: BLA CKA RD, CA RO L F. 06/11/2022 3:00 PM Medical Record Number: [id number] Patient Account Number: [id number] Date of Birth/Sex: Treating RN: 1956/05/06 (66 y.o. Harlow Ohms Primary Care Provider: [name] Other Clinician: Referring Provider: Treating Provider/Extender: Rosetta Posner in Treatment: 3 Constitutional . . . . No acute distress.Marland Kitchen Respiratory Normal work of breathing on room air.. Notes 06/11/2022: Her wound is healed. Electronic  Signature(s) Signed: 06/11/2022 3:16:10 PM By: [name] MD FACS Entered By: [name] on 06/11/2022 15:16:10 -------------------------------------------------------------------------------- Physician Orders Details  Patient Name: Date of Service: BLA CKA RD, CA RO L F. 06/11/2022 3:00 PM Medical Record Number: [id number] Patient Account Number: [id number] Date of Birth/Sex: Treating RN: 1956/08/17 (66 y.o. 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 Edema Control - Lymphedema / SCD / Other Elevate legs to the level of the heart or above for 30 minutes daily and/or when sitting, a frequency of: - throughout the day Avoid standing for long periods of time. Exercise regularly Compression stocking or Garment 20-30 mm/Hg pressure to: Electronic Signature(s) Signed: 06/11/2022 3:16:20 PM By: [name] MD FACS Entered By: [name] on 06/11/2022 15:16:19 -------------------------------------------------------------------------------- Problem List Details Patient Name: Date of Service: BLA CKA RD, CA RO L F. 06/11/2022 3:00 PM Medical Record Number: [id number] Patient Account Number: [id number] Date of Birth/Sex: Treating RN: 1956/10/26 (66 y.o. 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 site 05/20/2022 No Yes E11.622 Type 2 diabetes mellitus with other skin ulcer 05/20/2022 No Yes I87.2 Venous insufficiency (chronic) (peripheral) 05/20/2022 No Yes I10 Essential (primary) hypertension 05/20/2022 No Yes I27.20 Pulmonary hypertension, unspecified 05/20/2022 No Yes Z89.511 Acquired absence of right leg below knee 05/20/2022 No Yes Inactive Problems Resolved Problems Electronic Signature(s) Signed: 06/11/2022 3:14:12 PM By: [name] MD FACS Entered By: [name] on 06/11/2022 15:14:11 -------------------------------------------------------------------------------- Progress Note Details Patient Name: Date of Service: BLA CKA RD, CA RO L F. 06/11/2022 3:00 PM Medical Record Number: [id number] Patient Account Number: [id number] Date of Birth/Sex: Treating RN: [birth year] (66 y.o. Harlow Ohms Primary Care Provider: [name] Other Clinician: Referring Provider: Treating Provider/Extender: Rosetta Posner in Treatment: 3 Subjective Chief Complaint Information obtained from Patient Patient presents for treatment of an open ulcer due to venous insufficiency History of Present Illness (HPI) ADMISSION This is a 66 year old obese diabetic patient who has a history of right below-knee amputation. She has venous insufficiency on the left lower leg. It has been present for about 8 weeks. She presented to her primary care provider's office on May 13, 2022 with an increase in redness, oozing, and bleeding. At the  time, she was not covering the wound. She was prescribed a 10-day course of Bactrim and referred to the wound care center for further evaluation and management. I do not have a recent hemoglobin A1c available for review; the last 1 available in her electronic medical record was 5.8, but this was over 5 years ago. The notes provided from her family doctor do not include this information. The patient reports that her last A1c was about 6 months ago and was 5.4. She does smoke about a pack of cigarettes daily. ABI in clinic was diminished at  0.85. On her left lower extremity, she has 2 ulcers on the anterior tibial surface, as well as another ulcer on the posterior calf. The posterior lesion is nearly epithelialized. The anterior lesions expose the fat layer. There is some slough accumulation in both sites. No odor or purulent drainage. She does have about 1+ pitting edema. She does not wear compression stockings. 7/14; 2 or 3 of the 3 wounds she had on her right leg including the posterior and distal anterior wounds are closed. She had 1 remaining wound area. She has been using silver alginate under kerlix Coban 06/03/2022: The 1 remaining wound has a layer of eschar over it. Underneath, there is just a small open portion remaining. No odor or drainage; no concern for infection. We are using silver alginate with Kerlix/Coban wrap. Her arterial studies are scheduled for tomorrow. 06/11/2022: Her wound has healed. ABIs were done last week and were normal aside from her toe pressures. Patient History Information obtained from Patient, Chart. Family History Cancer - Mother, Heart Disease - Father, Hypertension - Father, Stroke - Maternal Grandparents, No family history of Diabetes, Hereditary Spherocytosis, Kidney Disease, Lung Disease, Seizures, Thyroid Problems, Tuberculosis. Social History Current every day smoker - 1 ppd, Marital Status - Married, Alcohol Use - Never, Drug Use - No History, Caffeine  Use - Daily - tea. Medical History Eyes Denies history of Cataracts, Glaucoma, Optic Neuritis Cardiovascular Patient has history of Hypertension, Peripheral Arterial Disease, Peripheral Venous Disease Endocrine Patient has history of Type II Diabetes Genitourinary Denies history of End Stage Renal Disease Integumentary (Skin) Denies history of History of Burn Musculoskeletal Patient has history of Gout - hx, Osteomyelitis - hx right foot Neurologic Patient has history of Neuropathy Oncologic Denies history of Received Chemotherapy, Received Radiation Psychiatric Denies history of Anorexia/bulimia, Confinement Anxiety Hospitalization/Surgery History - right BKA. - vaginal hysterectomy. - tubal ligation. - TMJ arthroscopy. - tonsillectomy. - umbilical hernia repair. Medical A Surgical History Notes nd Constitutional Symptoms (General Health) obesity Cardiovascular pulmonary hypertension Endocrine hypothyroidism Genitourinary CKD stage 3 Musculoskeletal fibromyalgia Objective Constitutional No acute distress.. Vitals Time Taken: 2:58 AM, Height: 63 in, Weight: 233 lbs, BMI: 41.3, Temperature: 98.2 F, Pulse: 81 bpm, Respiratory Rate: 20 breaths/min, Blood Pressure: 111/65 mmHg, Capillary Blood Glucose: 106 mg/dl. Respiratory Normal work of breathing on room air.. General Notes: 06/11/2022: Her wound is healed. Integumentary (Hair, Skin) Wound #1 status is Open. Original cause of wound was Gradually Appeared. The date acquired was: 04/08/2022. The wound has been in treatment 3 weeks. The wound is located on the Left,Proximal,Anterior Lower Leg. The wound measures 0cm length x 0cm width x 0cm depth; 0cm^2 area and 0cm^3 volume. There is no tunneling or undermining noted. There is a none present amount of drainage noted. The wound margin is distinct with the outline attached to the wound base. There is no granulation within the wound bed. There is no necrotic tissue within the  wound bed. 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 Edema Control - Lymphedema / SCD / Other: Elevate legs to the level of the heart or above for 30 minutes daily and/or when sitting, a frequency of: - throughout the  day Avoid standing for long periods of time. Exercise regularly Compression stocking or Garment 20-30 mm/Hg pressure to: 06/11/2022: Her wound is healed. She did bring her compression stockings from elastic therapy with her today. One of them was applied to her left lower extremity. She said that it felt comfortable. She was reminded to wear compression stockings throughout the day and she may remove them at night. She will follow-up with our clinic on an as-needed basis. Electronic Signature(s) Signed: 06/11/2022 3:17:24 PM By: [name] MD FACS Entered By: [name] on 06/11/2022 15:17:24 -------------------------------------------------------------------------------- HxROS Details Patient Name: Date of Service: BLA CKA RD, CA RO L F. 06/11/2022 3:00 PM Medical Record Number: [id number] Patient Account Number: [id number] Date of Birth/Sex: Treating RN: [birth year] (66 y.o. Fredderick Phenix Primary Care Provider: [name] Other Clinician: Referring Provider: Treating Provider/Extender: Sharon Mt in Treatment: 3 Information Obtained From Patient Chart Constitutional Symptoms (General Health) Medical History: Past Medical History Notes: obesity Eyes Medical History: Negative  for: Cataracts; Glaucoma; Optic Neuritis Cardiovascular Medical History: Positive for: Hypertension; Peripheral Arterial Disease; Peripheral Venous Disease Past Medical History Notes: pulmonary hypertension Endocrine Medical History: Positive for: Type II Diabetes Past Medical History Notes: hypothyroidism Time with diabetes: 10 yrs Treated with: Oral agents Blood sugar tested every day: Yes Tested : once Genitourinary Medical History: Negative for: End Stage Renal Disease Past Medical History Notes: CKD stage 3 Integumentary (Skin) Medical History: Negative for: History of Burn Musculoskeletal Medical History: Positive for: Gout - hx; Osteomyelitis - hx right foot Past Medical History Notes: fibromyalgia Neurologic Medical History: Positive for: Neuropathy Oncologic Medical History: Negative for: Received Chemotherapy; Received Radiation Psychiatric Medical History: Negative for: Anorexia/bulimia; Confinement Anxiety Immunizations Pneumococcal Vaccine: Received Pneumococcal Vaccination: Yes Received Pneumococcal Vaccination On or After 60th Birthday: Yes Implantable Devices None Hospitalization / Surgery History Type of Hospitalization/Surgery right BKA vaginal hysterectomy tubal ligation TMJ arthroscopy tonsillectomy umbilical hernia repair Family and Social History Cancer: Yes - Mother; Diabetes: No; Heart Disease: Yes - Father; Hereditary Spherocytosis: No; Hypertension: Yes - Father; Kidney Disease: No; Lung Disease: No; Seizures: No; Stroke: Yes - Maternal Grandparents; Thyroid Problems: No; Tuberculosis: No; Current every day smoker - 1 ppd; Marital Status - Married; Alcohol Use: Never; Drug Use: No History; Caffeine Use: Daily - tea; Financial Concerns: No; Food, Clothing or Shelter Needs: No; Support System Lacking: No; Transportation Concerns: No Electronic Signature(s) Signed: 06/11/2022 4:01:31 PM By: [name] MD FACS Signed: 06/11/2022  4:47:37 PM By: Gelene Mink By: [name] on 06/11/2022 15:15:40 -------------------------------------------------------------------------------- SuperBill Details Patient Name: Date of Service: BLA CKA RD, CA RO L F. 06/11/2022 Medical Record Number: [id number] Patient Account Number: [id number] Date of Birth/Sex: Treating RN: [birth year] (66 y.o. 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 site E11.622 Type 2 diabetes mellitus with other skin ulcer I87.2 Venous insufficiency (chronic) (peripheral) I10 Essential (primary) hypertension I27.20 Pulmonary hypertension, unspecified Z89.511 Acquired absence of right leg below knee Facility Procedures CPT4 Code: [id number] Description: 56387 - WOUND CARE VISIT-LEV 2 EST PT Modifier: Quantity: 1 Physician Procedures : CPT4 Code Description Modifier [id number] - WC PHYS LEVEL 3 - EST PT ICD-10 Diagnosis Description L97.829 Non-pressure chronic ulcer of other part of left lower leg  with unspecified severity I83.029 Varicose veins of left lower extremity with ulcer  of unspecified site E11.622 Type 2 diabetes mellitus with other skin ulcer I87.2 Venous insufficiency (chronic) (peripheral) Quantity: 1 Electronic Signature(s) Signed: 06/11/2022 4:01:31 PM By: [name] MD FACS Signed: 06/11/2022 4:47:37 PM By: [name] Previous Signature: 06/11/2022 3:26:12 PM Version By: [name] MD FACS Entered By: [name] on 06/11/2022 15:45:53

## 2022-06-11 NOTE — Progress Notes (Signed)
TOULA, MIYASAKI ([id number]) Visit Report for 06/11/2022 Arrival Information Details Patient Name: Date of Service: BLA CKA RD, [place] RO L F. 06/11/2022 3:00 PM Medical Record Number: [id number] Patient Account Number: [id number] Date of Birth/Sex: Treating RN: 1955-12-04 (66 y.o. Fredderick Phenix Primary Care Stonewall Doss: [name] Other Clinician: Referring Paiten Boies: Treating Atlas Crossland/Extender: Sharon Mt in Treatment: 3 Visit Information History Since Last Visit Added or deleted any medications: No Patient Arrived: Gilmer Mor Any new allergies or adverse reactions: No Arrival Time: 14:55 Had a fall or experienced change in No Accompanied By: husband activities of daily living that may affect Transfer Assistance: None risk of falls: Patient Identification Verified: Yes Signs or symptoms of abuse/neglect since last visito No Secondary Verification Process Completed: Yes Hospitalized since last visit: No Patient Requires Transmission-Based Precautions: No Implantable device outside of the clinic excluding No Patient Has Alerts: No cellular tissue based products placed in the center since last visit: Has Dressing in Place as Prescribed: Yes Has Compression in Place as Prescribed: Yes Pain Present Now: Yes Electronic Signature(s) Signed: 06/11/2022 4:47:37 PM By: [name] Entered By: [name] on 06/11/2022 14:58:07 -------------------------------------------------------------------------------- Clinic Level of Care Assessment Details Patient Name: Date of Service: BLA CKA RD, [place] RO L F. 06/11/2022 3:00 PM Medical Record Number: [id number] Patient Account Number: [id number] Date of Birth/Sex: Treating RN: 06/02/1956 (66 y.o. Fredderick Phenix Primary Care Leiam Hopwood: [name] Other Clinician: Referring Akshara Blumenthal: Treating Marquest Gunkel/Extender: Sharon Mt in Treatment: 3 Clinic Level of Care  Assessment Items TOOL 4 Quantity Score X- 1 0 Use when only an EandM is performed on FOLLOW-UP visit ASSESSMENTS - Nursing Assessment / Reassessment X- 1 10 Reassessment of Co-morbidities (includes updates in patient status) X- 1 5 Reassessment of Adherence to Treatment Plan ASSESSMENTS - Wound and Skin A ssessment / Reassessment X - Simple Wound Assessment / Reassessment - one wound 1 5 []  - 0 Complex Wound Assessment / Reassessment - multiple wounds []  - 0 Dermatologic / Skin Assessment (not related to wound area) ASSESSMENTS - Focused Assessment X- 1 5 Circumferential Edema Measurements - multi extremities []  - 0 Nutritional Assessment / Counseling / Intervention X- 1 5 Lower Extremity Assessment (monofilament, tuning fork, pulses) []  - 0 Peripheral Arterial Disease Assessment (using hand held doppler) ASSESSMENTS - Ostomy and/or Continence Assessment and Care []  - 0 Incontinence Assessment and Management []  - 0 Ostomy Care Assessment and Management (repouching, etc.) PROCESS - Coordination of Care X - Simple Patient / Family Education for ongoing care 1 15 []  - 0 Complex (extensive) Patient / Family Education for ongoing care X- 1 10 Staff obtains , Records, T Results / Process Orders est []  - 0 Staff telephones HHA, Nursing Homes / Clarify orders / etc []  - 0 Routine Transfer to another Facility (non-emergent condition) []  - 0 Routine Hospital Admission (non-emergent condition) []  - 0 New Admissions / / Ordering NPWT Apligraf, etc. , []  - 0 Emergency Hospital Admission (emergent condition) []  - 0 Simple Discharge Coordination []  - 0 Complex (extensive) Discharge Coordination PROCESS - Special Needs []  - 0 Pediatric / Minor Patient Management []  - 0 Isolation Patient Management []  - 0 Hearing / Language / Visual special needs []  - 0 Assessment of Community assistance (transportation, D/C planning, etc.) []  -  0 Additional assistance / Altered mentation []  - 0 Support Surface(s) Assessment (bed, cushion, seat, etc.) INTERVENTIONS - Wound Cleansing / Measurement X - Simple Wound Cleansing - one wound  1 5 []  - 0 Complex Wound Cleansing - multiple wounds X- 1 5 Wound Imaging (photographs - any number of wounds) []  - 0 Wound Tracing (instead of photographs) X- 1 5 Simple Wound Measurement - one wound []  - 0 Complex Wound Measurement - multiple wounds INTERVENTIONS - Wound Dressings []  - 0 Small Wound Dressing one or multiple wounds []  - 0 Medium Wound Dressing one or multiple wounds []  - 0 Large Wound Dressing one or multiple wounds []  - 0 Application of Medications - topical []  - 0 Application of Medications - injection INTERVENTIONS - Miscellaneous []  - 0 External ear exam []  - 0 Specimen Collection (cultures, biopsies, blood, body fluids, etc.) []  - 0 Specimen(s) / Culture(s) sent or taken to Lab for analysis []  - 0 Patient Transfer (multiple staff / Lift / Similar devices) []  - 0 Simple Staple / Suture removal (25 or less) []  - 0 Complex Staple / Suture removal (26 or more) []  - 0 Hypo / Hyperglycemic Management (close monitor of Blood Glucose) []  - 0 Ankle / Brachial Index (ABI) - do not check if billed separately X- 1 5 Vital Signs Has the patient been seen at the hospital within the last three years: Yes Total Score: 75 Level Of Care: New/Established - Level 2 Electronic Signature(s) Signed: 06/11/2022 4:47:37 PM By: Entered By: on 06/11/2022 15:45:47 -------------------------------------------------------------------------------- Encounter Discharge Information Details Patient Name: Date of Service: BLA CKA RD, [place] RO L F. 06/11/2022 3:00 PM Medical Record Number: Patient Account Number: Date of Birth/Sex: Treating RN: December 15, 1955 (66 y.o. Primary Care Aleeya Veitch:  Other Clinician: Referring Avrom Robarts: Treating Bertine Schlottman/Extender: Michiel Sites in Treatment: 3 Encounter Discharge Information Items Discharge Condition: Stable Ambulatory Status: Cane Discharge Destination: Home Transportation: Private Auto Accompanied By: husband Schedule Follow-up Appointment: Yes Clinical Summary of Care: Patient Declined Electronic Signature(s) Signed: 06/11/2022 4:47:37 PM By: Entered By: on 06/11/2022 15:46:19 -------------------------------------------------------------------------------- Lower Extremity Assessment Details Patient Name: Date of Service: BLA CKA RD, [place] RO L F. 06/11/2022 3:00 PM Medical Record Number: [name] Patient Account Number: [name] Date of Birth/Sex: Treating RN: 09-20-1956 (66 y.o. [id number] Primary Care Samiyyah Moffa: [id number] Other Clinician: Referring Keilana Morlock: Treating Kenzington Mielke/Extender: 05/16/1956 in Treatment: 3 Edema Assessment Assessed: [Left: No] [Right: No] Edema: [Left: N] [Right: o] Calf Left: Right: Point of Measurement: 31 cm From Medial Instep 37.1 cm Ankle Left: Right: Point of Measurement: 12 cm From Medial Instep 21.2 cm Vascular Assessment Pulses: Dorsalis Pedis Palpable: [Left:Yes] Electronic Signature(s) Signed: 06/11/2022 4:47:37 PM By: Fredderick Phenix Entered By: [name] on 06/11/2022 15:00:29 -------------------------------------------------------------------------------- Multi Wound Chart Details Patient Name: Date of Service: BLA CKA RD, [place] RO L F. 06/11/2022 3:00 PM Medical Record Number: [name] Patient Account Number: [name] Date of Birth/Sex: Treating RN: 1956/08/09 (66 y.o. [id number] Primary Care Bellarae Lizer: [id number] Other Clinician: Referring Rehana Uncapher: Treating Raman Featherston/Extender: 05/16/1956 in Treatment:  3 Vital Signs Height(in): 63 Capillary Blood Glucose(mg/dl): 77 Weight(lbs): Fredderick Phenix Pulse(bpm): 81 Body Mass Index(BMI): 41.3 Blood Pressure(mmHg): 111/65 Temperature(F): 98.2 Respiratory Rate(breaths/min): 20 Photos: [N/A:N/A] Left, Proximal, Anterior Lower Leg N/A N/A Wound Location: Gradually Appeared N/A N/A Wounding Event: Venous Leg Ulcer N/A N/A Primary Etiology: Hypertension, Peripheral Arterial N/A N/A Comorbid History: Disease, Peripheral Venous Disease, Type II Diabetes, Gout, Osteomyelitis, Neuropathy 04/08/2022 N/A N/A Date Acquired: 3 N/A N/A Weeks of Treatment: Open N/A N/A Wound  Status: No N/A N/A Wound Recurrence: 0x0x0 N/A N/A Measurements L x W x D (cm) 0 N/A N/A A (cm) : rea 0 N/A N/A Volume (cm) : 100.00% N/A N/A % Reduction in Area: 100.00% N/A N/A % Reduction in Volume: Full Thickness Without Exposed N/A N/A Classification: Support Structures None Present N/A N/A Exudate Amount: Distinct, outline attached N/A N/A Wound Margin: None Present (0%) N/A N/A Granulation Amount: None Present (0%) N/A N/A Necrotic Amount: Fascia: No N/A N/A Exposed Structures: Fat Layer (Subcutaneous Tissue): No Tendon: No Muscle: No Joint: No Bone: No Large (67-100%) N/A N/A Epithelialization: Treatment Notes Electronic Signature(s) Signed: 06/11/2022 3:14:17 PM By: [name] MD FACS Signed: 06/11/2022 4:47:37 PM By: [name] Entered By: [name] on 06/11/2022 15:14:16 -------------------------------------------------------------------------------- Multi-Disciplinary Care Plan Details Patient Name: Date of Service: BLA CKA RD, [place] RO L F. 06/11/2022 3:00 PM Medical Record Number: [id number] Patient Account Number: [id number] Date of Birth/Sex: Treating RN: [birth year] (66 y.o. Fredderick Phenix Primary Care Camauri Fleece: [name] Other Clinician: Referring Yusuke Beza: Treating Micalah Cabezas/Extender: Sharon Mt in Treatment: 3 Multidisciplinary Care Plan reviewed with physician Active Inactive Nutrition Nursing Diagnoses: Impaired glucose control: actual or potential Potential for alteratiion in Nutrition/Potential for imbalanced nutrition Goals: Patient/caregiver will maintain therapeutic glucose control Date Initiated: 05/20/2022 Target Resolution Date: 06/17/2022 Goal Status: Active Interventions: Assess HgA1c results as ordered upon admission and as needed Provide education on elevated blood sugars and impact on wound healing Treatment Activities: Giving encouragement to exercise : 05/20/2022 Patient referred to Primary Care Physician for further nutritional evaluation : 05/20/2022 Notes: Wound/Skin Impairment Nursing Diagnoses: Impaired tissue integrity Knowledge deficit related to smoking impact on wound healing Knowledge deficit related to ulceration/compromised skin integrity Goals: Patient will demonstrate a reduced rate of smoking or cessation of smoking Date Initiated: 05/20/2022 Target Resolution Date: 06/17/2022 Goal Status: Active Patient/caregiver will verbalize understanding of skin care regimen Date Initiated: 05/20/2022 Target Resolution Date: 06/17/2022 Goal Status: Active Ulcer/skin breakdown will have a volume reduction of 30% by week 4 Date Initiated: 05/20/2022 Target Resolution Date: 06/17/2022 Goal Status: Active Interventions: Assess patient/caregiver ability to obtain necessary supplies Assess patient/caregiver ability to perform ulcer/skin care regimen upon admission and as needed Assess ulceration(s) every visit Provide education on ulcer and skin care Treatment Activities: Skin care regimen initiated : 05/20/2022 Topical wound management initiated : 05/20/2022 Notes: Electronic Signature(s) Signed: 06/11/2022 4:47:37 PM By: [name] Entered By: [name] on 06/11/2022  15:01:06 -------------------------------------------------------------------------------- Pain Assessment Details Patient Name: Date of Service: BLA CKA RD, [place] RO L F. 06/11/2022 3:00 PM Medical Record Number: [id number] Patient Account Number: [id number] Date of Birth/Sex: Treating RN: [birth year] (66 y.o. Fredderick Phenix Primary Care Sukhmani Fetherolf: [name] Other Clinician: Referring Tamario Heal: Treating Yolande Skoda/Extender: Sharon Mt in Treatment: 3 Active Problems Location of Pain Severity and Description of Pain Patient Has Paino Yes Site Locations Duration of the Pain. Constant / Intermittento Constant Rate the pain. Current Pain Level: 8 Character of Pain Describe the Pain: Other: neuropathy Pain Management and Medication Current Pain Management: Electronic Signature(s) Signed: 06/11/2022 4:47:37 PM By: [name] Entered By: [name] on 06/11/2022 14:58:42 -------------------------------------------------------------------------------- Patient/Caregiver Education Details Patient Name: Date of Service: BLA CKA RD, [place] RO Arrie Aran 7/28/2023andnbsp3:00 PM Medical Record Number: [id number] Patient Account Number: [id number] Date of Birth/Gender: Treating RN: 08-03-1956 (66 y.o. Fredderick Phenix Primary Care Physician: [name] Other Clinician: Referring Physician: Treating Physician/Extender: Sharon Mt in Treatment: 3 Education  Assessment Education Provided To: Patient Education Topics Provided Wound/Skin Impairment: Methods: Explain/Verbal Responses: Reinforcements needed, State content correctly Electronic Signature(s) Signed: 06/11/2022 4:47:37 PM By: [name] Entered By: [name] on 06/11/2022 15:01:19 -------------------------------------------------------------------------------- Wound Assessment Details Patient Name: Date of Service: BLA CKA RD,  [place] RO L F. 06/11/2022 3:00 PM Medical Record Number: [id number] Patient Account Number: [id number] Date of Birth/Sex: Treating RN: 10/21/1956 (66 y.o. Fredderick Phenix Primary Care Kamalei Roeder: [name] Other Clinician: Referring Claiborne Stroble: Treating Shahan Starks/Extender: Sharon Mt in Treatment: 3 Wound Status Wound Number: 1 Primary Venous Leg Ulcer Etiology: Wound Location: Left, Proximal, Anterior Lower Leg Wound Open Wounding Event: Gradually Appeared Status: Date Acquired: 04/08/2022 Comorbid Hypertension, Peripheral Arterial Disease, Peripheral Venous Weeks Of Treatment: 3 History: Disease, Type II Diabetes, Gout, Osteomyelitis, Neuropathy Clustered Wound: No Photos Wound Measurements Length: (cm) Width: (cm) Depth: (cm) Area: (cm) Volume: (cm) 0 % Reduction in Area: 100% 0 % Reduction in Volume: 100% 0 Epithelialization: Large (67-100%) 0 Tunneling: No 0 Undermining: No Wound Description Classification: Full Thickness Without Exposed Support Structures Wound Margin: Distinct, outline attached Exudate Amount: None Present Foul Odor After Cleansing: No Slough/Fibrino No Wound Bed Granulation Amount: None Present (0%) Exposed Structure Necrotic Amount: None Present (0%) Fascia Exposed: No Fat Layer (Subcutaneous Tissue) Exposed: No Tendon Exposed: No Muscle Exposed: No Joint Exposed: No Bone Exposed: No Electronic Signature(s) Signed: 06/11/2022 4:47:37 PM By: [name] Signed: 06/11/2022 5:18:09 PM By: Shawn Stall RN, BSN Entered By: Shawn Stall on 06/11/2022 15:01:57 -------------------------------------------------------------------------------- Vitals Details Patient Name: Date of Service: BLA CKA RD, [place] RO L F. 06/11/2022 3:00 PM Medical Record Number: [id number] Patient Account Number: [id number] Date of Birth/Sex: Treating RN: 12/31/1955 (66 y.o. Fredderick Phenix Primary Care Keirstyn Aydt: [name] Other Clinician: Referring Yalexa Blust: Treating Bernerd Terhune/Extender: Sharon Mt in Treatment: 3 Vital Signs Time Taken: 02:58 Temperature (F): 98.2 Height (in): 63 Pulse (bpm): 81 Weight (lbs): 233 Respiratory Rate (breaths/min): 20 Body Mass Index (BMI): 41.3 Blood Pressure (mmHg): 111/65 Capillary Blood Glucose (mg/dl): 782 Reference Range: 80 - 120 mg / dl Electronic Signature(s) Signed: 06/11/2022 4:47:37 PM By: [name] Entered By: [name] on 06/11/2022 14:58:30

## 2022-08-02 ENCOUNTER — Encounter (HOSPITAL_BASED_OUTPATIENT_CLINIC_OR_DEPARTMENT_OTHER): Payer: Medicare Other | Admitting: General Surgery

## 2022-08-06 NOTE — Progress Notes (Signed)
Erin [name], Erin [name] ([id number]) Visit Report for 05/20/2022 Allergy List Details Patient Name: Date of Service: BLA CKA RD, CA RO L F. 05/20/2022 9:00 A M Medical Record Number: [id number] Patient Account Number: [id number] Date of Birth/Sex: Treating RN: Mar 15, 1956 (65 y.o. Female) Baruch Gouty Primary Care Sheilia Reznick: [name] Other Clinician: Referring Kinta Martis: Treating Choya Tornow/Extender: [name] in Treatment: 0 Allergies Active Allergies No Known Allergies Allergy Notes Electronic Signature(s) Signed: 05/20/2022 5:06:25 PM By: Baruch Gouty RN, BSN Entered By: Baruch Gouty on 05/20/2022 09:10:36 -------------------------------------------------------------------------------- Arrival Information Details Patient Name: Date of Service: BLA CKA RD, CA RO L F. 05/20/2022 9:00 A M Medical Record Number: [id number] Patient Account Number: [id number] Date of Birth/Sex: Treating RN: March 14, 1956 (65 y.o. Female) Baruch Gouty Primary Care Abeer Iversen: [name] Other Clinician: Referring Romell Cavanah: Treating Eligah Anello/Extender: [name] in Treatment: 0 Visit Information Patient Arrived: [name] Arrival Time: 09:04 Accompanied By: spouse Transfer Assistance: None Patient Identification Verified: Yes Secondary Verification Process Completed: Yes Patient Requires Transmission-Based Precautions: No Patient Has Alerts: No Electronic Signature(s) Signed: 05/20/2022 5:06:25 PM By: Baruch Gouty RN, BSN Entered By: Baruch Gouty on 05/20/2022 09:08:03 -------------------------------------------------------------------------------- Clinic Level of Care Assessment Details Patient Name: Date of Service: BLA CKA RD, CA RO L F. 05/20/2022 9:00 A M Medical Record Number: [id number] Patient Account Number: [id number] Date of Birth/Sex: Treating RN: September 14, 1956 (66 y.o. Female) Baruch Gouty Primary Care Raymonde Hamblin: [name] Other Clinician: Referring Sybel Standish: Treating Emmalyne Giacomo/Extender: [name] in Treatment: 0 Clinic Level of Care Assessment Items TOOL 1 Quantity Score []  - 0 Use when EandM and Procedure is performed on INITIAL visit ASSESSMENTS - Nursing Assessment / Reassessment X- 1 20 General Physical Exam (combine w/ comprehensive assessment (listed just below) when performed on new pt. evals) X- 1 25 Comprehensive Assessment (HX, ROS, Risk Assessments, Wounds Hx, etc.) ASSESSMENTS - Wound and Skin Assessment / Reassessment []  - 0 Dermatologic / Skin Assessment (not related to wound area) ASSESSMENTS - Ostomy and/or Continence Assessment and Care []  - 0 Incontinence Assessment and Management []  - 0 Ostomy Care Assessment and Management (repouching, etc.) PROCESS - Coordination of Care X - Simple Patient / Family Education for ongoing care 1 15 []  - 0 Complex (extensive) Patient / Family Education for ongoing care X- 1 10 Staff obtains Programmer, systems, Records, T Results / Process Orders est []  - 0 Staff telephones HHA, Nursing Homes / Clarify orders / etc []  - 0 Routine Transfer to another Facility (non-emergent condition) []  - 0 Routine Hospital Admission (non-emergent condition) X- 1 15 New Admissions / Biomedical engineer / Ordering NPWT Apligraf, etc. , []  - 0 Emergency Hospital Admission (emergent condition) PROCESS - Special Needs []  - 0 Pediatric / Minor Patient Management []  - 0 Isolation Patient Management []  - 0 Hearing / Language / Visual special needs []  - 0 Assessment of Community assistance (transportation, D/C planning, etc.) []  - 0 Additional assistance / Altered mentation []  - 0 Support Surface(s) Assessment (bed, cushion, seat, etc.) INTERVENTIONS - Miscellaneous []  - 0 External ear exam []  - 0 Patient Transfer (multiple staff / Civil Service fast streamer / Similar devices) []  - 0 Simple Staple / Suture removal (25 or less) []  -  0 Complex Staple / Suture removal (26 or more) []  - 0 Hypo/Hyperglycemic Management (do not check if billed separately) X- 1 15 Ankle / Brachial Index (ABI) - do not check if billed separately Has the patient been seen at the hospital within the last  three years: Yes Total Score: 100 Level Of Care: New/Established - Level 3 Electronic Signature(s) Signed: 05/20/2022 5:06:25 PM By: Baruch Gouty RN, BSN Entered By: Baruch Gouty on 05/20/2022 10:13:21 -------------------------------------------------------------------------------- Encounter Discharge Information Details Patient Name: Date of Service: BLA CKA RD, CA RO L F. 05/20/2022 9:00 A M Medical Record Number: [id number] Patient Account Number: [id number] Date of Birth/Sex: Treating RN: 1956/10/13 (66 y.o. Female) Baruch Gouty Primary Care Kadance Mccuistion: [name] Other Clinician: Referring Arnel Wymer: Treating Tou Hayner/Extender: [name] in Treatment: 0 Encounter Discharge Information Items Post Procedure Vitals Discharge Condition: Stable Temperature (F): 98.3 Ambulatory Status: Cane Pulse (bpm): 92 Discharge Destination: Home Respiratory Rate (breaths/min): 18 Transportation: Private Auto Blood Pressure (mmHg): 118/70 Accompanied By: spouse Schedule Follow-up Appointment: Yes Clinical Summary of Care: Patient Declined Electronic Signature(s) Signed: 05/20/2022 5:06:25 PM By: Baruch Gouty RN, BSN Entered By: Baruch Gouty on 05/20/2022 10:14:49 -------------------------------------------------------------------------------- Lower Extremity Assessment Details Patient Name: Date of Service: BLA CKA RD, CA RO L F. 05/20/2022 9:00 A M Medical Record Number: [id number] Patient Account Number: [id number] Date of Birth/Sex: Treating RN: 12/03/1955 (66 y.o. Female) Sharyn Creamer Primary Care Mariaisabel Bodiford: [name] Other Clinician: Referring Averey Trompeter: Treating Shameca Landen/Extender:  [name] in Treatment: 0 Edema Assessment Assessed: [Left: Yes] [Right: No] E[Left: dema] [Right: :] Calf Left: Right: Point of Measurement: 31 cm From Medial Instep 21.2 cm Ankle Left: Right: Point of Measurement: 12 cm From Medial Instep 37.9 cm Knee To Floor Left: Right: From Medial Instep 39 cm Vascular Assessment Pulses: Dorsalis Pedis Palpable: [Left:Yes] Blood Pressure: Brachial: [Left:118] Dorsalis Pedis: 94 Ankle: [Left:Posterior Tibial: 100] Ankle Brachial Index: [Left:0.85] Electronic Signature(s) Signed: 05/20/2022 5:06:25 PM By: Baruch Gouty RN, BSN Signed: 08/06/2022 10:28:04 AM By: Sharyn Creamer RN, BSN Entered By: Baruch Gouty on 05/20/2022 09:32:58 -------------------------------------------------------------------------------- Multi Wound Chart Details Patient Name: Date of Service: BLA CKA RD, CA RO L F. 05/20/2022 9:00 A M Medical Record Number: [id number] Patient Account Number: [id number] Date of Birth/Sex: Treating RN: 1956/01/29 (66 y.o. Female) Baruch Gouty Primary Care Lowanda Cashaw: [name] Other Clinician: Referring Alphonzo Devera: Treating Audia Amick/Extender: [name] in Treatment: 0 Vital Signs Height(in): 65 Capillary Blood Glucose(mg/dl): 174 Weight(lbs): 233 Pulse(bpm): 64 Body Mass Index(BMI): 41.3 Blood Pressure(mmHg): 118/70 Temperature(F): 98.3 Respiratory Rate(breaths/min): 18 Photos: Left, Proximal, Anterior Lower Leg Left, Distal, Anterior Lower Leg Left, Posterior Lower Leg Wound Location: Gradually Appeared Gradually Appeared Gradually Appeared Wounding Event: T be determined o T be determined o T be determined o Primary Etiology: N/A N/A Hypertension, Peripheral Arterial Comorbid History: Disease, Peripheral Venous Disease, Type II Diabetes, Gout, Osteomyelitis, Neuropathy 04/08/2022 04/15/2022 04/15/2022 Date Acquired: 0 0 0 Weeks of  Treatment: Open Open Open Wound Status: No No No Wound Recurrence: 1.5x2.1x0.1 1.3x0.6x0.1 3.2x1.8x0.1 Measurements L x W x D (cm) 2.474 0.613 4.524 A (cm) : rea 0.247 0.061 0.452 Volume (cm) : 0.00% N/A N/A % Reduction in A rea: 0.00% N/A N/A % Reduction in Volume: Full Thickness Without Exposed Full Thickness Without Exposed Full Thickness Without Exposed Classification: Support Structures Support Structures Support Structures Medium Medium Medium Exudate A mount: Serosanguineous Serosanguineous Serosanguineous Exudate Type: red, brown red, brown red, brown Exudate Color: Distinct, outline attached Distinct, outline attached N/A Wound Margin: Large (67-100%) Large (67-100%) Large (67-100%) Granulation A mount: Red Red Red, Pink Granulation Quality: Small (1-33%) Small (1-33%) None Present (0%) Necrotic A mount: Fat Layer (Subcutaneous Tissue): Yes Fat Layer (Subcutaneous Tissue): Yes Fat Layer (Subcutaneous Tissue): Yes Exposed Structures: Fascia: No  Fascia: No Fascia: No Tendon: No Tendon: No Tendon: No Muscle: No Muscle: No Muscle: No Joint: No Joint: No Joint: No Bone: No Bone: No Bone: No None None None Epithelialization: Debridement - Excisional N/A N/A Debridement: Pre-procedure Verification/Time Out 09:45 N/A N/A Taken: Lidocaine 4% Topical Solution N/A N/A Pain Control: Subcutaneous, Slough N/A N/A Tissue Debrided: Skin/Subcutaneous Tissue N/A N/A Level: 3.15 N/A N/A Debridement A (sq cm): rea Curette N/A N/A Instrument: Minimum N/A N/A Bleeding: Pressure N/A N/A Hemostasis Achieved: 2 N/A N/A Procedural Pain: 1 N/A N/A Post Procedural Pain: Procedure was tolerated well N/A N/A Debridement Treatment Response: 1.5x2.1x0.1 N/A N/A Post Debridement Measurements L x W x D (cm) 0.247 N/A N/A Post Debridement Volume: (cm) Debridement N/A N/A Procedures Performed: Treatment Notes Electronic Signature(s) Signed: 05/20/2022  9:53:18 AM By: [name] MD FACS Signed: 05/20/2022 5:06:25 PM By: Baruch Gouty RN, BSN Entered By: [name] on 05/20/2022 09:53:18 -------------------------------------------------------------------------------- Multi-Disciplinary Care Plan Details Patient Name: Date of Service: BLA CKA RD, CA RO L F. 05/20/2022 9:00 A M Medical Record Number: [id number] Patient Account Number: [id number] Date of Birth/Sex: Treating RN: August 21, 1956 (66 y.o. Female) Baruch Gouty Primary Care Aimy Sweeting: [name] Other Clinician: Referring Alliyah Roesler: Treating Anagabriela Jokerst/Extender: [name] in Treatment: 0 Multidisciplinary Care Plan reviewed with physician Active Inactive Abuse / Safety / Falls / Self Care Management Nursing Diagnoses: History of Falls Goals: Patient/caregiver will verbalize/demonstrate measures taken to prevent injury and/or falls Date Initiated: 05/20/2022 Target Resolution Date: 06/17/2022 Goal Status: Active Interventions: Assess fall risk on admission and as needed Assess impairment of mobility on admission and as needed per policy Notes: Nutrition Nursing Diagnoses: Impaired glucose control: actual or potential Potential for alteratiion in Nutrition/Potential for imbalanced nutrition Goals: Patient/caregiver will maintain therapeutic glucose control Date Initiated: 05/20/2022 Target Resolution Date: 06/17/2022 Goal Status: Active Interventions: Assess HgA1c results as ordered upon admission and as needed Provide education on elevated blood sugars and impact on wound healing Treatment Activities: Giving encouragement to exercise : 05/20/2022 Patient referred to Primary Care Physician for further nutritional evaluation : 05/20/2022 Notes: Venous Leg Ulcer Nursing Diagnoses: Knowledge deficit related to disease process and management Potential for venous Insuffiency (use before diagnosis confirmed) Goals: Patient will maintain  optimal edema control Date Initiated: 05/20/2022 Target Resolution Date: 06/17/2022 Goal Status: Active Verify adequate tissue perfusion prior to therapeutic compression application Date Initiated: 05/20/2022 Target Resolution Date: 06/17/2022 Goal Status: Active Interventions: Assess peripheral edema status every visit. Compression as ordered Treatment Activities: Therapeutic compression applied : 05/20/2022 Notes: Wound/Skin Impairment Nursing Diagnoses: Impaired tissue integrity Knowledge deficit related to smoking impact on wound healing Knowledge deficit related to ulceration/compromised skin integrity Goals: Patient will demonstrate a reduced rate of smoking or cessation of smoking Date Initiated: 05/20/2022 Target Resolution Date: 06/17/2022 Goal Status: Active Patient/caregiver will verbalize understanding of skin care regimen Date Initiated: 05/20/2022 Target Resolution Date: 06/17/2022 Goal Status: Active Ulcer/skin breakdown will have a volume reduction of 30% by week 4 Date Initiated: 05/20/2022 Target Resolution Date: 06/17/2022 Goal Status: Active Interventions: Assess patient/caregiver ability to obtain necessary supplies Assess patient/caregiver ability to perform ulcer/skin care regimen upon admission and as needed Assess ulceration(s) every visit Provide education on ulcer and skin care Treatment Activities: Skin care regimen initiated : 05/20/2022 Topical wound management initiated : 05/20/2022 Notes: Electronic Signature(s) Signed: 05/20/2022 5:06:25 PM By: Baruch Gouty RN, BSN Entered By: Baruch Gouty on 05/20/2022 09:51:20 -------------------------------------------------------------------------------- Pain Assessment Details Patient Name: Date of Service: BLA CKA RD, CA RO  L F. 05/20/2022 9:00 A M Medical Record Number: [id number] Patient Account Number: [id number] Date of Birth/Sex: Treating RN: [birth year] (66 y.o. Female) Baruch Gouty Primary Care Neri Vieyra:  [name] Other Clinician: Referring Eesha Schmaltz: Treating Sibel Khurana/Extender: [name] in Treatment: 0 Active Problems Location of Pain Severity and Description of Pain Patient Has Paino No Site Locations Rate the pain. Current Pain Level: 0 Pain Management and Medication Current Pain Management: Electronic Signature(s) Signed: 05/20/2022 5:06:25 PM By: Baruch Gouty RN, BSN Entered By: Baruch Gouty on 05/20/2022 09:33:22 -------------------------------------------------------------------------------- Patient/Caregiver Education Details Patient Name: Date of Service: BLA CKA RD, CA RO L F. 7/6/2023andnbsp9:00 A M Medical Record Number: [id number] Patient Account Number: [id number] Date of Birth/Gender: Treating RN: [birth year] (66 y.o. Female) Baruch Gouty Primary Care Physician: [name] Other Clinician: Referring Physician: Treating Physician/Extender: [name] in Treatment: 0 Education Assessment Education Provided To: Patient Education Topics Provided Venous: Handouts: Controlling Swelling with Multilayered Compression Wraps Methods: Explain/Verbal, Printed Responses: Reinforcements needed, State content correctly [place]: o Handouts: Welcome T [facility] [place] o Methods: Explain/Verbal, Printed Responses: Reinforcements needed, State content correctly Wound/Skin Impairment: Handouts: Caring for Your Ulcer, Skin Care Do's and Dont's, Smoking and Wound Healing Methods: Explain/Verbal, Printed Responses: Reinforcements needed, State content correctly Electronic Signature(s) Signed: 05/20/2022 5:06:25 PM By: Baruch Gouty RN, BSN Entered By: Baruch Gouty on 05/20/2022 09:59:32 -------------------------------------------------------------------------------- Wound Assessment Details Patient Name: Date of Service: BLA CKA RD, CA RO L F. 05/20/2022 9:00 A  M Medical Record Number: [id number] Patient Account Number: [id number] Date of Birth/Sex: Treating RN: [birth year] (66 y.o. Female) Sharyn Creamer Primary Care Madysyn Hanken: [name] Other Clinician: Referring Lucian Baswell: Treating Christen Bedoya/Extender: [name] in Treatment: 0 Wound Status Wound Number: 1 Primary Etiology: T be determined o Wound Location: Left, Proximal, Anterior Lower Leg Wound Status: Open Wounding Event: Gradually Appeared Date Acquired: 04/08/2022 Weeks Of Treatment: 0 Clustered Wound: No Photos Wound Measurements Length: (cm) 1.5 Width: (cm) 2.1 Depth: (cm) 0.1 Area: (cm) 2.474 Volume: (cm) 0.247 % Reduction in Area: 0% % Reduction in Volume: 0% Epithelialization: None Tunneling: No Undermining: No Wound Description Classification: Full Thickness Without Exposed Support Structu Wound Margin: Distinct, outline attached Exudate Amount: Medium Exudate Type: Serosanguineous Exudate Color: red, brown res Foul Odor After Cleansing: No Slough/Fibrino Yes Wound Bed Granulation Amount: Large (67-100%) Exposed Structure Granulation Quality: Red Fascia Exposed: No Necrotic Amount: Small (1-33%) Fat Layer (Subcutaneous Tissue) Exposed: Yes Necrotic Quality: Adherent Slough Tendon Exposed: No Muscle Exposed: No Joint Exposed: No Bone Exposed: No Electronic Signature(s) Signed: 08/06/2022 10:28:04 AM By: Sharyn Creamer RN, BSN Entered By: Sharyn Creamer on 05/20/2022 09:19:14 -------------------------------------------------------------------------------- Wound Assessment Details Patient Name: Date of Service: BLA CKA RD, CA RO L F. 05/20/2022 9:00 A M Medical Record Number: [id number] Patient Account Number: [id number] Date of Birth/Sex: Treating RN: 06-12-1956 (66 y.o. Female) Sharyn Creamer Primary Care Maciah Schweigert: [name] Other Clinician: Referring Erisa Mehlman: Treating Annalysa Mohammad/Extender: [name] in Treatment: 0 Wound Status Wound Number: 2 Primary Etiology: T be determined o Wound Location: Left, Distal, Anterior Lower Leg Wound Status: Open Wounding Event: Gradually Appeared Date Acquired: 04/15/2022 Weeks Of Treatment: 0 Clustered Wound: No Photos Wound Measurements Length: (cm) 1.3 Width: (cm) 0.6 Depth: (cm) 0.1 Area: (cm) 0.613 Volume: (cm) 0.061 % Reduction in Area: % Reduction in Volume: Epithelialization: None Tunneling: No Undermining: No Wound Description Classification: Full Thickness Without Exposed Support Structu Wound Margin: Distinct, outline  attached Exudate Amount: Medium Exudate Type: Serosanguineous Exudate Color: red, brown res Foul Odor After Cleansing: No Slough/Fibrino Yes Wound Bed Granulation Amount: Large (67-100%) Exposed Structure Granulation Quality: Red Fascia Exposed: No Necrotic Amount: Small (1-33%) Fat Layer (Subcutaneous Tissue) Exposed: Yes Necrotic Quality: Adherent Slough Tendon Exposed: No Muscle Exposed: No Joint Exposed: No Bone Exposed: No Electronic Signature(s) Signed: 08/06/2022 10:28:04 AM By: Sharyn Creamer RN, BSN Entered By: Sharyn Creamer on 05/20/2022 09:20:52 -------------------------------------------------------------------------------- Wound Assessment Details Patient Name: Date of Service: BLA CKA RD, CA RO L F. 05/20/2022 9:00 A M Medical Record Number: [id number] Patient Account Number: [id number] Date of Birth/Sex: Treating RN: 1955/12/14 (66 y.o. Female) Sharyn Creamer Primary Care Marlyn Tondreau: [name] Other Clinician: Referring Kyonna Frier: Treating Welda Azzarello/Extender: [name] in Treatment: 0 Wound Status Wound Number: 3 Primary T be determined o Etiology: Wound Location: Left, Posterior Lower Leg Wound Open Wounding Event: Gradually Appeared Status: Date Acquired: 04/15/2022 Comorbid Hypertension, Peripheral Arterial  Disease, Peripheral Venous Weeks Of Treatment: 0 History: Disease, Type II Diabetes, Gout, Osteomyelitis, Neuropathy Clustered Wound: No Photos Wound Measurements Length: (cm) 3.2 Width: (cm) 1.8 Depth: (cm) 0.1 Area: (cm) 4.524 Volume: (cm) 0.452 % Reduction in Area: % Reduction in Volume: Epithelialization: None Tunneling: No Undermining: No Wound Description Classification: Full Thickness Without Exposed Support Structures Exudate Amount: Medium Exudate Type: Serosanguineous Exudate Color: red, brown Foul Odor After Cleansing: No Slough/Fibrino No Wound Bed Granulation Amount: Large (67-100%) Exposed Structure Granulation Quality: Red, Pink Fascia Exposed: No Necrotic Amount: None Present (0%) Fat Layer (Subcutaneous Tissue) Exposed: Yes Tendon Exposed: No Muscle Exposed: No Joint Exposed: No Bone Exposed: No Electronic Signature(s) Signed: 08/06/2022 10:28:04 AM By: Sharyn Creamer RN, BSN Entered By: Sharyn Creamer on 05/20/2022 [id number] -------------------------------------------------------------------------------- Vitals Details Patient Name: Date of Service: BLA CKA RD, CA RO L F. 05/20/2022 9:00 A M Medical Record Number: [id number] Patient Account Number: [id number] Date of Birth/Sex: Treating RN: Jul 01, 1956 (66 y.o. Female) Baruch Gouty Primary Care Karry Causer: [name] Other Clinician: Referring Askari Kinley: Treating Cilicia Borden/Extender: [name] in Treatment: 0 Vital Signs Time Taken: 09:08 Temperature (F): 98.3 Height (in): 63 Pulse (bpm): 92 Source: Stated Respiratory Rate (breaths/min): 18 Weight (lbs): 233 Blood Pressure (mmHg): 118/70 Source: Stated Capillary Blood Glucose (mg/dl): 174 Body Mass Index (BMI): 41.3 Reference Range: 80 - 120 mg / dl Notes glucose per pt report this am Electronic Signature(s) Signed: 05/20/2022 5:06:25 PM By: Baruch Gouty RN, BSN Entered By: Baruch Gouty on  05/20/2022 09:10:06

## 2023-04-27 ENCOUNTER — Encounter: Payer: Self-pay | Admitting: Family

## 2023-04-27 ENCOUNTER — Ambulatory Visit (INDEPENDENT_AMBULATORY_CARE_PROVIDER_SITE_OTHER): Payer: Medicare Other | Admitting: Family

## 2023-04-27 DIAGNOSIS — T148XXA Other injury of unspecified body region, initial encounter: Secondary | ICD-10-CM

## 2023-04-27 DIAGNOSIS — L03115 Cellulitis of right lower limb: Secondary | ICD-10-CM

## 2023-04-27 MED ORDER — DOXYCYCLINE HYCLATE 100 MG PO TABS: 100.0000 mg | ORAL_TABLET | Freq: Two times a day (BID) | ORAL | 0 refills | Status: DC

## 2023-04-27 NOTE — Progress Notes (Signed)
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  She denies fevers however she has had some chills has had good p.o. intake today Assessment & Plan: Visit Diagnoses: No diagnosis found.  Plan: Will place on a course of doxycycline.  She will change the dressings daily use compression.  Hold off on wearing her prosthesis.  Discussed return precautions.  Follow-Up Instructions: Return in about 5 days (around 05/02/2023).   Ortho Exam  Patient is alert, oriented, no adenopathy, well-dressed, normal affect, normal respiratory effort. On examination of the right residual limb her amputation site is well-healed however she does have cellulitis to the proximal small thigh and residual limb with some dermatitis.  There is palpable fluctuance to the anterior shin  Imaging: No results found.   Labs: Lab Results  Component Value Date   HGBA1C 5.8 (H) 07/15/2017   HGBA1C 6.1 (H) 12/28/2016   HGBA1C 6.0 (H) 07/04/2015   ESRSEDRATE 90 (H) 07/05/2015   ESRSEDRATE 45 (H) 12/10/2014   LABURIC 8.4 (H) 02/16/2017   LABURIC 6.5 11/23/2016   LABURIC 8.3 (H) 11/02/2016     Lab Results  Component Value Date   ALBUMIN 3.8 03/09/2017   ALBUMIN 4.0 12/25/2015   ALBUMIN 3.7 07/05/2015    No results found for: "MG" No results found for: "VD25OH"  No results found for: "PREALBUMIN"    Latest Ref Rng & Units  04/03/2020   11:47 AM 07/15/2017   10:15 AM 03/09/2017    4:45 PM  CBC EXTENDED  WBC 4.0 - 10.5 K/uL 7.6  13.9  12.9   RBC 3.87 - 5.11 MIL/uL 3.95  2.79  2.85   Hemoglobin 12.0 - 15.0 g/dL 19.1  8.1  8.2 Repeated and verified X2.   HCT 36.0 - 46.0 % 37.7  26.1  25.0 Repeated and verified X2.   Platelets 150 - 400 K/uL 165  371  398.0      There is no height or weight on file to calculate BMI.  Orders:  No orders of the defined types were placed in this encounter.  No orders of the defined types were placed in this encounter.    Procedures: Incision & Drainage  Date/Time: 04/27/2023 2:31 PM  Performed by: Adonis Huguenin, NP Authorized by: Adonis Huguenin, NP   Consent:    Consent obtained:  Verbal   Consent given by:  Patient   Risks, benefits, and alternatives were discussed: yes   Location:    Type:  Abscess Anesthesia:    Anesthesia method:  Local infiltration   Local anesthetic:  Lidocaine 1% w/o epi Procedure details:    Drainage:  Bloody  Drainage amount: 40 cc.   Wound treatment:  Wound left open Post-procedure details:    Procedure completion:  Tolerated well, no immediate complications   Clinical Data: No additional findings.  ROS:  All other systems negative, except as noted in the HPI. Review of Systems  Objective: Vital Signs: There were no vitals taken for this visit.  Specialty Comments:  No specialty comments available.  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) 07/15/2017   Charcot foot due to diabetes mellitus (HCC) 07/13/2017   OSA (obstructive sleep apnea) 05/24/2017   Sleep related hypoxia 05/24/2017   Unilateral primary osteoarthritis, left knee 05/09/2017   Pulmonary hypertension, low resistance (HCC) 04/06/2017   At risk for  obstructive sleep apnea 04/06/2017   Idiopathic chronic venous hypertension of both lower extremities with inflammation 03/21/2017   Idiopathic chronic gout of multiple sites without tophus 01/12/2017   Influenza with respiratory manifestation 12/28/2016   Acute kidney injury (HCC) 12/28/2016   CKD (chronic kidney disease) stage 3, GFR 30-59 ml/min (HCC) 12/28/2016   Hypoxia 12/27/2016   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) 07/07/2015   Essential hypertension 07/06/2015   Fibromyalgia 07/06/2015   Dyspnea 07/04/2015   Diabetic neuropathy (HCC) 12/10/2014   Past Medical History:  Diagnosis Date   Arthritis    Osteoarthritis right calcaneous   Carpal tunnel syndrome    Charcot ankle, right    Chronic kidney disease    patient unaware- does not see a nephrologist   Chronic pain    Complication of anesthesia    Depression    Diabetes mellitus    Tyoe II   Diabetic neuropathy (HCC)    Fibromyalgia    GERD (gastroesophageal reflux disease)    Headache    Hernia    History of blood transfusion    after miscarriage   Hyperlipemia    Hypertension    Miscarriage    twins   Peripheral neuropathy    PONV (postoperative nausea and vomiting)    no problem with medication   TMJ syndrome     Family History  Problem Relation Age of Onset   Aneurysm Father        Deceased, 32   CAD Father    Heart disease Father    Cancer Father    Pancreatic cancer Mother        Deceased, 16    Past Surgical History:  Procedure Laterality Date   ABDOMINAL SURGERY     AMPUTATION Right 07/15/2017   Procedure: RIGHT BELOW KNEE AMPUTATION;  Surgeon: Nadara Mustard, MD;  Location: [facility] OR;  Service: Orthopedics;  Laterality: Right;   BUNIONECTOMY Bilateral    2 separate surgies   COLONOSCOPY     PARTIAL HYSTERECTOMY  1983   TMJ ARTHROPLASTY     left side x 2   TONSILLECTOMY     TUBAL LIGATION  1983   Umbilical hernia     x2   VAGINAL HYSTERECTOMY  2002   Social  History   Occupational History   Occupation: disabled  Tobacco Use   Smoking status: Every Day    Packs/day: 1.00    Years: 32.00    Additional pack years: 0.00    Total pack years: 32.00    Types: Cigarettes  Last attempt to quit: 11/15/2008    Years since quitting: 14.4   Smokeless tobacco: Never  Vaping Use   Vaping Use: Never used  Substance and Sexual Activity   Alcohol use: No    Alcohol/week: 0.0 standard drinks of alcohol   Drug use: No   Sexual activity: Not on file

## 2023-05-02 ENCOUNTER — Ambulatory Visit: Payer: Medicare Other | Admitting: Orthopedic Surgery

## 2023-05-02 DIAGNOSIS — M7989 Other specified soft tissue disorders: Secondary | ICD-10-CM

## 2023-05-02 DIAGNOSIS — L03115 Cellulitis of right lower limb: Secondary | ICD-10-CM

## 2023-05-03 ENCOUNTER — Encounter: Payer: Self-pay | Admitting: Orthopedic Surgery

## 2023-05-03 NOTE — Progress Notes (Signed)
Office Visit Note   Patient: Erin [name]           Date of Birth: 08/12/1956           MRN: [id number] Visit Date: 05/02/2023              Requested by: Rebekah Chesterfield, NP [address] [place] [address] [place],  [place] [address] PCP: Rebekah Chesterfield, NP  Chief Complaint  Patient presents with   Right Leg - Follow-up      HPI: Patient is a 67 year old woman who is seen for evaluation for swelling right below-knee amputation.  Patient recently has seen Denny Peon she was started on doxycycline.  Patient states she did have a hematoma aspirated from the residual limb.  Assessment & Plan: Visit Diagnoses:  1. Cellulitis of right lower limb     Plan: Repeat aspiration shows persistent hematoma.  Discussed that we can continue with conservative treatment with compression and follow-up in 1 week.  Discussed that if we do not have sufficient resolution of the hematoma surgical excisional debridement is an option.  Follow-Up Instructions: Return in about 1 week (around 05/09/2023).   Ortho Exam  Patient is alert, oriented, no adenopathy, well-dressed, normal affect, normal respiratory effort. Examination patient has a large fluid collection anteriorly over the distal tibia of the right BKA.  There is no cellulitis there is no tenderness to palpation there is fluctuation.  After informed consent and sterile prepping the skin was anesthetized with 1 cc of 1% lidocaine plain.  20 cc of a hematoma was aspirated.  There was no purulence.  Examination the left foot patient has a palpable dorsalis pedis pulse.  She does have slow capillary refill in the toes approximately 4 seconds.  She has some swelling and ischemic changes of the fourth toe.  There is no tenderness to palpation no cellulitis no signs of infection.  Imaging: No results found. No images are attached to the encounter.  Labs: Lab Results  Component Value Date   HGBA1C 5.8 (H) 07/15/2017   HGBA1C 6.1 (H) 12/28/2016   HGBA1C 6.0 (H)  07/04/2015   ESRSEDRATE 90 (H) 07/05/2015   ESRSEDRATE 45 (H) 12/10/2014   LABURIC 8.4 (H) 02/16/2017   LABURIC 6.5 11/23/2016   LABURIC 8.3 (H) 11/02/2016     Lab Results  Component Value Date   ALBUMIN 3.8 03/09/2017   ALBUMIN 4.0 12/25/2015   ALBUMIN 3.7 07/05/2015    No results found for: "MG" No results found for: "VD25OH"  No results found for: "PREALBUMIN"    Latest Ref Rng & Units 04/03/2020   11:47 AM 07/15/2017   10:15 AM 03/09/2017    4:45 PM  CBC EXTENDED  WBC 4.0 - 10.5 K/uL 7.6  13.9  12.9   RBC 3.87 - 5.11 MIL/uL 3.95  2.79  2.85   Hemoglobin 12.0 - 15.0 g/dL 78.2  8.1  8.2 Repeated and verified X2.   HCT 36.0 - 46.0 % 37.7  26.1  25.0 Repeated and verified X2.   Platelets 150 - 400 K/uL 165  371  398.0      There is no height or weight on file to calculate BMI.  Orders:  No orders of the defined types were placed in this encounter.  No orders of the defined types were placed in this encounter.    Procedures: No procedures performed  Clinical Data: No additional findings.  ROS:  All other systems negative, except as noted in the  HPI. Review of Systems  Objective: Vital Signs: There were no vitals taken for this visit.  Specialty Comments:  No specialty comments available.  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) 07/15/2017   Charcot foot due to diabetes mellitus (HCC) 07/13/2017   OSA (obstructive sleep apnea) 05/24/2017   Sleep related hypoxia 05/24/2017   Unilateral primary osteoarthritis, left knee 05/09/2017   Pulmonary hypertension, low resistance (HCC) 04/06/2017   At risk for obstructive sleep apnea 04/06/2017   Idiopathic chronic venous hypertension of both lower extremities with inflammation 03/21/2017   Idiopathic chronic  gout of multiple sites without tophus 01/12/2017   Influenza with respiratory manifestation 12/28/2016   Acute kidney injury (HCC) 12/28/2016   CKD (chronic kidney disease) stage 3, GFR 30-59 ml/min (HCC) 12/28/2016   Hypoxia 12/27/2016   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) 07/07/2015   Essential hypertension 07/06/2015   Fibromyalgia 07/06/2015   Dyspnea 07/04/2015   Diabetic neuropathy (HCC) 12/10/2014   Past Medical History:  Diagnosis Date   Arthritis    Osteoarthritis right calcaneous   Carpal tunnel syndrome    Charcot ankle, right    Chronic kidney disease    patient unaware- does not see a nephrologist   Chronic pain    Complication of anesthesia    Depression    Diabetes mellitus    Tyoe II   Diabetic neuropathy (HCC)    Fibromyalgia    GERD (gastroesophageal reflux disease)    Headache    Hernia    History of blood transfusion    after miscarriage   Hyperlipemia    Hypertension    Miscarriage    twins   Peripheral neuropathy    PONV (postoperative nausea and vomiting)    no problem with medication   TMJ syndrome     Family History  Problem Relation Age of Onset   Aneurysm Father        Deceased, 57   CAD Father    Heart disease Father    Cancer Father    Pancreatic cancer Mother        Deceased, 21    Past Surgical History:  Procedure Laterality Date   ABDOMINAL SURGERY     AMPUTATION Right 07/15/2017   Procedure: RIGHT BELOW KNEE AMPUTATION;  Surgeon: Nadara Mustard, MD;  Location: [facility] OR;  Service: Orthopedics;  Laterality: Right;   BUNIONECTOMY Bilateral    2 separate surgies   COLONOSCOPY     PARTIAL HYSTERECTOMY  1983   TMJ ARTHROPLASTY     left side x 2   TONSILLECTOMY     TUBAL LIGATION  1983   Umbilical hernia     x2   VAGINAL HYSTERECTOMY  2002   Social History   Occupational History   Occupation: disabled  Tobacco Use   Smoking status: Every Day    Packs/day: 1.00    Years: 32.00    Additional  pack years: 0.00    Total pack years: 32.00    Types: Cigarettes    Last attempt to quit: 11/15/2008    Years since quitting: 14.4   Smokeless tobacco: Never  Vaping Use   Vaping Use: Never used  Substance and Sexual Activity   Alcohol use: No    Alcohol/week: 0.0 standard drinks of  alcohol   Drug use: No   Sexual activity: Not on file

## 2023-05-10 ENCOUNTER — Encounter: Payer: Self-pay | Admitting: Orthopedic Surgery

## 2023-05-10 ENCOUNTER — Ambulatory Visit: Payer: Medicare Other | Admitting: Orthopedic Surgery

## 2023-05-10 DIAGNOSIS — T148XXA Other injury of unspecified body region, initial encounter: Secondary | ICD-10-CM | POA: Diagnosis not present

## 2023-05-10 NOTE — Progress Notes (Signed)
Office Visit Note   Patient: Erin [name]           Date of Birth: 27-Jan-1956           MRN: [id number] Visit Date: 05/10/2023              Requested by: Rebekah Chesterfield, NP [address] [place] [address] [place],  [place] [address] PCP: Rebekah Chesterfield, NP  Chief Complaint  Patient presents with   Right Leg - Wound Check      HPI: Patient is a 67 year old woman status post hematoma right transtibial amputation.  Assessment & Plan: Visit Diagnoses:  1. Hematoma     Plan: Will continue with compression and not use her prosthesis.  Follow-Up Instructions: Return in about 3 weeks (around 05/31/2023).   Ortho Exam  Patient is alert, oriented, no adenopathy, well-dressed, normal affect, normal respiratory effort. Examination the hematoma is resolving there is no skin color or temperature changes no ulcers no drainage the area of hematoma is nontender to palpation approximately 4 cm in diameter.  Imaging: No results found. No images are attached to the encounter.  Labs: Lab Results  Component Value Date   HGBA1C 5.8 (H) 07/15/2017   HGBA1C 6.1 (H) 12/28/2016   HGBA1C 6.0 (H) 07/04/2015   ESRSEDRATE 90 (H) 07/05/2015   ESRSEDRATE 45 (H) 12/10/2014   LABURIC 8.4 (H) 02/16/2017   LABURIC 6.5 11/23/2016   LABURIC 8.3 (H) 11/02/2016     Lab Results  Component Value Date   ALBUMIN 3.8 03/09/2017   ALBUMIN 4.0 12/25/2015   ALBUMIN 3.7 07/05/2015    No results found for: "MG" No results found for: "VD25OH"  No results found for: "PREALBUMIN"    Latest Ref Rng & Units 04/03/2020   11:47 AM 07/15/2017   10:15 AM 03/09/2017    4:45 PM  CBC EXTENDED  WBC 4.0 - 10.5 K/uL 7.6  13.9  12.9   RBC 3.87 - 5.11 MIL/uL 3.95  2.79  2.85   Hemoglobin 12.0 - 15.0 g/dL 19.1  8.1  8.2 Repeated and verified X2.   HCT 36.0 - 46.0 % 37.7  26.1  25.0 Repeated and verified X2.   Platelets 150 - 400 K/uL 165  371  398.0      There is no height or weight on file to calculate  BMI.  Orders:  No orders of the defined types were placed in this encounter.  No orders of the defined types were placed in this encounter.    Procedures: No procedures performed  Clinical Data: No additional findings.  ROS:  All other systems negative, except as noted in the HPI. Review of Systems  Objective: Vital Signs: There were no vitals taken for this visit.  Specialty Comments:  No specialty comments available.  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) 07/15/2017   Charcot foot due to diabetes mellitus (HCC) 07/13/2017   OSA (obstructive sleep apnea) 05/24/2017   Sleep related hypoxia 05/24/2017   Unilateral primary osteoarthritis, left knee 05/09/2017   Pulmonary hypertension, low resistance (HCC) 04/06/2017   At risk for obstructive sleep apnea 04/06/2017   Idiopathic chronic venous hypertension of both lower extremities with inflammation 03/21/2017   Idiopathic chronic gout of multiple sites without tophus 01/12/2017  Influenza with respiratory manifestation 12/28/2016   Acute kidney injury (HCC) 12/28/2016   CKD (chronic kidney disease) stage 3, GFR 30-59 ml/min (HCC) 12/28/2016   Hypoxia 12/27/2016   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) 07/07/2015   Essential hypertension 07/06/2015   Fibromyalgia 07/06/2015   Dyspnea 07/04/2015   Diabetic neuropathy (HCC) 12/10/2014   Past Medical History:  Diagnosis Date   Arthritis    Osteoarthritis right calcaneous   Carpal tunnel syndrome    Charcot ankle, right    Chronic kidney disease    patient unaware- does not see a nephrologist   Chronic pain    Complication of anesthesia    Depression    Diabetes mellitus    Tyoe II   Diabetic neuropathy (HCC)    Fibromyalgia     GERD (gastroesophageal reflux disease)    Headache    Hernia    History of blood transfusion    after miscarriage   Hyperlipemia    Hypertension    Miscarriage    twins   Peripheral neuropathy    PONV (postoperative nausea and vomiting)    no problem with medication   TMJ syndrome     Family History  Problem Relation Age of Onset   Aneurysm Father        Deceased, 22   CAD Father    Heart disease Father    Cancer Father    Pancreatic cancer Mother        Deceased, 22    Past Surgical History:  Procedure Laterality Date   ABDOMINAL SURGERY     AMPUTATION Right 07/15/2017   Procedure: RIGHT BELOW KNEE AMPUTATION;  Surgeon: Nadara Mustard, MD;  Location: [facility] OR;  Service: Orthopedics;  Laterality: Right;   BUNIONECTOMY Bilateral    2 separate surgies   COLONOSCOPY     PARTIAL HYSTERECTOMY  1983   TMJ ARTHROPLASTY     left side x 2   TONSILLECTOMY     TUBAL LIGATION  1983   Umbilical hernia     x2   VAGINAL HYSTERECTOMY  2002   Social History   Occupational History   Occupation: disabled  Tobacco Use   Smoking status: Every Day    Packs/day: 1.00    Years: 32.00    Additional pack years: 0.00    Total pack years: 32.00    Types: Cigarettes    Last attempt to quit: 11/15/2008    Years since quitting: 14.4   Smokeless tobacco: Never  Vaping Use   Vaping Use: Never used  Substance and Sexual Activity   Alcohol use: No    Alcohol/week: 0.0 standard drinks of alcohol   Drug use: No   Sexual activity: Not on file

## 2023-05-30 ENCOUNTER — Ambulatory Visit: Payer: Medicare Other | Admitting: Orthopedic Surgery

## 2023-05-30 DIAGNOSIS — T148XXA Other injury of unspecified body region, initial encounter: Secondary | ICD-10-CM

## 2023-05-30 DIAGNOSIS — Z89511 Acquired absence of right leg below knee: Secondary | ICD-10-CM

## 2023-05-30 DIAGNOSIS — L03115 Cellulitis of right lower limb: Secondary | ICD-10-CM | POA: Diagnosis not present

## 2023-05-31 ENCOUNTER — Encounter: Payer: Self-pay | Admitting: Orthopedic Surgery

## 2023-05-31 NOTE — Progress Notes (Signed)
Office Visit Note   Patient: Erin [name]           Date of Birth: 1955-12-17           MRN: [id number] Visit Date: 05/30/2023              Requested by: Rebekah Chesterfield, NP [address] [place] [address] [place],  [place] [address] PCP: Rebekah Chesterfield, NP  Chief Complaint  Patient presents with   Right Leg - Follow-up      HPI: Patient is a 67 year old woman who is seen in follow-up for hematoma right below-knee amputation.  Patient has been using compression and has not used her prosthesis.  Assessment & Plan: Visit Diagnoses:  1. Hematoma   2. Cellulitis of right lower limb   3. Hx of BKA, right (HCC)     Plan: Resume using the prosthesis as tolerated.  Follow-Up Instructions: No follow-ups on file.   Ortho Exam  Patient is alert, oriented, no adenopathy, well-dressed, normal affect, normal respiratory effort. Examination the hematoma seems completely resolved there is no redness no cellulitis no ulcers or drainage.   Imaging: No results found. No images are attached to the encounter.  Labs: Lab Results  Component Value Date   HGBA1C 5.8 (H) 07/15/2017   HGBA1C 6.1 (H) 12/28/2016   HGBA1C 6.0 (H) 07/04/2015   ESRSEDRATE 90 (H) 07/05/2015   ESRSEDRATE 45 (H) 12/10/2014   LABURIC 8.4 (H) 02/16/2017   LABURIC 6.5 11/23/2016   LABURIC 8.3 (H) 11/02/2016     Lab Results  Component Value Date   ALBUMIN 3.8 03/09/2017   ALBUMIN 4.0 12/25/2015   ALBUMIN 3.7 07/05/2015    No results found for: "MG" No results found for: "VD25OH"  No results found for: "PREALBUMIN"    Latest Ref Rng & Units 04/03/2020   11:47 AM 07/15/2017   10:15 AM 03/09/2017    4:45 PM  CBC EXTENDED  WBC 4.0 - 10.5 K/uL 7.6  13.9  12.9   RBC 3.87 - 5.11 MIL/uL 3.95  2.79  2.85   Hemoglobin 12.0 - 15.0 g/dL 78.4  8.1  8.2 Repeated and verified X2.   HCT 36.0 - 46.0 % 37.7  26.1  25.0 Repeated and verified X2.   Platelets 150 - 400 K/uL 165  371  398.0      There is no height or weight  on file to calculate BMI.  Orders:  No orders of the defined types were placed in this encounter.  No orders of the defined types were placed in this encounter.    Procedures: No procedures performed  Clinical Data: No additional findings.  ROS:  All other systems negative, except as noted in the HPI. Review of Systems  Objective: Vital Signs: There were no vitals taken for this visit.  Specialty Comments:  No specialty comments available.  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) 07/15/2017   Charcot foot due to diabetes mellitus (HCC) 07/13/2017   OSA (obstructive sleep apnea) 05/24/2017   Sleep related hypoxia 05/24/2017   Unilateral primary osteoarthritis, left knee 05/09/2017   Pulmonary hypertension, low resistance (HCC) 04/06/2017   At risk for obstructive sleep apnea 04/06/2017   Idiopathic chronic venous hypertension of both lower extremities with inflammation 03/21/2017  Idiopathic chronic gout of multiple sites without tophus 01/12/2017   Influenza with respiratory manifestation 12/28/2016   Acute kidney injury (HCC) 12/28/2016   CKD (chronic kidney disease) stage 3, GFR 30-59 ml/min (HCC) 12/28/2016   Hypoxia 12/27/2016   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) 07/07/2015   Essential hypertension 07/06/2015   Fibromyalgia 07/06/2015   Dyspnea 07/04/2015   Diabetic neuropathy (HCC) 12/10/2014   Past Medical History:  Diagnosis Date   Arthritis    Osteoarthritis right calcaneous   Carpal tunnel syndrome    Charcot ankle, right    Chronic kidney disease    patient unaware- does not see a nephrologist   Chronic pain    Complication of anesthesia    Depression    Diabetes mellitus    Tyoe II   Diabetic neuropathy (HCC)     Fibromyalgia    GERD (gastroesophageal reflux disease)    Headache    Hernia    History of blood transfusion    after miscarriage   Hyperlipemia    Hypertension    Miscarriage    twins   Peripheral neuropathy    PONV (postoperative nausea and vomiting)    no problem with medication   TMJ syndrome     Family History  Problem Relation Age of Onset   Aneurysm Father        Deceased, 57   CAD Father    Heart disease Father    Cancer Father    Pancreatic cancer Mother        Deceased, 30    Past Surgical History:  Procedure Laterality Date   ABDOMINAL SURGERY     AMPUTATION Right 07/15/2017   Procedure: RIGHT BELOW KNEE AMPUTATION;  Surgeon: Nadara Mustard, MD;  Location: [facility] OR;  Service: Orthopedics;  Laterality: Right;   BUNIONECTOMY Bilateral    2 separate surgies   COLONOSCOPY     PARTIAL HYSTERECTOMY  1983   TMJ ARTHROPLASTY     left side x 2   TONSILLECTOMY     TUBAL LIGATION  1983   Umbilical hernia     x2   VAGINAL HYSTERECTOMY  2002   Social History   Occupational History   Occupation: disabled  Tobacco Use   Smoking status: Every Day    Current packs/day: 0.00    Average packs/day: 1 pack/day for 32.0 years (32.0 ttl pk-yrs)    Types: Cigarettes    Start date: 11/15/1976    Last attempt to quit: 11/15/2008    Years since quitting: 14.5   Smokeless tobacco: Never  Vaping Use   Vaping status: Never Used  Substance and Sexual Activity   Alcohol use: No    Alcohol/week: 0.0 standard drinks of alcohol   Drug use: No   Sexual activity: Not on file

## 2023-06-02 ENCOUNTER — Ambulatory Visit: Payer: Medicare Other | Admitting: Orthopedic Surgery

## 2023-10-23 ENCOUNTER — Emergency Department (HOSPITAL_COMMUNITY): Payer: Medicare Other

## 2023-10-23 ENCOUNTER — Inpatient Hospital Stay (HOSPITAL_COMMUNITY)
Admission: EM | Admit: 2023-10-23 | Discharge: 2023-11-16 | DRG: 871 | Disposition: E | Payer: Medicare Other | Attending: Pulmonary Disease | Admitting: Pulmonary Disease

## 2023-10-23 ENCOUNTER — Inpatient Hospital Stay (HOSPITAL_COMMUNITY): Payer: Medicare Other

## 2023-10-23 ENCOUNTER — Encounter (HOSPITAL_COMMUNITY): Payer: Self-pay | Admitting: *Deleted

## 2023-10-23 ENCOUNTER — Other Ambulatory Visit: Payer: Self-pay

## 2023-10-23 DIAGNOSIS — I4891 Unspecified atrial fibrillation: Secondary | ICD-10-CM | POA: Diagnosis present

## 2023-10-23 DIAGNOSIS — R7989 Other specified abnormal findings of blood chemistry: Secondary | ICD-10-CM

## 2023-10-23 DIAGNOSIS — J9601 Acute respiratory failure with hypoxia: Secondary | ICD-10-CM | POA: Diagnosis present

## 2023-10-23 DIAGNOSIS — N1832 Chronic kidney disease, stage 3b: Secondary | ICD-10-CM | POA: Diagnosis present

## 2023-10-23 DIAGNOSIS — I272 Pulmonary hypertension, unspecified: Secondary | ICD-10-CM | POA: Diagnosis present

## 2023-10-23 DIAGNOSIS — E11610 Type 2 diabetes mellitus with diabetic neuropathic arthropathy: Secondary | ICD-10-CM | POA: Diagnosis present

## 2023-10-23 DIAGNOSIS — I129 Hypertensive chronic kidney disease with stage 1 through stage 4 chronic kidney disease, or unspecified chronic kidney disease: Secondary | ICD-10-CM | POA: Diagnosis present

## 2023-10-23 DIAGNOSIS — G9341 Metabolic encephalopathy: Secondary | ICD-10-CM | POA: Diagnosis present

## 2023-10-23 DIAGNOSIS — E861 Hypovolemia: Secondary | ICD-10-CM | POA: Diagnosis present

## 2023-10-23 DIAGNOSIS — E039 Hypothyroidism, unspecified: Secondary | ICD-10-CM | POA: Diagnosis present

## 2023-10-23 DIAGNOSIS — Z6841 Body Mass Index (BMI) 40.0 and over, adult: Secondary | ICD-10-CM

## 2023-10-23 DIAGNOSIS — E11622 Type 2 diabetes mellitus with other skin ulcer: Secondary | ICD-10-CM | POA: Diagnosis present

## 2023-10-23 DIAGNOSIS — R197 Diarrhea, unspecified: Secondary | ICD-10-CM

## 2023-10-23 DIAGNOSIS — R6521 Severe sepsis with septic shock: Secondary | ICD-10-CM | POA: Diagnosis present

## 2023-10-23 DIAGNOSIS — E875 Hyperkalemia: Secondary | ICD-10-CM | POA: Diagnosis present

## 2023-10-23 DIAGNOSIS — M797 Fibromyalgia: Secondary | ICD-10-CM | POA: Diagnosis present

## 2023-10-23 DIAGNOSIS — E8729 Other acidosis: Secondary | ICD-10-CM | POA: Diagnosis present

## 2023-10-23 DIAGNOSIS — Z515 Encounter for palliative care: Secondary | ICD-10-CM | POA: Diagnosis not present

## 2023-10-23 DIAGNOSIS — J9602 Acute respiratory failure with hypercapnia: Secondary | ICD-10-CM | POA: Diagnosis present

## 2023-10-23 DIAGNOSIS — E119 Type 2 diabetes mellitus without complications: Secondary | ICD-10-CM | POA: Diagnosis not present

## 2023-10-23 DIAGNOSIS — L97929 Non-pressure chronic ulcer of unspecified part of left lower leg with unspecified severity: Secondary | ICD-10-CM | POA: Diagnosis present

## 2023-10-23 DIAGNOSIS — E785 Hyperlipidemia, unspecified: Secondary | ICD-10-CM | POA: Diagnosis present

## 2023-10-23 DIAGNOSIS — Z89511 Acquired absence of right leg below knee: Secondary | ICD-10-CM

## 2023-10-23 DIAGNOSIS — Z8249 Family history of ischemic heart disease and other diseases of the circulatory system: Secondary | ICD-10-CM

## 2023-10-23 DIAGNOSIS — A419 Sepsis, unspecified organism: Secondary | ICD-10-CM | POA: Diagnosis not present

## 2023-10-23 DIAGNOSIS — Z7985 Long-term (current) use of injectable non-insulin antidiabetic drugs: Secondary | ICD-10-CM

## 2023-10-23 DIAGNOSIS — F32A Depression, unspecified: Secondary | ICD-10-CM | POA: Diagnosis present

## 2023-10-23 DIAGNOSIS — N179 Acute kidney failure, unspecified: Secondary | ICD-10-CM | POA: Diagnosis present

## 2023-10-23 DIAGNOSIS — R0989 Other specified symptoms and signs involving the circulatory and respiratory systems: Secondary | ICD-10-CM | POA: Diagnosis not present

## 2023-10-23 DIAGNOSIS — G9340 Encephalopathy, unspecified: Secondary | ICD-10-CM | POA: Diagnosis not present

## 2023-10-23 DIAGNOSIS — R680 Hypothermia, not associated with low environmental temperature: Secondary | ICD-10-CM | POA: Diagnosis present

## 2023-10-23 DIAGNOSIS — Z79899 Other long term (current) drug therapy: Secondary | ICD-10-CM

## 2023-10-23 DIAGNOSIS — E1142 Type 2 diabetes mellitus with diabetic polyneuropathy: Secondary | ICD-10-CM | POA: Diagnosis present

## 2023-10-23 DIAGNOSIS — E11649 Type 2 diabetes mellitus with hypoglycemia without coma: Secondary | ICD-10-CM | POA: Diagnosis present

## 2023-10-23 DIAGNOSIS — Z79890 Hormone replacement therapy (postmenopausal): Secondary | ICD-10-CM

## 2023-10-23 DIAGNOSIS — E872 Acidosis, unspecified: Secondary | ICD-10-CM

## 2023-10-23 DIAGNOSIS — E1122 Type 2 diabetes mellitus with diabetic chronic kidney disease: Secondary | ICD-10-CM | POA: Diagnosis present

## 2023-10-23 DIAGNOSIS — F17210 Nicotine dependence, cigarettes, uncomplicated: Secondary | ICD-10-CM | POA: Diagnosis present

## 2023-10-23 DIAGNOSIS — Z66 Do not resuscitate: Secondary | ICD-10-CM | POA: Diagnosis not present

## 2023-10-23 DIAGNOSIS — R5381 Other malaise: Secondary | ICD-10-CM | POA: Diagnosis present

## 2023-10-23 DIAGNOSIS — Z800 Family history of malignant neoplasm of digestive organs: Secondary | ICD-10-CM

## 2023-10-23 DIAGNOSIS — Z7984 Long term (current) use of oral hypoglycemic drugs: Secondary | ICD-10-CM

## 2023-10-23 DIAGNOSIS — Z90711 Acquired absence of uterus with remaining cervical stump: Secondary | ICD-10-CM

## 2023-10-23 DIAGNOSIS — K219 Gastro-esophageal reflux disease without esophagitis: Secondary | ICD-10-CM | POA: Diagnosis present

## 2023-10-23 DIAGNOSIS — R579 Shock, unspecified: Secondary | ICD-10-CM | POA: Diagnosis not present

## 2023-10-23 LAB — I-STAT CHEM 8, ED
BUN: 60 mg/dL — ABNORMAL HIGH (ref 8–23)
Calcium, Ion: 0.93 mmol/L — ABNORMAL LOW (ref 1.15–1.40)
Chloride: 99 mmol/L (ref 98–111)
Creatinine, Ser: 3.9 mg/dL — ABNORMAL HIGH (ref 0.44–1.00)
Glucose, Bld: 94 mg/dL (ref 70–99)
HCT: 53 % — ABNORMAL HIGH (ref 36.0–46.0)
Hemoglobin: 18 g/dL — ABNORMAL HIGH (ref 12.0–15.0)
Potassium: 6.1 mmol/L — ABNORMAL HIGH (ref 3.5–5.1)
Sodium: 132 mmol/L — ABNORMAL LOW (ref 135–145)
TCO2: 19 mmol/L — ABNORMAL LOW (ref 22–32)

## 2023-10-23 LAB — COMPREHENSIVE METABOLIC PANEL
ALT: 176 U/L — ABNORMAL HIGH (ref 0–44)
ALT: 285 U/L — ABNORMAL HIGH (ref 0–44)
AST: 248 U/L — ABNORMAL HIGH (ref 15–41)
AST: 661 U/L — ABNORMAL HIGH (ref 15–41)
Albumin: 3.2 g/dL — ABNORMAL LOW (ref 3.5–5.0)
Albumin: 2.1 g/dL — ABNORMAL LOW (ref 3.5–5.0)
Alkaline Phosphatase: 92 U/L (ref 38–126)
Alkaline Phosphatase: 69 U/L (ref 38–126)
Anion gap: 25 — ABNORMAL HIGH (ref 5–15)
Anion gap: 17 — ABNORMAL HIGH (ref 5–15)
BUN: 67 mg/dL — ABNORMAL HIGH (ref 8–23)
BUN: 64 mg/dL — ABNORMAL HIGH (ref 8–23)
CO2: 17 mmol/L — ABNORMAL LOW (ref 22–32)
CO2: 18 mmol/L — ABNORMAL LOW (ref 22–32)
Calcium: 8.6 mg/dL — ABNORMAL LOW (ref 8.9–10.3)
Calcium: 7.1 mg/dL — ABNORMAL LOW (ref 8.9–10.3)
Chloride: 94 mmol/L — ABNORMAL LOW (ref 98–111)
Chloride: 105 mmol/L (ref 98–111)
Creatinine, Ser: 4.07 mg/dL — ABNORMAL HIGH (ref 0.44–1.00)
Creatinine, Ser: 3.44 mg/dL — ABNORMAL HIGH (ref 0.44–1.00)
GFR, Estimated: 12 mL/min — ABNORMAL LOW (ref >60)
GFR, Estimated: 14 mL/min — ABNORMAL LOW (ref >60)
Glucose, Bld: 101 mg/dL — ABNORMAL HIGH (ref 70–99)
Glucose, Bld: 98 mg/dL (ref 70–99)
Potassium: 6.4 mmol/L (ref 3.5–5.1)
Potassium: 4.7 mmol/L (ref 3.5–5.1)
Sodium: 136 mmol/L (ref 135–145)
Sodium: 140 mmol/L (ref 135–145)
Total Bilirubin: 1 mg/dL (ref <1.2)
Total Bilirubin: 0.8 mg/dL (ref <1.2)
Total Protein: 7.3 g/dL (ref 6.5–8.1)
Total Protein: 4.8 g/dL — ABNORMAL LOW (ref 6.5–8.1)

## 2023-10-23 LAB — CBC WITH DIFFERENTIAL/PLATELET
Abs Immature Granulocytes: 0.21 10*3/uL — ABNORMAL HIGH (ref 0.00–0.07)
Abs Immature Granulocytes: 0.08 10*3/uL — ABNORMAL HIGH (ref 0.00–0.07)
Basophils Absolute: 0.1 10*3/uL (ref 0.0–0.1)
Basophils Absolute: 0 10*3/uL (ref 0.0–0.1)
Basophils Relative: 1 %
Basophils Relative: 0 %
Eosinophils Absolute: 0 10*3/uL (ref 0.0–0.5)
Eosinophils Absolute: 0.1 10*3/uL (ref 0.0–0.5)
Eosinophils Relative: 0 %
Eosinophils Relative: 1 %
HCT: 50.9 % — ABNORMAL HIGH (ref 36.0–46.0)
HCT: 45.3 % (ref 36.0–46.0)
Hemoglobin: 16.3 g/dL — ABNORMAL HIGH (ref 12.0–15.0)
Hemoglobin: 14.5 g/dL (ref 12.0–15.0)
Immature Granulocytes: 1 %
Immature Granulocytes: 1 %
Lymphocytes Relative: 12 %
Lymphocytes Relative: 13 %
Lymphs Abs: 2.1 10*3/uL (ref 0.7–4.0)
Lymphs Abs: 1.3 10*3/uL (ref 0.7–4.0)
MCH: 31.3 pg (ref 26.0–34.0)
MCH: 30.9 pg (ref 26.0–34.0)
MCHC: 32 g/dL (ref 30.0–36.0)
MCHC: 32 g/dL (ref 30.0–36.0)
MCV: 97.7 fL (ref 80.0–100.0)
MCV: 96.4 fL (ref 80.0–100.0)
Monocytes Absolute: 0.6 10*3/uL (ref 0.1–1.0)
Monocytes Absolute: 0.4 10*3/uL (ref 0.1–1.0)
Monocytes Relative: 4 %
Monocytes Relative: 4 %
Neutro Abs: 13.8 10*3/uL — ABNORMAL HIGH (ref 1.7–7.7)
Neutro Abs: 8.4 10*3/uL — ABNORMAL HIGH (ref 1.7–7.7)
Neutrophils Relative %: 82 %
Neutrophils Relative %: 81 %
Platelets: 369 10*3/uL (ref 150–400)
Platelets: 291 10*3/uL (ref 150–400)
RBC: 5.21 MIL/uL — ABNORMAL HIGH (ref 3.87–5.11)
RBC: 4.7 MIL/uL (ref 3.87–5.11)
RDW: 14.6 % (ref 11.5–15.5)
RDW: 14.3 % (ref 11.5–15.5)
WBC: 16.8 10*3/uL — ABNORMAL HIGH (ref 4.0–10.5)
WBC: 10.3 10*3/uL (ref 4.0–10.5)
nRBC: 0.4 % — ABNORMAL HIGH (ref 0.0–0.2)
nRBC: 0.2 % (ref 0.0–0.2)

## 2023-10-23 LAB — LACTIC ACID, PLASMA
Lactic Acid, Venous: 6.4 mmol/L (ref 0.5–1.9)
Lactic Acid, Venous: 5.8 mmol/L (ref 0.5–1.9)
Lactic Acid, Venous: 3.2 mmol/L (ref 0.5–1.9)

## 2023-10-23 LAB — PROTIME-INR
INR: 1.2 (ref 0.8–1.2)
INR: 1.3 — ABNORMAL HIGH (ref 0.8–1.2)
Prothrombin Time: 15.6 s — ABNORMAL HIGH (ref 11.4–15.2)
Prothrombin Time: 16.7 s — ABNORMAL HIGH (ref 11.4–15.2)

## 2023-10-23 LAB — LIPASE, BLOOD: Lipase: 48 U/L (ref 11–51)

## 2023-10-23 LAB — RESP PANEL BY RT-PCR (RSV, FLU A&B, COVID)  RVPGX2
Influenza A by PCR: NEGATIVE
Influenza B by PCR: NEGATIVE
Resp Syncytial Virus by PCR: NEGATIVE
SARS Coronavirus 2 by RT PCR: NEGATIVE

## 2023-10-23 LAB — URINALYSIS, W/ REFLEX TO CULTURE (INFECTION SUSPECTED)
Bacteria, UA: NONE SEEN
Bilirubin Urine: NEGATIVE
Glucose, UA: NEGATIVE mg/dL
Hgb urine dipstick: NEGATIVE
Ketones, ur: NEGATIVE mg/dL
Nitrite: NEGATIVE
Protein, ur: 30 mg/dL — AB
Specific Gravity, Urine: 1.028 (ref 1.005–1.030)
pH: 5 (ref 5.0–8.0)

## 2023-10-23 LAB — HEPATITIS PANEL, ACUTE
HCV Ab: NONREACTIVE
Hep A IgM: NONREACTIVE
Hep B C IgM: NONREACTIVE
Hepatitis B Surface Ag: NONREACTIVE

## 2023-10-23 LAB — APTT: aPTT: 24 s (ref 24–36)

## 2023-10-23 LAB — HIV ANTIBODY (ROUTINE TESTING W REFLEX): HIV Screen 4th Generation wRfx: NONREACTIVE

## 2023-10-23 LAB — GLUCOSE, CAPILLARY
Glucose-Capillary: 78 mg/dL (ref 70–99)
Glucose-Capillary: 105 mg/dL — ABNORMAL HIGH (ref 70–99)

## 2023-10-23 LAB — MRSA NEXT GEN BY PCR, NASAL: MRSA by PCR Next Gen: NOT DETECTED

## 2023-10-23 LAB — HEMOGLOBIN A1C
Hgb A1c MFr Bld: 5.6 % (ref 4.8–5.6)
Mean Plasma Glucose: 114.02 mg/dL

## 2023-10-23 LAB — PROCALCITONIN: Procalcitonin: 43.09 ng/mL

## 2023-10-23 LAB — AMMONIA: Ammonia: 56 umol/L — ABNORMAL HIGH (ref 9–35)

## 2023-10-23 MED ORDER — LACTATED RINGERS IV SOLN: INTRAVENOUS | Status: DC

## 2023-10-23 MED ORDER — NOREPINEPHRINE 4 MG/250ML-% IV SOLN
0.0000 ug/min | INTRAVENOUS | Status: DC
  Administered 2023-10-23: 6 ug/min via INTRAVENOUS
  Administered 2023-10-24: 22 ug/min via INTRAVENOUS
  Administered 2023-10-24: 24 ug/min via INTRAVENOUS
  Administered 2023-10-24: 20 ug/min via INTRAVENOUS
  Administered 2023-10-24: 23 ug/min via INTRAVENOUS
  Administered 2023-10-24: 5 ug/min via INTRAVENOUS
  Administered 2023-10-24: 22 ug/min via INTRAVENOUS
  Filled 2023-10-23 (×6): qty 250

## 2023-10-23 MED ORDER — DOCUSATE SODIUM 100 MG PO CAPS: 100.0000 mg | ORAL_CAPSULE | Freq: Two times a day (BID) | ORAL | Status: DC | PRN

## 2023-10-23 MED ORDER — NOREPINEPHRINE 4 MG/250ML-% IV SOLN
2.0000 ug/min | INTRAVENOUS | Status: DC
  Administered 2023-10-23: 2 ug/min via INTRAVENOUS
  Filled 2023-10-23: qty 250

## 2023-10-23 MED ORDER — CALCIUM GLUCONATE 10 % IV SOLN
1.0000 g | Freq: Once | INTRAVENOUS | Status: AC
  Administered 2023-10-23: 1 g via INTRAVENOUS
  Filled 2023-10-23: qty 10

## 2023-10-23 MED ORDER — CHLORHEXIDINE GLUCONATE CLOTH 2 % EX PADS
6.0000 | MEDICATED_PAD | Freq: Every day | CUTANEOUS | Status: DC
  Administered 2023-10-23 – 2023-10-24 (×2): 6 via TOPICAL

## 2023-10-23 MED ORDER — LACTATED RINGERS IV BOLUS
1000.0000 mL | Freq: Once | INTRAVENOUS | Status: AC
  Administered 2023-10-23: 1000 mL via INTRAVENOUS

## 2023-10-23 MED ORDER — SODIUM CHLORIDE 0.9 % IV BOLUS
1000.0000 mL | Freq: Once | INTRAVENOUS | Status: AC
  Administered 2023-10-23: 1000 mL via INTRAVENOUS

## 2023-10-23 MED ORDER — LIDOCAINE HCL (PF) 1 % IJ SOLN
INTRAMUSCULAR | Status: AC
  Filled 2023-10-23: qty 5

## 2023-10-23 MED ORDER — SODIUM CHLORIDE 0.9 % IV SOLN
2.0000 g | Freq: Once | INTRAVENOUS | Status: AC
  Administered 2023-10-23: 2 g via INTRAVENOUS
  Filled 2023-10-23: qty 20

## 2023-10-23 MED ORDER — DEXTROSE 50 % IV SOLN
1.0000 | Freq: Once | INTRAVENOUS | Status: AC
  Administered 2023-10-23: 50 mL via INTRAVENOUS
  Filled 2023-10-23: qty 50

## 2023-10-23 MED ORDER — SODIUM BICARBONATE 8.4 % IV SOLN
50.0000 meq | Freq: Once | INTRAVENOUS | Status: AC
  Administered 2023-10-23: 50 meq via INTRAVENOUS
  Filled 2023-10-23: qty 50

## 2023-10-23 MED ORDER — PIPERACILLIN-TAZOBACTAM IN DEX 2-0.25 GM/50ML IV SOLN
2.2500 g | Freq: Three times a day (TID) | INTRAVENOUS | Status: AC
  Administered 2023-10-24 – 2023-10-25 (×4): 2.25 g via INTRAVENOUS
  Filled 2023-10-23 (×5): qty 50

## 2023-10-23 MED ORDER — ONDANSETRON HCL 4 MG/2ML IJ SOLN
4.0000 mg | Freq: Four times a day (QID) | INTRAMUSCULAR | Status: DC | PRN
  Administered 2023-10-23: 4 mg via INTRAVENOUS
  Filled 2023-10-23: qty 2

## 2023-10-23 MED ORDER — INSULIN ASPART 100 UNIT/ML IV SOLN
5.0000 [IU] | Freq: Once | INTRAVENOUS | Status: AC
Start: 2023-10-23 — End: 2023-10-23
  Administered 2023-10-23: 5 [IU] via INTRAVENOUS

## 2023-10-23 MED ORDER — LACTATED RINGERS IV BOLUS
1000.0000 mL | Freq: Once | INTRAVENOUS | Status: AC
  Administered 2023-10-24: 1000 mL via INTRAVENOUS

## 2023-10-23 MED ORDER — SODIUM CHLORIDE 0.9 % IV BOLUS
2000.0000 mL | Freq: Once | INTRAVENOUS | Status: AC
  Administered 2023-10-23: 2000 mL via INTRAVENOUS

## 2023-10-23 MED ORDER — SODIUM ZIRCONIUM CYCLOSILICATE 10 G PO PACK
10.0000 g | PACK | Freq: Once | ORAL | Status: DC
Start: 2023-10-23 — End: 2023-10-24
  Filled 2023-10-23: qty 2

## 2023-10-23 MED ORDER — NICOTINE 7 MG/24HR TD PT24
7.0000 mg | MEDICATED_PATCH | Freq: Every day | TRANSDERMAL | Status: DC
  Administered 2023-10-23 – 2023-10-24 (×2): 7 mg via TRANSDERMAL
  Filled 2023-10-23 (×3): qty 1

## 2023-10-23 MED ORDER — HEPARIN SODIUM (PORCINE) 5000 UNIT/ML IJ SOLN
5000.0000 [IU] | Freq: Three times a day (TID) | INTRAMUSCULAR | Status: DC
  Administered 2023-10-23 – 2023-10-25 (×5): 5000 [IU] via SUBCUTANEOUS
  Filled 2023-10-23 (×5): qty 1

## 2023-10-23 MED ORDER — INSULIN ASPART 100 UNIT/ML IJ SOLN
0.0000 [IU] | INTRAMUSCULAR | Status: DC
  Administered 2023-10-24: 2 [IU] via SUBCUTANEOUS

## 2023-10-23 MED ORDER — POLYETHYLENE GLYCOL 3350 17 G PO PACK: 17.0000 g | PACK | Freq: Every day | ORAL | Status: DC | PRN

## 2023-10-23 MED ORDER — ORAL CARE MOUTH RINSE: 15.0000 mL | OROMUCOSAL | Status: DC | PRN

## 2023-10-23 MED ORDER — ACETAMINOPHEN 325 MG PO TABS
650.0000 mg | ORAL_TABLET | ORAL | Status: DC | PRN
  Administered 2023-10-23: 650 mg via ORAL
  Filled 2023-10-23 (×2): qty 2

## 2023-10-23 MED ORDER — PIPERACILLIN-TAZOBACTAM 3.375 G IVPB 30 MIN
3.3750 g | Freq: Once | INTRAVENOUS | Status: AC
  Administered 2023-10-23: 3.375 g via INTRAVENOUS
  Filled 2023-10-23 (×2): qty 50

## 2023-10-23 NOTE — H&P (Signed)
NAME:  Erin [name], MRN:  [id number], DOB:  09/19/1956, LOS: 0 ADMISSION DATE:  10/23/2023, CONSULTATION DATE:  10/23/23 REFERRING MD:  Lula Olszewski - [facility] ED, CHIEF COMPLAINT:  diarrhea    History of Present Illness:  67 yo F PMH anxiety depression DM fibromyalgia CKD s/p R BKA who presented to [facility] ICU w CC diarrhea 10/23/23 -- reportedly started day of presentation, but was preceded by 1d of n/v/abd pain. In ED 12/8, labs were remarkable for AKI w hyperK, associated metabolic acidosis, lactic acidosis, elevated LFTs and leukocytosis,.  She was given rocephin, temporizing measures for hyperK, and 2L IVF, then started on peripheral pressors and admitted to [facility] ICU for further management and care.   On arrival to [facility] she is on NE. Continues to endorse abd pain.  In med review, it looks like she is on ozempic, it is not clear when she started this and she cannot provide history about this.   Pertinent  Medical History  CKD S/p R BKA DM Anxiety Depression Fibromyalgia HLD  Hypothyroidism Migraines   Significant Hospital Events: Including procedures, antibiotic start and stop dates in addition to other pertinent events   12/8 2L IVF started on pressors hyperK temporized and transferred to [facility]. CVC placed   Interim History / Subjective:  Arrives to [facility] on 11 NE   Objective   Blood pressure 103/87, pulse 92, temperature 98.4 F (36.9 C), resp. rate 18, height 5\' 2"  (1.575 m), weight 99.8 kg, SpO2 95%.        Intake/Output Summary (Last 24 hours) at 10/23/2023 2049 Last data filed at 10/23/2023 1820 Gross per 24 hour  Intake 3120.33 ml  Output 75 ml  Net 3045.33 ml   Filed Weights   10/23/23 1529  Weight: 99.8 kg    Examination: General: Chronically and acutely ill older adult F NAD  HENT: NCAT pink mm  Lungs: even unlabored,   Cardiovascular: rrr cap refill < 3 sec  Abdomen: reducible midline hernia. Obese, soft  Extremities: r bka  Neuro: Lethargic, oriented x2. Pupils  are 5mm and reactive. Occasional twitching.   GU: foley   Resolved Hospital Problem list     Assessment & Plan:   Shock - mixed hypovolemic and septic -suspect GI source w preceding n/v/d.   -depending on timing of starting ozempic, could be a contributing factor.  P -cont IVF -vanc zosyn. Check MRSA swab and narrow if able   -f/u cx data -is on iso for cdiff r/o -- GI panel and Cdiff sent  -NE for MAP > 65  - will discuss utility of CT a/p -- with her renal dysfunction would have to be non con which would be limiting.   Acute encephalopathy, presume metabolic -no focal def to suggest intracranial process.  -has AKI w uremia, liver dysfxn, shock, sepsis, acidosis etc which likely explain  -isnt febrile; doubt serotonin syndrome/ related to her home meds  P -delirium precautions -correct underlying metabolic abnormalities -send ammonia   Acute resp failure w hypoxia  Pulmonary HTN Tobacco use  P -wean O2 for goal >92 -nicotine patch, encourage cessation  -IS, pulm hygiene -needs lung ca screening CT output at some point   AKI on CKD, unclear stage  Hyperkalemia  Hypocalcemia  -she can't provide hx re her kidney dz and chart review is not clarifying.  -received bicarb CaCl and insulin/dextrose at OSH  P -CMP  -IVF -strict I?O   Elevated LFTs  P -follow PRN -send hep  panel -pending CT, consider RUQ [place]-- notably tbili is normal though  AGMA Lactic acidosis  P -follow  -add'l amp of bicarb now  -as above repeat CMP pending   DM2 P -SSI -holding home metformin and ozempic   ?hernia -CT a/p   Best Practice (right click and "Reselect all SmartList Selections" daily)   Diet/type: NPO DVT prophylaxis prophylactic heparin  Pressure ulcer(s): pressure ulcer assessment deferred  GI prophylaxis: N/A Lines: Central line Foley:  Yes, and it is still needed Code Status:  full code -- by default  Last date of multidisciplinary goals of care discussion  [pending. Unable to reach husband. Pt updated but a bit encephalopathic ]  Labs   CBC: Recent Labs  Lab 10/23/23 1535 10/23/23 1620  WBC 16.8*  --   NEUTROABS 13.8*  --   HGB 16.3* 18.0*  HCT 50.9* 53.0*  MCV 97.7  --   PLT 369  --     Basic Metabolic Panel: Recent Labs  Lab 10/23/23 1532 10/23/23 1620  NA 136 132*  K 6.4* 6.1*  CL 94* 99  CO2 17*  --   GLUCOSE 101* 94  BUN 67* 60*  CREATININE 4.07* 3.90*  CALCIUM 8.6*  --    GFR: Estimated Creatinine Clearance: 15.9 mL/min (A) (by C-G formula based on SCr of 3.9 mg/dL (H)). Recent Labs  Lab 10/23/23 1535 10/23/23 1552 10/23/23 1759  WBC 16.8*  --   --   LATICACIDVEN  --  6.4* 5.8*    Liver Function Tests: Recent Labs  Lab 10/23/23 1532  AST 248*  ALT 176*  ALKPHOS 92  BILITOT 1.0  PROT 7.3  ALBUMIN 3.2*   Recent Labs  Lab 10/23/23 1532  LIPASE 48   No results for input(s): "AMMONIA" in the last 168 hours.  ABG    Component Value Date/Time   TCO2 19 (L) 10/23/2023 1620     Coagulation Profile: Recent Labs  Lab 10/23/23 1552  INR 1.2    Cardiac Enzymes: No results for input(s): "CKTOTAL", "CKMB", "CKMBINDEX", "TROPONINI" in the last 168 hours.  HbA1C: Hgb A1c MFr Bld  Date/Time Value Ref Range Status  07/15/2017 10:15 AM 5.8 (H) 4.8 - 5.6 % Final    Comment:    (NOTE) Pre diabetes:          5.7%-6.4% Diabetes:              >6.4% Glycemic control for   <7.0% adults with diabetes   12/28/2016 06:08 AM 6.1 (H) 4.8 - 5.6 % Final    Comment:    (NOTE)         Pre-diabetes: 5.7 - 6.4         Diabetes: >6.4         Glycemic control for adults with diabetes: <7.0     CBG: Recent Labs  Lab 10/23/23 1936  GLUCAP 78    Review of Systems:   Review of Systems  Constitutional:  Positive for malaise/fatigue.  Eyes: Negative.   Respiratory:  Positive for cough and shortness of breath.   Cardiovascular: Negative.   Gastrointestinal:  Positive for abdominal pain, diarrhea,  nausea and vomiting. Negative for blood in stool and melena.  Genitourinary: Negative.   Musculoskeletal: Negative.   Skin: Negative.   Neurological:  Positive for weakness.  Psychiatric/Behavioral:  Positive for depression. The patient is nervous/anxious.      Past Medical History:  She,  has a past medical history of Arthritis, Carpal tunnel syndrome,  Charcot ankle, right, Chronic kidney disease, Chronic pain, Complication of anesthesia, Depression, Diabetes mellitus, Diabetic neuropathy (HCC), Fibromyalgia, GERD (gastroesophageal reflux disease), Headache, Hernia, History of blood transfusion, Hyperlipemia, Hypertension, Miscarriage, Peripheral neuropathy, PONV (postoperative nausea and vomiting), and TMJ syndrome.   Surgical History:   Past Surgical History:  Procedure Laterality Date   ABDOMINAL SURGERY     AMPUTATION Right 07/15/2017   Procedure: RIGHT BELOW KNEE AMPUTATION;  Surgeon: Nadara Mustard, MD;  Location: [facility] OR;  Service: Orthopedics;  Laterality: Right;   BUNIONECTOMY Bilateral    2 separate surgies   COLONOSCOPY     PARTIAL HYSTERECTOMY  1983   TMJ ARTHROPLASTY     left side x 2   TONSILLECTOMY     TUBAL LIGATION  1983   Umbilical hernia     x2   VAGINAL HYSTERECTOMY  2002     Social History:   reports that she has been smoking cigarettes. She started smoking about 46 years ago. She has a 32 pack-year smoking history. She has never used smokeless tobacco. She reports that she does not drink alcohol and does not use drugs.   Family History:  Her family history includes Aneurysm in her father; CAD in her father; Cancer in her father; Heart disease in her father; Pancreatic cancer in her mother.   Allergies No Known Allergies   Home Medications  Prior to Admission medications   Medication Sig Start Date End Date Taking? Authorizing Provider  Albuterol Sulfate 108 (90 Base) MCG/ACT AEPB Inhale 2 puffs into the lungs every 6 (six) hours as needed (for  wheezing). 12/28/16  Yes Ahmed, Elyn Peers, MD  benazepril-hydrochlorthiazide (LOTENSIN HCT) 20-25 MG per tablet Take 1 tablet by mouth daily.   Yes [provider]  buprenorphine (SUBUTEX) 2 MG SUBL SL tablet Place 2 mg under the tongue 3 (three) times daily.   Yes [provider]  CALCIUM PO Take 1 tablet by mouth daily.   Yes [provider]  Cholecalciferol (VITAMIN D-3 PO) Take 1 tablet by mouth daily.   Yes [provider]  dapagliflozin propanediol (FARXIGA) 10 MG TABS tablet Take 10 mg by mouth daily.   Yes [provider]  DULoxetine (CYMBALTA) 60 MG capsule Take 120 mg by mouth daily.   Yes Karle Plumber, MD  escitalopram (LEXAPRO) 10 MG tablet Take 10 mg by mouth daily.   Yes [provider]  ferrous sulfate 325 (65 FE) MG tablet Take 325 mg by mouth daily with breakfast.   Yes [provider]  levothyroxine (SYNTHROID, LEVOTHROID) 50 MCG tablet Take 50 mcg by mouth daily before breakfast.  08/15/15  Yes [provider]  metFORMIN (GLUCOPHAGE) 500 MG tablet Take 500 mg by mouth 2 (two) times daily. 03/24/18  Yes [provider]  nystatin (MYCOSTATIN/NYSTOP) powder Apply 1 Application topically 3 (three) times daily as needed (skin irritation).   Yes [provider]  pregabalin (LYRICA) 200 MG capsule Take 200 mg by mouth 3 (three) times daily.   Yes Karle Plumber, MD  rosuvastatin (CRESTOR) 40 MG tablet Take 40 mg by mouth at bedtime.   Yes [provider]  Semaglutide, 2 MG/DOSE, (OZEMPIC, 2 MG/DOSE,) 8 MG/3ML SOPN Inject 2 mg into the skin every Friday.   Yes [provider]  SUMAtriptan (IMITREX) 100 MG tablet Take 100 mg by mouth every 2 (two) hours as needed for migraine.  08/15/15  Yes [provider]  azithromycin (ZITHROMAX) 250 MG tablet Take 250-500  mg by mouth See admin instructions. Take 2 tablets (500mg ) on day 1, then take 250mg  (1 tablet) daily on days  2-5. Patient not taking: Reported on 10/23/2023    [provider]  predniSONE (STERAPRED UNI-PAK 21 TAB) 10 MG (21) TBPK tablet Take 10-60 mg by mouth See admin instructions. Take 6 tablets (60mg ) on day 1, 5 tablets (50mg ) on day 2, 4 tablets (40mg ) on day 3, 3 tablets (30mg ) on day 4, 2 tablets (20mg ) on day 5, 1 tablet (10mg ) on day 6. Stop. Patient not taking: Reported on 10/23/2023    [provider]     Critical care time: 50 min      CRITICAL CARE Performed by: Lanier Clam   Total critical care time: 50 minutes  Critical care time was exclusive of separately billable procedures and treating other patients. Critical care was necessary to treat or prevent imminent or life-threatening deterioration.  Critical care was time spent personally by me on the following activities: development of treatment plan with patient and/or surrogate as well as nursing, discussions with consultants, evaluation of patient's response to treatment, examination of patient, obtaining history from patient or surrogate, ordering and performing treatments and interventions, ordering and review of laboratory studies, ordering and review of radiographic studies, pulse oximetry and re-evaluation of patient's condition.  Tessie Fass MSN, AGACNP-BC [facility] Pulmonary/Critical Care Medicine Amion for pager  10/23/2023, 8:49 PM

## 2023-10-23 NOTE — Progress Notes (Signed)
Pharmacy Antibiotic Note  Erin [name] is a 67 y.o. female admitted on 10/23/2023 with  diarrhea .  Pharmacy has been consulted for zosyn dosing.  Pt presented to [facility] with diarrhea. She was tx here to further management. Suspected for abd infection. D/w Tessie Fass and we will use zosyn alone. She is in AKI  Scr up to 3.9  Plan: Zosyn 3.375g IV x1 then 2.25g IV q8   Height: 5\' 2"  (157.5 cm) Weight: 99.8 kg (220 lb) IBW/kg (Calculated) : 50.1  Temp (24hrs), Avg:97.2 F (36.2 C), Min:95.3 F (35.2 C), Max:98.4 F (36.9 C)  Recent Labs  Lab 10/23/23 1532 10/23/23 1535 10/23/23 1552 10/23/23 1620 10/23/23 1759  WBC  --  16.8*  --   --   --   CREATININE 4.07*  --   --  3.90*  --   LATICACIDVEN  --   --  6.4*  --  5.8*    Estimated Creatinine Clearance: 15.9 mL/min (A) (by C-G formula based on SCr of 3.9 mg/dL (H)).    No Known Allergies  Antimicrobials this admission: 12/8 ceftriaxone x1 12/8 zosyn>>  Dose adjustments this admission:   Microbiology results: 12/8 blood>> 12/8 c.diff>>  Ulyses Southward, PharmD, BCIDP, AAHIVP, CPP Infectious Disease Pharmacist 10/23/2023 9:14 PM

## 2023-10-23 NOTE — ED Notes (Signed)
Attempted x 2 for IV 2nd access without success

## 2023-10-23 NOTE — ED Notes (Signed)
4th liter NS bolus hung per VO Dr. Estell Harpin

## 2023-10-23 NOTE — Procedures (Cosign Needed)
Central Venous Catheter Insertion Procedure Note  MARTHELLA TARBUTTON  [id number]  1956-02-27  Date:10/23/23  Time:9:00 PM   Provider Performing:Tyshay Adee [name]   Procedure: Insertion of Non-tunneled Central Venous [phone]) with US guidance (66063)   Indication(s) Medication administration And Difficult abscess  Consent Unable to obtain consent due to inability to find a medical decision maker for patient.  All reasonable efforts were made.  Another independent medical provider, Delorise Shiner NP , confirmed the benefits of this procedure outweigh the risks. Patient verbally agrees  Anesthesia Topical only with 1% lidocaine   Timeout Verified patient identification, verified procedure, site/side was marked, verified correct patient position, special equipment/implants available, medications/allergies/relevant history reviewed, required imaging and test results available.  Sterile Technique Maximal sterile technique including full sterile barrier drape, hand hygiene, sterile gown, sterile gloves, mask, hair covering, sterile ultrasound probe cover (if used).  Procedure Description Area of catheter insertion was cleaned with chlorhexidine and draped in sterile fashion.  With real-time ultrasound guidance a central venous catheter was placed into the right internal jugular vein. Nonpulsatile blood flow and easy flushing noted in all ports.  The catheter was sutured in place and sterile dressing applied.  Complications/Tolerance None; patient tolerated the procedure well. Chest X-ray is ordered to verify placement for internal jugular or subclavian cannulation.  Line pulled back 3 cm and resutured.  EBL Minimal  Specimen(s) None

## 2023-10-23 NOTE — ED Notes (Signed)
ED Provider at bedside. 

## 2023-10-23 NOTE — Progress Notes (Signed)
Elink following for sepsis protocol. Confirmed w/ bedside RN that antibiotic given before antibiotic 2/2 to difficult stick.

## 2023-10-23 NOTE — Progress Notes (Addendum)
eLink Physician-Brief Progress Note Patient Name: CHANEE SPORN DOB: 1956/08/21 MRN: [id number]   Date of Service  10/23/2023  HPI/Events of Note  67/F with DM, depression, CKD, s/p R BKA, presenting to [facility] ICU with diarrhea, preceded by nausea, vomiting and abdominal pain.  She was noted to have AKI, hyperkalemia and lactic acidosis.  Pt was given ceftriaxone, medical management for hyperkalemia and started on pressors.   BP 103/87, HR 94, RR 18, O2 sats 95% on nasal cannula.   eICU Interventions  Central line is being inserted now. Insert arterial line as well.  Ground team is at the bedside.  Continue titrating levophed to maintain MAP >65.  Continue antibiotics.  Insulin for glucose control.  Heparin for DVT prophylaxis.       Intervention Category Evaluation Type: New Patient Evaluation  Larinda Buttery 10/23/2023, 8:29 PM  10:49 PM Notified of critical lactate at 3.2 but has trended down from 5.8.   Plan> Continue current management plan.  Continue to trend lactate.

## 2023-10-23 NOTE — Progress Notes (Signed)
NP stated she would hold off on A-line for now.

## 2023-10-23 NOTE — ED Provider Notes (Signed)
[place] EMERGENCY DEPARTMENT AT [facility] Provider Note   CSN: [id number] Arrival date & time: 10-Nov-2023  1517     History  Chief Complaint  Patient presents with   Diarrhea    Erin [name] is a 67 y.o. female.  With a history of hypertension, anxiety, depression, fibromyalgia, CKD, diabetes, arthritis presenting to the ED for evaluation of diarrhea.  She states the diarrhea began yesterday.  She has had approximately 10 episodes since yesterday.  She denies any melena or hematochezia.  She reports she was seen by urgent care 1 week ago and started on a steroid and antibiotic for bronchitis.  She is unsure of which antibiotic she was started on.  Her shortness of breath improved but worsens again today.  She denies any cough.  No abdominal pain.  She has had 2 episodes of vomiting today.  No hematemesis.  She reports subjective fevers.   Diarrhea Associated symptoms: vomiting        Home Medications Prior to Admission medications   Medication Sig Start Date End Date Taking? Authorizing Provider  Albuterol Sulfate 108 (90 Base) MCG/ACT AEPB Inhale 2 puffs into the lungs every 6 (six) hours as needed (for wheezing). 12/28/16  Yes Ahmed, Elyn Peers, MD  benazepril-hydrochlorthiazide (LOTENSIN HCT) 20-25 MG per tablet Take 1 tablet by mouth daily.   Yes [provider]  buprenorphine (SUBUTEX) 2 MG SUBL SL tablet Place 2 mg under the tongue 3 (three) times daily.   Yes [provider]  CALCIUM PO Take 1 tablet by mouth daily.   Yes [provider]  Cholecalciferol (VITAMIN D-3 PO) Take 1 tablet by mouth daily.   Yes [provider]  dapagliflozin propanediol (FARXIGA) 10 MG TABS tablet Take 10 mg by mouth daily.   Yes [provider]  DULoxetine (CYMBALTA) 60 MG capsule Take 120 mg by mouth daily.   Yes Karle Plumber, MD  escitalopram (LEXAPRO) 10 MG tablet Take 10 mg by mouth daily.   Yes [provider]  ferrous  sulfate 325 (65 FE) MG tablet Take 325 mg by mouth daily with breakfast.   Yes [provider]  levothyroxine (SYNTHROID, LEVOTHROID) 50 MCG tablet Take 50 mcg by mouth daily before breakfast.  08/15/15  Yes [provider]  metFORMIN (GLUCOPHAGE) 500 MG tablet Take 500 mg by mouth 2 (two) times daily. 03/24/18  Yes [provider]  nystatin (MYCOSTATIN/NYSTOP) powder Apply 1 Application topically 3 (three) times daily as needed (skin irritation).   Yes [provider]  pregabalin (LYRICA) 200 MG capsule Take 200 mg by mouth 3 (three) times daily.   Yes Karle Plumber, MD  rosuvastatin (CRESTOR) 40 MG tablet Take 40 mg by mouth at bedtime.   Yes [provider]  Semaglutide, 2 MG/DOSE, (OZEMPIC, 2 MG/DOSE,) 8 MG/3ML SOPN Inject 2 mg into the skin every Friday.   Yes [provider]  SUMAtriptan (IMITREX) 100 MG tablet Take 100 mg by mouth every 2 (two) hours as needed for migraine.  08/15/15  Yes [provider]  azithromycin (ZITHROMAX) 250 MG tablet Take 250-500 mg by mouth See admin instructions. Take 2 tablets (500mg ) on day 1, then take 250mg  (1 tablet) daily on days 2-5. Patient not taking: Reported on 11/10/23    [provider]  predniSONE (STERAPRED UNI-PAK 21 TAB) 10 MG (21) TBPK tablet Take 10-60 mg by mouth See admin instructions. Take 6 tablets (60mg ) on day 1, 5 tablets (50mg ) on  day 2, 4 tablets (40mg ) on day 3, 3 tablets (30mg ) on day 4, 2 tablets (20mg ) on day 5, 1 tablet (10mg ) on day 6. Stop. Patient not taking: Reported on October 30, 2023    [provider]      Allergies    Patient has no known allergies.    Review of Systems   Review of Systems  Gastrointestinal:  Positive for diarrhea, nausea and vomiting.  All other systems reviewed and are negative.   Physical Exam Updated Vital Signs BP 94/80   Pulse 90   Temp (!) 97.1 F (36.2 C)   Resp 19   Ht 5\' 2"  (1.575 m)   Wt 99.8 kg   SpO2 95%    BMI 40.24 kg/m  Physical Exam Vitals and nursing note reviewed.  Constitutional:      Appearance: She is well-developed. She is obese.  HENT:     Head: Normocephalic and atraumatic.     Mouth/Throat:     Mouth: Mucous membranes are dry.     Comments: Mucous membranes dry, lips cracked Eyes:     Conjunctiva/sclera: Conjunctivae normal.  Cardiovascular:     Rate and Rhythm: Normal rate and regular rhythm.     Heart sounds: No murmur heard. Pulmonary:     Effort: Pulmonary effort is normal. No respiratory distress.     Breath sounds: Normal breath sounds.  Abdominal:     Palpations: Abdomen is soft.     Tenderness: There is no abdominal tenderness. There is no guarding.  Musculoskeletal:        General: No swelling.     Cervical back: Neck supple.     Comments: Right BKA  Skin:    General: Skin is warm and dry.     Capillary Refill: Capillary refill takes less than 2 seconds.     Comments: Chronic appearing ulcer to left anterior lower leg  Neurological:     General: No focal deficit present.     Mental Status: She is alert and oriented to person, place, and time.  Psychiatric:        Mood and Affect: Mood normal.     ED Results / Procedures / Treatments   Labs (all labs ordered are listed, but only abnormal results are displayed) Labs Reviewed  COMPREHENSIVE METABOLIC PANEL - Abnormal; Notable for the following components:      Result Value   Potassium 6.4 (*)    Chloride 94 (*)    CO2 17 (*)    Glucose, Bld 101 (*)    BUN 67 (*)    Creatinine, Ser 4.07 (*)    Calcium 8.6 (*)    Albumin 3.2 (*)    AST 248 (*)    ALT 176 (*)    GFR, Estimated 12 (*)    Anion gap 25 (*)    All other components within normal limits  CBC WITH DIFFERENTIAL/PLATELET - Abnormal; Notable for the following components:   WBC 16.8 (*)    RBC 5.21 (*)    Hemoglobin 16.3 (*)    HCT 50.9 (*)    nRBC 0.4 (*)    Neutro Abs 13.8 (*)    Abs Immature Granulocytes 0.21 (*)    All other  components within normal limits  LACTIC ACID, PLASMA - Abnormal; Notable for the following components:   Lactic Acid, Venous 6.4 (*)    All other components within normal limits  LACTIC ACID, PLASMA - Abnormal; Notable for the following components:   Lactic  Acid, Venous 5.8 (*)    All other components within normal limits  PROTIME-INR - Abnormal; Notable for the following components:   Prothrombin Time 15.6 (*)    All other components within normal limits  URINALYSIS, W/ REFLEX TO CULTURE (INFECTION SUSPECTED) - Abnormal; Notable for the following components:   Color, Urine AMBER (*)    APPearance HAZY (*)    Protein, ur 30 (*)    Leukocytes,Ua TRACE (*)    All other components within normal limits  I-STAT CHEM 8, ED - Abnormal; Notable for the following components:   Sodium 132 (*)    Potassium 6.1 (*)    BUN 60 (*)    Creatinine, Ser 3.90 (*)    Calcium, Ion 0.93 (*)    TCO2 19 (*)    Hemoglobin 18.0 (*)    HCT 53.0 (*)    All other components within normal limits  RESP PANEL BY RT-PCR (RSV, FLU A&B, COVID)  RVPGX2  CULTURE, BLOOD (ROUTINE X 2)  CULTURE, BLOOD (ROUTINE X 2)  C DIFFICILE QUICK SCREEN W PCR REFLEX    GASTROINTESTINAL PANEL BY PCR, STOOL (REPLACES STOOL CULTURE)  LIPASE, BLOOD  APTT    EKG None  Radiology DG Chest Portable 1 View  Result Date: 10/23/2023 CLINICAL DATA:  Shortness of breath. Generalized weakness with diarrhea for 2 days. EXAM: PORTABLE CHEST 1 VIEW COMPARISON:  Radiographs 01/26/2023 and 12/27/2016. Chest CT 06/13/2017. FINDINGS: 1635 hours. The heart size and mediastinal contours are normal. The lungs are clear. There is no pleural effusion or pneumothorax. No acute osseous findings are identified. IMPRESSION: Stable chest.  No evidence of acute cardiopulmonary process. Electronically Signed   By: Carey Bullocks M.D.   On: 10/23/2023 16:43    Procedures .Critical Care  Performed by: Michelle Piper, PA-C Authorized by: Michelle Piper, PA-C   Critical care provider statement:    Critical care time (minutes):  90   Critical care was necessary to treat or prevent imminent or life-threatening deterioration of the following conditions:  Renal failure, sepsis and shock   Critical care was time spent personally by me on the following activities:  Development of treatment plan with patient or surrogate, discussions with consultants, evaluation of patient's response to treatment, examination of patient, ordering and review of laboratory studies, ordering and review of radiographic studies, ordering and performing treatments and interventions, pulse oximetry, re-evaluation of patient's condition and review of old charts   Care discussed with: admitting provider       Medications Ordered in ED Medications  sodium zirconium cyclosilicate (LOKELMA) packet 10 g (0 g Oral Hold 10/23/23 1736)  norepinephrine (LEVOPHED) 4mg  in (0.016 mg/mL) premix infusion (10 mcg/min Intravenous Infusion Verify 10/23/23 1820)  sodium chloride 0.9 % bolus 2,000 mL (0 mLs Intravenous Stopped 10/23/23 1816)  cefTRIAXone (ROCEPHIN) 2 g in sodium chloride 0.9 % 100 mL IVPB (0 g Intravenous Stopped 10/23/23 1727)  sodium chloride 0.9 % bolus 1,000 mL (0 mLs Intravenous Stopped 10/23/23 1735)  calcium gluconate inj 10% (1 g) URGENT USE ONLY! (1 g Intravenous Given 10/23/23 1725)  insulin aspart (novoLOG) injection 5 Units (5 Units Intravenous Given 10/23/23 1726)    And  dextrose 50 % solution 50 mL (50 mLs Intravenous Given 10/23/23 1728)  sodium bicarbonate injection 50 mEq (50 mEq Intravenous Given 10/23/23 1722)  sodium chloride 0.9 % bolus 1,000 mL (1,000 mLs Intravenous New Bag/Given 10/23/23 1823)    ED Course/ Medical Decision Making/ A&P Clinical  Course as of 10/23/23 Shirlean Schlein Oct 23, 2023  1742 Consulted with intensivist Dr. Vassie Loll who recommends increasing blood pressure to systolic of 90, will admit [AS]    Clinical Course User  Index [AS] Lula Olszewski Edsel Petrin, PA-C                                 Medical Decision Making Amount and/or Complexity of Data Reviewed Labs: ordered. Radiology: ordered. ECG/medicine tests: ordered.  Risk OTC drugs. Prescription drug management. Decision regarding hospitalization.  This patient presents to the ED for concern of diarrhea, this involves an extensive number of treatment options, and is a complaint that carries with it a high risk of complications and morbidity. The differential diagnosis of diarrhea includes but is not limited to Viral- norovirus/rotavirus; Bacterial-Campylobacter,Shigella, Salmonella, Escherichia coli, E. coli 0157:H7, Yersinia enterocolitica, Vibrio cholerae, Clostridium difficile. Parasitic- Giardia lamblia, Cryptosporidium,Entamoeba histolytica,Cyclospora, Microsporidium. Toxin- Staphylococcus aureus, Bacillus cereus. Noninfectious causes include GI Bleed, Appendicitis, Mesenteric Ischemia, Diverticulitis, Adrenal Crisis, Thyroid Storm, Toxicologic exposures, Antibiotic or drug-associated, inflammatory bowel disease.   My initial workup includes labs, EKG, code sepsis  Additional history obtained from: Nursing notes from this visit. EMS provides a portion of the history gave 500 mL normal saline prior to arrival  I ordered, reviewed and interpreted labs which include: Sepsis labs.  Initial lactate 6.1.  Leukocytosis of 16.8.  Hemoglobin of 16.3.  Potassium 16.4.  Bicarb of 17.  Chloride of 94.  BUN of 67 with creatinine of 4.07 with a baseline of 1.8.  AST of 248, ALT of 176, anion gap of 25.  I ordered imaging studies including chest x-ray I independently visualized and interpreted imaging which showed no acute cardiopulmonary abnormalities. I agree with the radiologist interpretation  Cardiac Monitoring:  The patient was maintained on a cardiac monitor.  I personally viewed and interpreted the cardiac monitored which showed an underlying rhythm  of: NSR  Consultations Obtained:  I requested consultation with critical care Dr. Vassie Loll,  and discussed lab and imaging findings as well as pertinent plan - they recommend: Admission, transfer to [facility]  Hypothermic to 97.1 F, hypotensive initially to 60s over 40s, hypoxic to 90% on room air, 95% on 1 L nasal cannula.  67 year old female presenting for evaluation of diarrhea and dehydration.  Has had about 10 episodes of diarrhea since yesterday.  She episodes of vomiting as well.  Decreased oral intake recently.  Initially hypothermic, borderline tachycardic and hypotensive so code sepsis was initiated.  She appears very dry on physical exam.  She has a chronic appearing ulcer to the right lower leg.  No decubitus ulcer.  Lab workup as stated above.  Patient persistently hypotensive after 2 L bolus so critical care was consulted who recommends Levophed, titrated to systolic blood pressure of 90, transfer to [facility].  This was achieved here in the emergency department.  Discussed this plan with the patient and family at bedside.  They are in agreement.  Unknown source of sepsis but thought to be secondary to profound dehydration.  Stable at the time of transfer.  Patient's case discussed with Dr. Estell Harpin who agrees with plan.   Note: Portions of this report may have been transcribed using voice recognition software. Every effort was made to ensure accuracy; however, inadvertent computerized transcription errors may still be present.         Final Clinical Impression(s) / ED Diagnoses Final diagnoses:  Septic shock (HCC)  Acute renal failure, unspecified acute renal failure type (HCC)  Diarrhea, unspecified type    Rx / DC Orders ED Discharge Orders     None         Mora Bellman 08-Nov-2023 Cherlynn Kaiser, MD 10/25/23 [phone]

## 2023-10-23 NOTE — ED Triage Notes (Signed)
Pt BIB [facility] for generalized weakness, reported pt with diarrhea x 2 days from home, husband at home as well. Pt had c/o SOB RA sats 95% and [place] placed on 2 L/m, sats increased to 98%, cbg 110, BP 105/68, HR 86. Pt with rt BKA

## 2023-10-23 NOTE — ED Notes (Signed)
Staff member attempting [place]

## 2023-10-24 ENCOUNTER — Inpatient Hospital Stay (HOSPITAL_COMMUNITY): Payer: Medicare Other

## 2023-10-24 DIAGNOSIS — J9601 Acute respiratory failure with hypoxia: Secondary | ICD-10-CM

## 2023-10-24 DIAGNOSIS — N179 Acute kidney failure, unspecified: Secondary | ICD-10-CM

## 2023-10-24 DIAGNOSIS — I272 Pulmonary hypertension, unspecified: Secondary | ICD-10-CM

## 2023-10-24 DIAGNOSIS — G9341 Metabolic encephalopathy: Secondary | ICD-10-CM | POA: Diagnosis not present

## 2023-10-24 DIAGNOSIS — R6521 Severe sepsis with septic shock: Secondary | ICD-10-CM

## 2023-10-24 DIAGNOSIS — A419 Sepsis, unspecified organism: Principal | ICD-10-CM

## 2023-10-24 DIAGNOSIS — E119 Type 2 diabetes mellitus without complications: Secondary | ICD-10-CM

## 2023-10-24 LAB — BASIC METABOLIC PANEL WITH GFR
Anion gap: 17 — ABNORMAL HIGH (ref 5–15)
Anion gap: 17 — ABNORMAL HIGH (ref 5–15)
BUN: 70 mg/dL — ABNORMAL HIGH (ref 8–23)
BUN: 68 mg/dL — ABNORMAL HIGH (ref 8–23)
CO2: 17 mmol/L — ABNORMAL LOW (ref 22–32)
CO2: 16 mmol/L — ABNORMAL LOW (ref 22–32)
Calcium: 6.7 mg/dL — ABNORMAL LOW (ref 8.9–10.3)
Calcium: 6.4 mg/dL — CL (ref 8.9–10.3)
Chloride: 104 mmol/L (ref 98–111)
Chloride: 104 mmol/L (ref 98–111)
Creatinine, Ser: 3.69 mg/dL — ABNORMAL HIGH (ref 0.44–1.00)
Creatinine, Ser: 3.45 mg/dL — ABNORMAL HIGH (ref 0.44–1.00)
GFR, Estimated: 13 mL/min — ABNORMAL LOW
GFR, Estimated: 14 mL/min — ABNORMAL LOW
Glucose, Bld: 98 mg/dL (ref 70–99)
Glucose, Bld: 143 mg/dL — ABNORMAL HIGH (ref 70–99)
Potassium: 5.4 mmol/L — ABNORMAL HIGH (ref 3.5–5.1)
Potassium: 5.5 mmol/L — ABNORMAL HIGH (ref 3.5–5.1)
Sodium: 138 mmol/L (ref 135–145)
Sodium: 137 mmol/L (ref 135–145)

## 2023-10-24 LAB — POCT I-STAT 7, (LYTES, BLD GAS, ICA,H+H)
Acid-base deficit: 12 mmol/L — ABNORMAL HIGH (ref 0.0–2.0)
Bicarbonate: 14.2 mmol/L — ABNORMAL LOW (ref 20.0–28.0)
Calcium, Ion: 0.96 mmol/L — ABNORMAL LOW (ref 1.15–1.40)
HCT: 47 % — ABNORMAL HIGH (ref 36.0–46.0)
Hemoglobin: 16 g/dL — ABNORMAL HIGH (ref 12.0–15.0)
O2 Saturation: 83 %
Patient temperature: 98.2
Potassium: 6.1 mmol/L — ABNORMAL HIGH (ref 3.5–5.1)
Sodium: 132 mmol/L — ABNORMAL LOW (ref 135–145)
TCO2: 15 mmol/L — ABNORMAL LOW (ref 22–32)
pCO2 arterial: 32.4 mm[Hg] (ref 32–48)
pH, Arterial: 7.247 — ABNORMAL LOW (ref 7.35–7.45)
pO2, Arterial: 54 mm[Hg] — ABNORMAL LOW (ref 83–108)

## 2023-10-24 LAB — LACTIC ACID, PLASMA: Lactic Acid, Venous: 2.4 mmol/L (ref 0.5–1.9)

## 2023-10-24 LAB — ECHOCARDIOGRAM COMPLETE
Area-P 1/2: 2.42 cm2
Height: 62 in
S' Lateral: 1.9 cm
Weight: 2931.24 [oz_av]

## 2023-10-24 LAB — CBC
HCT: 42.9 % (ref 36.0–46.0)
Hemoglobin: 14 g/dL (ref 12.0–15.0)
MCH: 31 pg (ref 26.0–34.0)
MCHC: 32.6 g/dL (ref 30.0–36.0)
MCV: 94.9 fL (ref 80.0–100.0)
Platelets: 198 K/uL (ref 150–400)
RBC: 4.52 MIL/uL (ref 3.87–5.11)
RDW: 14.5 % (ref 11.5–15.5)
WBC: 11.4 K/uL — ABNORMAL HIGH (ref 4.0–10.5)
nRBC: 0.2 % (ref 0.0–0.2)

## 2023-10-24 LAB — GLUCOSE, CAPILLARY
Glucose-Capillary: 101 mg/dL — ABNORMAL HIGH (ref 70–99)
Glucose-Capillary: 114 mg/dL — ABNORMAL HIGH (ref 70–99)
Glucose-Capillary: 134 mg/dL — ABNORMAL HIGH (ref 70–99)
Glucose-Capillary: 72 mg/dL (ref 70–99)
Glucose-Capillary: 87 mg/dL (ref 70–99)
Glucose-Capillary: 91 mg/dL (ref 70–99)

## 2023-10-24 MED ORDER — SODIUM ZIRCONIUM CYCLOSILICATE 10 G PO PACK
10.0000 g | PACK | Freq: Once | ORAL | Status: DC
  Filled 2023-10-24: qty 1

## 2023-10-24 MED ORDER — LACTATED RINGERS IV BOLUS
1000.0000 mL | Freq: Once | INTRAVENOUS | Status: AC
  Administered 2023-10-24: 1000 mL via INTRAVENOUS

## 2023-10-24 MED ORDER — CALCIUM GLUCONATE-NACL 1-0.675 GM/50ML-% IV SOLN
1.0000 g | Freq: Once | INTRAVENOUS | Status: AC
  Administered 2023-10-24: 1000 mg via INTRAVENOUS
  Filled 2023-10-24: qty 50

## 2023-10-24 MED ORDER — VASOPRESSIN 20 UNITS/100 ML INFUSION FOR SHOCK
0.0000 [IU]/min | INTRAVENOUS | Status: DC
Start: 2023-10-24 — End: 2023-10-25
  Administered 2023-10-24 – 2023-10-25 (×4): 0.03 [IU]/min via INTRAVENOUS
  Filled 2023-10-24 (×4): qty 100

## 2023-10-24 MED ORDER — SODIUM POLYSTYRENE SULFONATE 15 GM/60ML CO SUSP
30.0000 g | Freq: Once | Status: AC
  Administered 2023-10-24: 30 g via RECTAL
  Filled 2023-10-24: qty 120

## 2023-10-24 MED ORDER — SODIUM CHLORIDE 0.9 % IV SOLN: INTRAVENOUS | Status: AC | PRN

## 2023-10-24 MED ORDER — SODIUM ZIRCONIUM CYCLOSILICATE 10 G PO PACK: 10.0000 g | PACK | Freq: Once | ORAL | Status: DC

## 2023-10-24 MED ORDER — ACETAMINOPHEN 650 MG RE SUPP: 650.0000 mg | RECTAL | Status: DC | PRN

## 2023-10-24 MED ORDER — PERFLUTREN LIPID MICROSPHERE
1.0000 mL | INTRAVENOUS | Status: AC | PRN
  Administered 2023-10-24: 4 mL via INTRAVENOUS

## 2023-10-24 MED ORDER — CALCIUM GLUCONATE-NACL 2-0.675 GM/100ML-% IV SOLN
2.0000 g | Freq: Once | INTRAVENOUS | Status: AC
  Administered 2023-10-24: 2000 mg via INTRAVENOUS
  Filled 2023-10-24: qty 100

## 2023-10-24 MED ORDER — LACTATED RINGERS IV SOLN: INTRAVENOUS | Status: DC

## 2023-10-24 NOTE — Progress Notes (Addendum)
eLink Physician-Brief Progress Note Patient Name: Erin [name] DOB: Dec 21, 1955 MRN: [id number]   Date of Service  10/24/2023  HPI/Events of Note  Notified of fever with temp 101.80F.  Pt is getting tylenol and has ice packs on.   Pt also with minimal urine output - 300cc of urine since arrival.  Pt is getting LR@100cc /hr.   eICU Interventions  Pt has elevated creatinine so would hold off on NSAIDs. Continue antibiotics.  Continue to monitor urine output.      Intervention Category Minor Interventions: Other:  Larinda Buttery 10/24/2023, 2:43 AM  6:32 AM Pt with increasing pressor requirements from 35mcg/min to 89mcg/min of levophed and BP at 71/42.   Pt has received more than 6L IVFs.   Plan> Add vasopressin.  Continue titrating levophed to keep MAP >65.

## 2023-10-24 NOTE — Progress Notes (Signed)
NAME:  Erin [name], MRN:  [id number], DOB:  [birth year], LOS: 1 ADMISSION DATE:  10/23/2023, CONSULTATION DATE:  10/23/23 REFERRING MD:  Lula Olszewski - [facility] ED, CHIEF COMPLAINT:  diarrhea    History of Present Illness:  67 yo F PMH anxiety depression DM fibromyalgia CKD s/p R BKA who presented to [facility] ICU w CC diarrhea 10/23/23 -- reportedly started day of presentation, but was preceded by 1d of n/v/abd pain. In ED 12/8, labs were remarkable for AKI w hyperK, associated metabolic acidosis, lactic acidosis, elevated LFTs and leukocytosis,.  She was given rocephin, temporizing measures for hyperK, and 2L IVF, then started on peripheral pressors and admitted to [facility] ICU for further management and care.   On arrival to [facility] she is on NE. Continues to endorse abd pain.  In med review, it looks like she is on ozempic, it is not clear when she started this and she cannot provide history about this.   Per husband, patient was well on 12/7. Daughter was hospitalized for similar syndrome 3 weeks ago. [place]   Pertinent  Medical History  CKD S/p R BKA DM Anxiety Depression Fibromyalgia HLD  Hypothyroidism Migraines   Significant Hospital Events: Including procedures, antibiotic start and stop dates in addition to other pertinent events   12/8 2L IVF started on pressors hyperK temporized and transferred to [facility]. CVC placed   Interim History / Subjective:  Febrile overnight. Poor urine output despite total >6L of fluid resuscitation.  Vasopressin added.   Objective   Blood pressure 99/63, pulse 95, temperature (!) 100.4 F (38 C), resp. rate 20, height 5\' 2"  (1.575 m), weight 83.1 kg, SpO2 97%.        Intake/Output Summary (Last 24 hours) at 10/24/2023 0743 Last data filed at 10/24/2023 0647 Gross per 24 hour  Intake 3475.36 ml  Output 400 ml  Net 3075.36 ml   Filed Weights   10/23/23 1529 10/24/23 0500  Weight: 99.8 kg 83.1 kg    Examination: General: looks flushed HENT: dry oral mucosae.  Lungs: even unlabored,   Cardiovascular: ext warm. HS normal   Abdomen: reducible midline hernia. Obese, soft  Extremities: R BKA, left leg shin ulcer without purulence, erythema or heat. Few shoddy popliteal nodes.  Poor skin turgor.  Neuro: Lethargic but follows commands GU: Foley in place, muddy colored urine.   Ancillary Tests Personally Reviewed:   Creatinine 3.44 Mild transaminitis Mild lactic acidosis with CO2 18 Markedly elevated PCT: 43.09 Mild leukocytosis 11.4 CXR shows at most bibasilar atelectasis.  Cultures are pending.  Hepatitis panel is negative.   Assessment & Plan:   Shock  mixed hypovolemic and septic suspect GI source w preceding n/v/d.  Depending on timing of starting ozempic, could be a contributing factor to symptoms.  Acute encephalopathy presumed septic metabolic Acute respiratory failure w hypoxia  Pulmonary HTN Tobacco use  AKI on CKD, unclear stage with hyperkalemia Hyperkalemia  Type 2 diabetes mellitus Transaminitis without elevation of cholestatic markers. Likely sepsis/shock related. LLE superficial venous ulcer POA - doesn't appear infected.    Plan:  - Continue current empiric antibiotics pending culture data - Titrate NE and VP to keep MAP>65.  - Clinically still dry, fluid bolus and continue IV fluids and monitor CVP.  - Recheck lactate and ScvO2 to ensure adequate tissue perfusion.  - Repeat echo as PAH may complicate hemodynamic management and no echo since 2018 - Continue to monitor  creatinine and avoid further renal insults. AKI will need to run its course. Creatinine is already improving.  No indications for dialysis at this point.  - US abdomen to assess for evidence of CKD and possible source of infection.  - Adequate glycemic control on current regimen.   Best Practice (right click and "Reselect all SmartList Selections" daily)   Diet/type: NPO DVT prophylaxis  prophylactic heparin  Pressure ulcer(s): pressure ulcer assessment deferred  GI prophylaxis: N/A Lines: Central line Foley:  Yes, and it is still needed Code Status:  full code -- by default  Last date of multidisciplinary goals of care discussion [husband updated at the bedside. ]  CRITICAL CARE Performed by: Lynnell Catalan   Total critical care time: 40 minutes  Critical care time was exclusive of separately billable procedures and treating other patients.  Critical care was necessary to treat or prevent imminent or life-threatening deterioration.  Critical care was time spent personally by me on the following activities: development of treatment plan with patient and/or surrogate as well as nursing, discussions with consultants, evaluation of patient's response to treatment, examination of patient, obtaining history from patient or surrogate, ordering and performing treatments and interventions, ordering and review of laboratory studies, ordering and review of radiographic studies, pulse oximetry, re-evaluation of patient's condition and participation in multidisciplinary rounds.  Lynnell Catalan, MD [facility] ICU Physician [facility] Critical Care  Pager: [phone] Mobile: [phone] After hours: [phone].

## 2023-10-24 NOTE — Progress Notes (Signed)
Two RTs attempted aline per MD order maximum amount of times.  Both unsuccessful.  MD aware.

## 2023-10-24 NOTE — Procedures (Signed)
Arterial Catheter Insertion Procedure Note  Erin [name]  [id number]  [birth year]  Date:10/24/23  Time:9:39 PM    Provider Performing: Charlott Holler    Procedure: Insertion of Arterial Line (56387) with US guidance (56433)   Indication(s) Blood pressure monitoring and/or need for frequent ABGs  Consent Risks of the procedure as well as the alternatives and risks of each were explained to the patient and/or caregiver.  Consent for the procedure was obtained and is signed in the bedside chart  Anesthesia None   Time Out Verified patient identification, verified procedure, site/side was marked, verified correct patient position, special equipment/implants available, medications/allergies/relevant history reviewed, required imaging and test results available.   Sterile Technique Maximal sterile technique including full sterile barrier drape, hand hygiene, sterile gown, sterile gloves, mask, hair covering, sterile ultrasound probe cover (if used).   Procedure Description Area of catheter insertion was cleaned with chlorhexidine and draped in sterile fashion. With real-time ultrasound guidance an arterial catheter was placed into the left femoral artery.  Appropriate arterial tracings confirmed on monitor.     Complications/Tolerance None; patient tolerated the procedure well.   EBL Minimal   Specimen(s) None  Durel Salts, MD Pulmonary and Critical Care Medicine [facility] 10/24/2023 9:39 PM Pager: see AMION  If no response to pager, please call critical care on call (see AMION) until 7pm After 7:00 pm call Elink

## 2023-10-24 NOTE — Progress Notes (Signed)
Mesenteric duplex study was attempted. Test was non-diagnostic due patient body habitus and poorly visualized aorta, SMA, and celiac arteries.  Marilynne Halsted, BS, RDMS, RVT

## 2023-10-24 NOTE — Progress Notes (Signed)
Echocardiogram 2D Echocardiogram has been performed.  Warren Lacy Estefana Taylor RDCS 10/24/2023, 10:53 AM

## 2023-10-25 ENCOUNTER — Inpatient Hospital Stay (HOSPITAL_COMMUNITY): Payer: Medicare Other

## 2023-10-25 ENCOUNTER — Other Ambulatory Visit (HOSPITAL_COMMUNITY): Payer: Self-pay

## 2023-10-25 ENCOUNTER — Encounter (HOSPITAL_COMMUNITY): Payer: Medicare Other

## 2023-10-25 DIAGNOSIS — R0989 Other specified symptoms and signs involving the circulatory and respiratory systems: Secondary | ICD-10-CM | POA: Diagnosis not present

## 2023-10-25 DIAGNOSIS — A419 Sepsis, unspecified organism: Secondary | ICD-10-CM | POA: Diagnosis not present

## 2023-10-25 DIAGNOSIS — J9602 Acute respiratory failure with hypercapnia: Secondary | ICD-10-CM

## 2023-10-25 DIAGNOSIS — R579 Shock, unspecified: Secondary | ICD-10-CM

## 2023-10-25 LAB — POCT I-STAT 7, (LYTES, BLD GAS, ICA,H+H)
Acid-Base Excess: 0 mmol/L (ref 0.0–2.0)
Acid-base deficit: 10 mmol/L — ABNORMAL HIGH (ref 0.0–2.0)
Acid-base deficit: 12 mmol/L — ABNORMAL HIGH (ref 0.0–2.0)
Acid-base deficit: 13 mmol/L — ABNORMAL HIGH (ref 0.0–2.0)
Acid-base deficit: 13 mmol/L — ABNORMAL HIGH (ref 0.0–2.0)
Acid-base deficit: 14 mmol/L — ABNORMAL HIGH (ref 0.0–2.0)
Acid-base deficit: 2 mmol/L (ref 0.0–2.0)
Acid-base deficit: 2 mmol/L (ref 0.0–2.0)
Acid-base deficit: 7 mmol/L — ABNORMAL HIGH (ref 0.0–2.0)
Acid-base deficit: 8 mmol/L — ABNORMAL HIGH (ref 0.0–2.0)
Acid-base deficit: 8 mmol/L — ABNORMAL HIGH (ref 0.0–2.0)
Acid-base deficit: 8 mmol/L — ABNORMAL HIGH (ref 0.0–2.0)
Acid-base deficit: 9 mmol/L — ABNORMAL HIGH (ref 0.0–2.0)
Bicarbonate: 12.6 mmol/L — ABNORMAL LOW (ref 20.0–28.0)
Bicarbonate: 17 mmol/L — ABNORMAL LOW (ref 20.0–28.0)
Bicarbonate: 18.6 mmol/L — ABNORMAL LOW (ref 20.0–28.0)
Bicarbonate: 18.9 mmol/L — ABNORMAL LOW (ref 20.0–28.0)
Bicarbonate: 20.2 mmol/L (ref 20.0–28.0)
Bicarbonate: 21.4 mmol/L (ref 20.0–28.0)
Bicarbonate: 22.6 mmol/L (ref 20.0–28.0)
Bicarbonate: 23 mmol/L (ref 20.0–28.0)
Bicarbonate: 24 mmol/L (ref 20.0–28.0)
Bicarbonate: 24 mmol/L (ref 20.0–28.0)
Bicarbonate: 28.2 mmol/L — ABNORMAL HIGH (ref 20.0–28.0)
Bicarbonate: 28.5 mmol/L — ABNORMAL HIGH (ref 20.0–28.0)
Bicarbonate: 30.8 mmol/L — ABNORMAL HIGH (ref 20.0–28.0)
Calcium, Ion: 0.83 mmol/L — CL (ref 1.15–1.40)
Calcium, Ion: 0.9 mmol/L — ABNORMAL LOW (ref 1.15–1.40)
Calcium, Ion: 0.9 mmol/L — ABNORMAL LOW (ref 1.15–1.40)
Calcium, Ion: 0.92 mmol/L — ABNORMAL LOW (ref 1.15–1.40)
Calcium, Ion: 0.94 mmol/L — ABNORMAL LOW (ref 1.15–1.40)
Calcium, Ion: 0.94 mmol/L — ABNORMAL LOW (ref 1.15–1.40)
Calcium, Ion: 0.95 mmol/L — ABNORMAL LOW (ref 1.15–1.40)
Calcium, Ion: 0.99 mmol/L — ABNORMAL LOW (ref 1.15–1.40)
Calcium, Ion: 1.01 mmol/L — ABNORMAL LOW (ref 1.15–1.40)
Calcium, Ion: 1.02 mmol/L — ABNORMAL LOW (ref 1.15–1.40)
Calcium, Ion: 1.05 mmol/L — ABNORMAL LOW (ref 1.15–1.40)
Calcium, Ion: 1.06 mmol/L — ABNORMAL LOW (ref 1.15–1.40)
Calcium, Ion: 1.3 mmol/L (ref 1.15–1.40)
HCT: 34 % — ABNORMAL LOW (ref 36.0–46.0)
HCT: 36 % (ref 36.0–46.0)
HCT: 36 % (ref 36.0–46.0)
HCT: 38 % (ref 36.0–46.0)
HCT: 42 % (ref 36.0–46.0)
HCT: 42 % (ref 36.0–46.0)
HCT: 43 % (ref 36.0–46.0)
HCT: 44 % (ref 36.0–46.0)
HCT: 44 % (ref 36.0–46.0)
HCT: 45 % (ref 36.0–46.0)
HCT: 45 % (ref 36.0–46.0)
HCT: 46 % (ref 36.0–46.0)
HCT: 46 % (ref 36.0–46.0)
Hemoglobin: 11.6 g/dL — ABNORMAL LOW (ref 12.0–15.0)
Hemoglobin: 12.2 g/dL (ref 12.0–15.0)
Hemoglobin: 12.2 g/dL (ref 12.0–15.0)
Hemoglobin: 12.9 g/dL (ref 12.0–15.0)
Hemoglobin: 14.3 g/dL (ref 12.0–15.0)
Hemoglobin: 14.3 g/dL (ref 12.0–15.0)
Hemoglobin: 14.6 g/dL (ref 12.0–15.0)
Hemoglobin: 15 g/dL (ref 12.0–15.0)
Hemoglobin: 15 g/dL (ref 12.0–15.0)
Hemoglobin: 15.3 g/dL — ABNORMAL HIGH (ref 12.0–15.0)
Hemoglobin: 15.3 g/dL — ABNORMAL HIGH (ref 12.0–15.0)
Hemoglobin: 15.6 g/dL — ABNORMAL HIGH (ref 12.0–15.0)
Hemoglobin: 15.6 g/dL — ABNORMAL HIGH (ref 12.0–15.0)
O2 Saturation: 83 %
O2 Saturation: 84 %
O2 Saturation: 86 %
O2 Saturation: 87 %
O2 Saturation: 87 %
O2 Saturation: 88 %
O2 Saturation: 88 %
O2 Saturation: 89 %
O2 Saturation: 89 %
O2 Saturation: 89 %
O2 Saturation: 92 %
O2 Saturation: 95 %
O2 Saturation: 96 %
Patient temperature: 37.2
Patient temperature: 37.7
Patient temperature: 39.3
Patient temperature: 39.9
Patient temperature: 96.8
Patient temperature: 97.9
Patient temperature: 98.4
Patient temperature: 98.4
Patient temperature: 99
Patient temperature: 99
Patient temperature: 99
Patient temperature: 99
Potassium: 5.2 mmol/L — ABNORMAL HIGH (ref 3.5–5.1)
Potassium: 5.4 mmol/L — ABNORMAL HIGH (ref 3.5–5.1)
Potassium: 5.5 mmol/L — ABNORMAL HIGH (ref 3.5–5.1)
Potassium: 5.6 mmol/L — ABNORMAL HIGH (ref 3.5–5.1)
Potassium: 5.7 mmol/L — ABNORMAL HIGH (ref 3.5–5.1)
Potassium: 6.1 mmol/L — ABNORMAL HIGH (ref 3.5–5.1)
Potassium: 6.1 mmol/L — ABNORMAL HIGH (ref 3.5–5.1)
Potassium: 6.1 mmol/L — ABNORMAL HIGH (ref 3.5–5.1)
Potassium: 6.2 mmol/L — ABNORMAL HIGH (ref 3.5–5.1)
Potassium: 6.4 mmol/L (ref 3.5–5.1)
Potassium: 6.4 mmol/L (ref 3.5–5.1)
Potassium: 6.5 mmol/L (ref 3.5–5.1)
Potassium: 7 mmol/L (ref 3.5–5.1)
Sodium: 132 mmol/L — ABNORMAL LOW (ref 135–145)
Sodium: 132 mmol/L — ABNORMAL LOW (ref 135–145)
Sodium: 132 mmol/L — ABNORMAL LOW (ref 135–145)
Sodium: 133 mmol/L — ABNORMAL LOW (ref 135–145)
Sodium: 133 mmol/L — ABNORMAL LOW (ref 135–145)
Sodium: 134 mmol/L — ABNORMAL LOW (ref 135–145)
Sodium: 134 mmol/L — ABNORMAL LOW (ref 135–145)
Sodium: 135 mmol/L (ref 135–145)
Sodium: 135 mmol/L (ref 135–145)
Sodium: 136 mmol/L (ref 135–145)
Sodium: 136 mmol/L (ref 135–145)
Sodium: 136 mmol/L (ref 135–145)
Sodium: 140 mmol/L (ref 135–145)
TCO2: 13 mmol/L — ABNORMAL LOW (ref 22–32)
TCO2: 19 mmol/L — ABNORMAL LOW (ref 22–32)
TCO2: 20 mmol/L — ABNORMAL LOW (ref 22–32)
TCO2: 21 mmol/L — ABNORMAL LOW (ref 22–32)
TCO2: 22 mmol/L (ref 22–32)
TCO2: 23 mmol/L (ref 22–32)
TCO2: 25 mmol/L (ref 22–32)
TCO2: 25 mmol/L (ref 22–32)
TCO2: 26 mmol/L (ref 22–32)
TCO2: 26 mmol/L (ref 22–32)
TCO2: 30 mmol/L (ref 22–32)
TCO2: 31 mmol/L (ref 22–32)
TCO2: 33 mmol/L — ABNORMAL HIGH (ref 22–32)
pCO2 arterial: 29.9 mm[Hg] — ABNORMAL LOW (ref 32–48)
pCO2 arterial: 58.2 mm[Hg] — ABNORMAL HIGH (ref 32–48)
pCO2 arterial: 61.2 mm[Hg] — ABNORMAL HIGH (ref 32–48)
pCO2 arterial: 63.6 mm[Hg] — ABNORMAL HIGH (ref 32–48)
pCO2 arterial: 64.3 mm[Hg] — ABNORMAL HIGH (ref 32–48)
pCO2 arterial: 65.4 mm[Hg] (ref 32–48)
pCO2 arterial: 68.5 mm[Hg] (ref 32–48)
pCO2 arterial: 73.3 mm[Hg] (ref 32–48)
pCO2 arterial: 75.3 mm[Hg] (ref 32–48)
pCO2 arterial: 81.2 mm[Hg] (ref 32–48)
pCO2 arterial: 83.2 mm[Hg] (ref 32–48)
pCO2 arterial: 84 mm[Hg] (ref 32–48)
pCO2 arterial: 84.8 mm[Hg] (ref 32–48)
pH, Arterial: 7.035 — CL (ref 7.35–7.45)
pH, Arterial: 7.062 — CL (ref 7.35–7.45)
pH, Arterial: 7.078 — CL (ref 7.35–7.45)
pH, Arterial: 7.08 — CL (ref 7.35–7.45)
pH, Arterial: 7.082 — CL (ref 7.35–7.45)
pH, Arterial: 7.098 — CL (ref 7.35–7.45)
pH, Arterial: 7.125 — CL (ref 7.35–7.45)
pH, Arterial: 7.145 — CL (ref 7.35–7.45)
pH, Arterial: 7.146 — CL (ref 7.35–7.45)
pH, Arterial: 7.154 — CL (ref 7.35–7.45)
pH, Arterial: 7.168 — CL (ref 7.35–7.45)
pH, Arterial: 7.193 — CL (ref 7.35–7.45)
pH, Arterial: 7.234 — ABNORMAL LOW (ref 7.35–7.45)
pO2, Arterial: 120 mm[Hg] — ABNORMAL HIGH (ref 83–108)
pO2, Arterial: 61 mm[Hg] — ABNORMAL LOW (ref 83–108)
pO2, Arterial: 66 mm[Hg] — ABNORMAL LOW (ref 83–108)
pO2, Arterial: 67 mm[Hg] — ABNORMAL LOW (ref 83–108)
pO2, Arterial: 69 mm[Hg] — ABNORMAL LOW (ref 83–108)
pO2, Arterial: 69 mm[Hg] — ABNORMAL LOW (ref 83–108)
pO2, Arterial: 73 mm[Hg] — ABNORMAL LOW (ref 83–108)
pO2, Arterial: 78 mm[Hg] — ABNORMAL LOW (ref 83–108)
pO2, Arterial: 81 mm[Hg] — ABNORMAL LOW (ref 83–108)
pO2, Arterial: 84 mm[Hg] (ref 83–108)
pO2, Arterial: 86 mm[Hg] (ref 83–108)
pO2, Arterial: 87 mm[Hg] (ref 83–108)
pO2, Arterial: 99 mm[Hg] (ref 83–108)

## 2023-10-25 LAB — COMPREHENSIVE METABOLIC PANEL
ALT: 215 U/L — ABNORMAL HIGH (ref 0–44)
ALT: 328 U/L — ABNORMAL HIGH (ref 0–44)
AST: 296 U/L — ABNORMAL HIGH (ref 15–41)
AST: 647 U/L — ABNORMAL HIGH (ref 15–41)
Albumin: <1.5 g/dL — ABNORMAL LOW (ref 3.5–5.0)
Albumin: <1.5 g/dL — ABNORMAL LOW (ref 3.5–5.0)
Alkaline Phosphatase: 133 U/L — ABNORMAL HIGH (ref 38–126)
Alkaline Phosphatase: 151 U/L — ABNORMAL HIGH (ref 38–126)
Anion gap: 16 — ABNORMAL HIGH (ref 5–15)
Anion gap: 18 — ABNORMAL HIGH (ref 5–15)
BUN: 81 mg/dL — ABNORMAL HIGH (ref 8–23)
BUN: 70 mg/dL — ABNORMAL HIGH (ref 8–23)
CO2: 18 mmol/L — ABNORMAL LOW (ref 22–32)
CO2: 20 mmol/L — ABNORMAL LOW (ref 22–32)
Calcium: 7.3 mg/dL — ABNORMAL LOW (ref 8.9–10.3)
Calcium: 7.5 mg/dL — ABNORMAL LOW (ref 8.9–10.3)
Chloride: 100 mmol/L (ref 98–111)
Chloride: 98 mmol/L (ref 98–111)
Creatinine, Ser: 4.07 mg/dL — ABNORMAL HIGH (ref 0.44–1.00)
Creatinine, Ser: 3.8 mg/dL — ABNORMAL HIGH (ref 0.44–1.00)
GFR, Estimated: 12 mL/min — ABNORMAL LOW (ref >60)
GFR, Estimated: 13 mL/min — ABNORMAL LOW (ref >60)
Glucose, Bld: 168 mg/dL — ABNORMAL HIGH (ref 70–99)
Glucose, Bld: 84 mg/dL (ref 70–99)
Potassium: 5.8 mmol/L — ABNORMAL HIGH (ref 3.5–5.1)
Potassium: 6.5 mmol/L (ref 3.5–5.1)
Sodium: 134 mmol/L — ABNORMAL LOW (ref 135–145)
Sodium: 136 mmol/L (ref 135–145)
Total Bilirubin: 1 mg/dL (ref <1.2)
Total Bilirubin: 1.1 mg/dL (ref <1.2)
Total Protein: 4.2 g/dL — ABNORMAL LOW (ref 6.5–8.1)
Total Protein: 4.4 g/dL — ABNORMAL LOW (ref 6.5–8.1)

## 2023-10-25 LAB — HEPATIC FUNCTION PANEL
ALT: 236 U/L — ABNORMAL HIGH (ref 0–44)
AST: 367 U/L — ABNORMAL HIGH (ref 15–41)
Albumin: <1.5 g/dL — ABNORMAL LOW (ref 3.5–5.0)
Alkaline Phosphatase: 136 U/L — ABNORMAL HIGH (ref 38–126)
Bilirubin, Direct: 0.4 mg/dL — ABNORMAL HIGH (ref 0.0–0.2)
Indirect Bilirubin: 0.4 mg/dL (ref 0.3–0.9)
Total Bilirubin: 0.8 mg/dL (ref <1.2)
Total Protein: 4.3 g/dL — ABNORMAL LOW (ref 6.5–8.1)

## 2023-10-25 LAB — POTASSIUM
Potassium: 5.9 mmol/L — ABNORMAL HIGH (ref 3.5–5.1)
Potassium: 5.9 mmol/L — ABNORMAL HIGH (ref 3.5–5.1)
Potassium: 6.2 mmol/L — ABNORMAL HIGH (ref 3.5–5.1)

## 2023-10-25 LAB — GLUCOSE, CAPILLARY
Glucose-Capillary: 94 mg/dL (ref 70–99)
Glucose-Capillary: 37 mg/dL — CL (ref 70–99)
Glucose-Capillary: 74 mg/dL (ref 70–99)
Glucose-Capillary: 61 mg/dL — ABNORMAL LOW (ref 70–99)
Glucose-Capillary: 69 mg/dL — ABNORMAL LOW (ref 70–99)
Glucose-Capillary: 141 mg/dL — ABNORMAL HIGH (ref 70–99)
Glucose-Capillary: 26 mg/dL — CL (ref 70–99)
Glucose-Capillary: 42 mg/dL — CL (ref 70–99)
Glucose-Capillary: 132 mg/dL — ABNORMAL HIGH (ref 70–99)
Glucose-Capillary: 92 mg/dL (ref 70–99)
Glucose-Capillary: 74 mg/dL (ref 70–99)

## 2023-10-25 LAB — BASIC METABOLIC PANEL
Anion gap: 19 — ABNORMAL HIGH (ref 5–15)
Anion gap: 22 — ABNORMAL HIGH (ref 5–15)
BUN: 81 mg/dL — ABNORMAL HIGH (ref 8–23)
BUN: 42 mg/dL — ABNORMAL HIGH (ref 8–23)
CO2: 12 mmol/L — ABNORMAL LOW (ref 22–32)
CO2: 21 mmol/L — ABNORMAL LOW (ref 22–32)
Calcium: 7 mg/dL — ABNORMAL LOW (ref 8.9–10.3)
Calcium: 6.6 mg/dL — ABNORMAL LOW (ref 8.9–10.3)
Chloride: 101 mmol/L (ref 98–111)
Chloride: 97 mmol/L — ABNORMAL LOW (ref 98–111)
Creatinine, Ser: 4.1 mg/dL — ABNORMAL HIGH (ref 0.44–1.00)
Creatinine, Ser: 2.66 mg/dL — ABNORMAL HIGH (ref 0.44–1.00)
GFR, Estimated: 11 mL/min — ABNORMAL LOW (ref >60)
GFR, Estimated: 19 mL/min — ABNORMAL LOW (ref >60)
Glucose, Bld: 95 mg/dL (ref 70–99)
Glucose, Bld: 74 mg/dL (ref 70–99)
Potassium: 6.6 mmol/L (ref 3.5–5.1)
Potassium: 6.2 mmol/L — ABNORMAL HIGH (ref 3.5–5.1)
Sodium: 132 mmol/L — ABNORMAL LOW (ref 135–145)
Sodium: 140 mmol/L (ref 135–145)

## 2023-10-25 LAB — RENAL FUNCTION PANEL
Albumin: <1.5 g/dL — ABNORMAL LOW (ref 3.5–5.0)
Anion gap: 20 — ABNORMAL HIGH (ref 5–15)
BUN: 53 mg/dL — ABNORMAL HIGH (ref 8–23)
CO2: 19 mmol/L — ABNORMAL LOW (ref 22–32)
Calcium: 7.5 mg/dL — ABNORMAL LOW (ref 8.9–10.3)
Chloride: 99 mmol/L (ref 98–111)
Creatinine, Ser: 3.05 mg/dL — ABNORMAL HIGH (ref 0.44–1.00)
GFR, Estimated: 16 mL/min — ABNORMAL LOW (ref >60)
Glucose, Bld: 138 mg/dL — ABNORMAL HIGH (ref 70–99)
Phosphorus: 9.4 mg/dL — ABNORMAL HIGH (ref 2.5–4.6)
Potassium: 5.5 mmol/L — ABNORMAL HIGH (ref 3.5–5.1)
Sodium: 138 mmol/L (ref 135–145)

## 2023-10-25 LAB — LACTIC ACID, PLASMA
Lactic Acid, Venous: 3.2 mmol/L (ref 0.5–1.9)
Lactic Acid, Venous: 3.3 mmol/L (ref 0.5–1.9)
Lactic Acid, Venous: 2.9 mmol/L (ref 0.5–1.9)
Lactic Acid, Venous: 5.6 mmol/L (ref 0.5–1.9)
Lactic Acid, Venous: 7.2 mmol/L (ref 0.5–1.9)
Lactic Acid, Venous: 8.7 mmol/L (ref 0.5–1.9)

## 2023-10-25 LAB — GASTROINTESTINAL PANEL BY PCR, STOOL (REPLACES STOOL CULTURE)

## 2023-10-25 LAB — C DIFFICILE QUICK SCREEN W PCR REFLEX
C Diff antigen: NEGATIVE
C Diff interpretation: NOT DETECTED
C Diff toxin: NEGATIVE

## 2023-10-25 LAB — COOXEMETRY PANEL
Carboxyhemoglobin: <0.3 % — ABNORMAL LOW (ref 0.5–1.5)
Methemoglobin: 2.8 % — ABNORMAL HIGH (ref 0.0–1.5)
O2 Saturation: 68.5 %
Total hemoglobin: 15.7 g/dL (ref 12.0–16.0)

## 2023-10-25 LAB — LIPASE, BLOOD: Lipase: 35 U/L (ref 11–51)

## 2023-10-25 LAB — CBC
HCT: 45.2 % (ref 36.0–46.0)
Hemoglobin: 14.5 g/dL (ref 12.0–15.0)
MCH: 30.7 pg (ref 26.0–34.0)
MCHC: 32.1 g/dL (ref 30.0–36.0)
MCV: 95.8 fL (ref 80.0–100.0)
Platelets: 171 10*3/uL (ref 150–400)
RBC: 4.72 MIL/uL (ref 3.87–5.11)
RDW: 14.7 % (ref 11.5–15.5)
WBC: 14.9 10*3/uL — ABNORMAL HIGH (ref 4.0–10.5)
nRBC: 0.7 % — ABNORMAL HIGH (ref 0.0–0.2)

## 2023-10-25 LAB — PHOSPHORUS: Phosphorus: 8.6 mg/dL — ABNORMAL HIGH (ref 2.5–4.6)

## 2023-10-25 LAB — AMMONIA: Ammonia: 110 umol/L — ABNORMAL HIGH (ref 9–35)

## 2023-10-25 LAB — MAGNESIUM
Magnesium: 2.5 mg/dL — ABNORMAL HIGH (ref 1.7–2.4)
Magnesium: 3 mg/dL — ABNORMAL HIGH (ref 1.7–2.4)

## 2023-10-25 LAB — TSH: TSH: 0.245 u[IU]/mL — ABNORMAL LOW (ref 0.350–4.500)

## 2023-10-25 LAB — PROCALCITONIN: Procalcitonin: 75.78 ng/mL

## 2023-10-25 LAB — HEPARIN LEVEL (UNFRACTIONATED): Heparin Unfractionated: 0.57 [IU]/mL (ref 0.30–0.70)

## 2023-10-25 MED ORDER — VASOPRESSIN 20 UNITS/100 ML INFUSION FOR SHOCK
0.0000 [IU]/min | INTRAVENOUS | Status: DC
  Administered 2023-10-25: 0.04 [IU]/min via INTRAVENOUS

## 2023-10-25 MED ORDER — NOREPINEPHRINE 16 MG/250ML-% IV SOLN
0.0000 ug/min | INTRAVENOUS | Status: DC
  Administered 2023-10-25: 35 ug/min via INTRAVENOUS
  Administered 2023-10-25: 40 ug/min via INTRAVENOUS
  Administered 2023-10-25: 25 ug/min via INTRAVENOUS
  Filled 2023-10-25 (×3): qty 250

## 2023-10-25 MED ORDER — EPINEPHRINE HCL 5 MG/250ML IV SOLN IN NS
0.5000 ug/min | INTRAVENOUS | Status: DC
  Administered 2023-10-25: 0.5 ug/min via INTRAVENOUS
  Filled 2023-10-25: qty 250

## 2023-10-25 MED ORDER — FENTANYL BOLUS VIA INFUSION: 50.0000 ug | INTRAVENOUS | Status: DC | PRN

## 2023-10-25 MED ORDER — FENTANYL CITRATE PF 50 MCG/ML IJ SOSY
PREFILLED_SYRINGE | INTRAMUSCULAR | Status: AC
  Administered 2023-10-25: 50 ug via INTRAVENOUS
  Filled 2023-10-25: qty 2

## 2023-10-25 MED ORDER — HALOPERIDOL LACTATE 5 MG/ML IJ SOLN
0.5000 mg | INTRAMUSCULAR | Status: DC | PRN
  Filled 2023-10-25: qty 1

## 2023-10-25 MED ORDER — PRISMASOL BGK 4/2.5 32-4-2.5 MEQ/L REPLACEMENT SOLN
Status: DC
  Filled 2023-10-25 (×3): qty 5000

## 2023-10-25 MED ORDER — ACETAMINOPHEN 325 MG PO TABS
650.0000 mg | ORAL_TABLET | ORAL | Status: DC | PRN
  Administered 2023-10-25: 650 mg
  Filled 2023-10-25 (×2): qty 2

## 2023-10-25 MED ORDER — RENA-VITE PO TABS
1.0000 | ORAL_TABLET | Freq: Every day | ORAL | Status: DC
  Filled 2023-10-25: qty 1

## 2023-10-25 MED ORDER — ORAL CARE MOUTH RINSE: 15.0000 mL | OROMUCOSAL | Status: DC | PRN

## 2023-10-25 MED ORDER — SODIUM BICARBONATE 8.4 % IV SOLN
INTRAVENOUS | Status: AC
  Filled 2023-10-25: qty 50

## 2023-10-25 MED ORDER — GLYCOPYRROLATE 0.2 MG/ML IJ SOLN: 0.2000 mg | INTRAMUSCULAR | Status: DC | PRN

## 2023-10-25 MED ORDER — ETOMIDATE 2 MG/ML IV SOLN: 20.0000 mg | Freq: Once | INTRAVENOUS | Status: AC

## 2023-10-25 MED ORDER — SODIUM BICARBONATE 8.4 % IV SOLN
100.0000 meq | Freq: Once | INTRAVENOUS | Status: AC
  Administered 2023-10-25: 100 meq via INTRAVENOUS

## 2023-10-25 MED ORDER — CALCIUM GLUCONATE-NACL 2-0.675 GM/100ML-% IV SOLN
2.0000 g | Freq: Once | INTRAVENOUS | Status: DC
  Filled 2023-10-25: qty 100

## 2023-10-25 MED ORDER — SODIUM ZIRCONIUM CYCLOSILICATE 10 G PO PACK
10.0000 g | PACK | Freq: Once | ORAL | Status: AC
  Administered 2023-10-25: 10 g
  Filled 2023-10-25: qty 1

## 2023-10-25 MED ORDER — SODIUM BICARBONATE 8.4 % IV SOLN
INTRAVENOUS | Status: AC
  Administered 2023-10-25: 50 meq
  Filled 2023-10-25: qty 100

## 2023-10-25 MED ORDER — HYDROCORTISONE SOD SUC (PF) 100 MG IJ SOLR
100.0000 mg | Freq: Two times a day (BID) | INTRAMUSCULAR | Status: DC
  Administered 2023-10-25 (×3): 100 mg via INTRAVENOUS
  Filled 2023-10-25 (×3): qty 2

## 2023-10-25 MED ORDER — NOREPINEPHRINE 16 MG/250ML-% IV SOLN
0.0000 ug/min | INTRAVENOUS | Status: DC
  Administered 2023-10-25: 40 ug/min via INTRAVENOUS

## 2023-10-25 MED ORDER — HEPARIN (PORCINE) 25000 UT/250ML-% IV SOLN
1100.0000 [IU]/h | INTRAVENOUS | Status: DC
  Administered 2023-10-25: 1100 [IU]/h via INTRAVENOUS
  Filled 2023-10-25: qty 250

## 2023-10-25 MED ORDER — DEXTROSE 50 % IV SOLN
50.0000 mL | Freq: Once | INTRAVENOUS | Status: AC
Start: 2023-10-25 — End: 2023-10-25

## 2023-10-25 MED ORDER — FENTANYL 2500MCG IN NS 250ML (10MCG/ML) PREMIX INFUSION
50.0000 ug/h | INTRAVENOUS | Status: DC
  Administered 2023-10-26: 50 ug/h via INTRAVENOUS
  Filled 2023-10-25: qty 250

## 2023-10-25 MED ORDER — FENTANYL BOLUS VIA INFUSION
50.0000 ug | INTRAVENOUS | Status: DC | PRN
  Administered 2023-10-25 (×4): 50 ug via INTRAVENOUS

## 2023-10-25 MED ORDER — PRISMASOL BGK 0/2.5 32-2.5 MEQ/L EC SOLN
Status: DC
  Filled 2023-10-25 (×14): qty 5000

## 2023-10-25 MED ORDER — SODIUM BICARBONATE 8.4 % IV SOLN
INTRAVENOUS | Status: DC
  Filled 2023-10-25: qty 150
  Filled 2023-10-25: qty 1000

## 2023-10-25 MED ORDER — CALCIUM GLUCONATE-NACL 2-0.675 GM/100ML-% IV SOLN
2.0000 g | Freq: Once | INTRAVENOUS | Status: AC
  Administered 2023-10-25: 2000 mg via INTRAVENOUS
  Filled 2023-10-25: qty 100

## 2023-10-25 MED ORDER — MIDAZOLAM HCL 2 MG/2ML IJ SOLN
2.0000 mg | INTRAMUSCULAR | Status: DC | PRN
Start: 2023-10-25 — End: 2023-10-25
  Administered 2023-10-25 (×3): 2 mg via INTRAVENOUS
  Filled 2023-10-25 (×4): qty 2

## 2023-10-25 MED ORDER — ROCURONIUM BROMIDE 10 MG/ML (PF) SYRINGE
PREFILLED_SYRINGE | INTRAVENOUS | Status: AC
  Administered 2023-10-25: 80 mg via INTRAVENOUS
  Filled 2023-10-25: qty 10

## 2023-10-25 MED ORDER — NOREPINEPHRINE 4 MG/250ML-% IV SOLN
INTRAVENOUS | Status: AC
  Filled 2023-10-25: qty 250

## 2023-10-25 MED ORDER — DEXTROSE IN LACTATED RINGERS 5 % IV SOLN: INTRAVENOUS | Status: DC

## 2023-10-25 MED ORDER — LORAZEPAM 2 MG/ML PO CONC: 1.0000 mg | ORAL | Status: DC | PRN

## 2023-10-25 MED ORDER — PRISMASOL BGK 0/2.5 32-2.5 MEQ/L EC SOLN
Status: DC
  Filled 2023-10-25 (×2): qty 5000

## 2023-10-25 MED ORDER — FENTANYL CITRATE PF 50 MCG/ML IJ SOSY: 50.0000 ug | PREFILLED_SYRINGE | Freq: Once | INTRAMUSCULAR | Status: AC

## 2023-10-25 MED ORDER — FENTANYL BOLUS VIA INFUSION: 25.0000 ug | INTRAVENOUS | Status: DC | PRN

## 2023-10-25 MED ORDER — FUROSEMIDE 10 MG/ML IJ SOLN
40.0000 mg | Freq: Once | INTRAMUSCULAR | Status: AC
  Administered 2023-10-25: 40 mg via INTRAVENOUS
  Filled 2023-10-25: qty 4

## 2023-10-25 MED ORDER — HEPARIN SODIUM (PORCINE) 1000 UNIT/ML IJ SOLN
1000.0000 [IU] | INTRAMUSCULAR | Status: DC | PRN
  Filled 2023-10-25: qty 2

## 2023-10-25 MED ORDER — LORAZEPAM 2 MG/ML IJ SOLN
1.0000 mg | INTRAMUSCULAR | Status: DC | PRN
  Administered 2023-10-26: 2 mg via INTRAVENOUS
  Filled 2023-10-25: qty 1

## 2023-10-25 MED ORDER — DEXTROSE 50 % IV SOLN
INTRAVENOUS | Status: AC
  Administered 2023-10-25: 12.5 g via INTRAVENOUS
  Filled 2023-10-25: qty 50

## 2023-10-25 MED ORDER — PIPERACILLIN-TAZOBACTAM 3.375 G IVPB 30 MIN
3.3750 g | Freq: Four times a day (QID) | INTRAVENOUS | Status: DC
  Administered 2023-10-25 (×2): 3.375 g via INTRAVENOUS
  Filled 2023-10-25 (×6): qty 50

## 2023-10-25 MED ORDER — HALOPERIDOL 0.5 MG PO TABS: 0.5000 mg | ORAL_TABLET | ORAL | Status: DC | PRN

## 2023-10-25 MED ORDER — DEXTROSE 50 % IV SOLN
12.5000 g | INTRAVENOUS | Status: AC
Start: 2023-10-25 — End: 2023-10-25
  Administered 2023-10-25: 12.5 g via INTRAVENOUS

## 2023-10-25 MED ORDER — CALCIUM GLUCONATE-NACL 2-0.675 GM/100ML-% IV SOLN
2.0000 g | Freq: Once | INTRAVENOUS | Status: AC
Start: 2023-10-25 — End: 2023-10-25

## 2023-10-25 MED ORDER — FAMOTIDINE 20 MG PO TABS
10.0000 mg | ORAL_TABLET | Freq: Every day | ORAL | Status: DC
  Administered 2023-10-25: 10 mg

## 2023-10-25 MED ORDER — DEXTROSE 50 % IV SOLN: 25.0000 g | INTRAVENOUS | Status: AC

## 2023-10-25 MED ORDER — INSULIN ASPART 100 UNIT/ML IV SOLN
10.0000 [IU] | Freq: Once | INTRAVENOUS | Status: AC
Start: 2023-10-25 — End: 2023-10-25
  Administered 2023-10-25: 10 [IU] via INTRAVENOUS

## 2023-10-25 MED ORDER — SODIUM BICARBONATE 8.4 % IV SOLN
50.0000 meq | INTRAVENOUS | Status: AC
  Administered 2023-10-25 (×2): 50 meq via INTRAVENOUS
  Filled 2023-10-25: qty 100

## 2023-10-25 MED ORDER — HALOPERIDOL LACTATE 2 MG/ML PO CONC: 0.5000 mg | ORAL | Status: DC | PRN

## 2023-10-25 MED ORDER — POLYVINYL ALCOHOL 1.4 % OP SOLN: 1.0000 [drp] | Freq: Four times a day (QID) | OPHTHALMIC | Status: DC | PRN

## 2023-10-25 MED ORDER — SODIUM ZIRCONIUM CYCLOSILICATE 10 G PO PACK
10.0000 g | PACK | Freq: Three times a day (TID) | ORAL | Status: DC
  Administered 2023-10-25: 10 g

## 2023-10-25 MED ORDER — FENTANYL 2500MCG IN NS 250ML (10MCG/ML) PREMIX INFUSION: 0.0000 ug/h | INTRAVENOUS | Status: DC

## 2023-10-25 MED ORDER — SODIUM BICARBONATE 8.4 % IV SOLN
INTRAVENOUS | Status: AC
  Administered 2023-10-25: 50 meq via INTRAVENOUS
  Filled 2023-10-25: qty 50

## 2023-10-25 MED ORDER — DEXTROSE 50 % IV SOLN
INTRAVENOUS | Status: AC
  Administered 2023-10-25: 50 mL via INTRAVENOUS
  Filled 2023-10-25: qty 50

## 2023-10-25 MED ORDER — SODIUM BICARBONATE 8.4 % IV SOLN: 100.0000 meq | Freq: Once | INTRAVENOUS | Status: AC

## 2023-10-25 MED ORDER — HEPARIN SODIUM (PORCINE) 1000 UNIT/ML DIALYSIS
1000.0000 [IU] | INTRAMUSCULAR | Status: DC | PRN
  Administered 2023-10-25: 2800 [IU] via INTRAVENOUS_CENTRAL
  Filled 2023-10-25: qty 6

## 2023-10-25 MED ORDER — FENTANYL 2500MCG IN NS 250ML (10MCG/ML) PREMIX INFUSION
INTRAVENOUS | Status: AC
  Administered 2023-10-25: 50 ug/h via INTRAVENOUS
  Filled 2023-10-25: qty 250

## 2023-10-25 MED ORDER — ATROPINE SULFATE 1 MG/10ML IJ SOSY
PREFILLED_SYRINGE | INTRAMUSCULAR | Status: AC
  Administered 2023-10-25: 1 mg
  Filled 2023-10-25: qty 10

## 2023-10-25 MED ORDER — ETOMIDATE 2 MG/ML IV SOLN
INTRAVENOUS | Status: AC
  Administered 2023-10-25: 20 mg via INTRAVENOUS
  Filled 2023-10-25: qty 20

## 2023-10-25 MED ORDER — KETAMINE HCL 50 MG/5ML IJ SOSY
PREFILLED_SYRINGE | INTRAMUSCULAR | Status: AC
  Filled 2023-10-25: qty 10

## 2023-10-25 MED ORDER — BIOTENE DRY MOUTH MT LIQD: 15.0000 mL | OROMUCOSAL | Status: DC | PRN

## 2023-10-25 MED ORDER — FAMOTIDINE 20 MG PO TABS
20.0000 mg | ORAL_TABLET | Freq: Two times a day (BID) | ORAL | Status: DC
  Filled 2023-10-25: qty 1

## 2023-10-25 MED ORDER — SUCCINYLCHOLINE CHLORIDE 200 MG/10ML IV SOSY
PREFILLED_SYRINGE | INTRAVENOUS | Status: AC
  Filled 2023-10-25: qty 10

## 2023-10-25 MED ORDER — PRISMASOL BGK 0/2.5 32-2.5 MEQ/L EC SOLN
Status: DC
  Filled 2023-10-25 (×3): qty 5000

## 2023-10-25 MED ORDER — FENTANYL BOLUS VIA INFUSION: 20.0000 ug | INTRAVENOUS | Status: DC | PRN

## 2023-10-25 MED ORDER — DEXTROSE 50 % IV SOLN
INTRAVENOUS | Status: AC
  Administered 2023-10-25: 25 g via INTRAVENOUS
  Filled 2023-10-25: qty 50

## 2023-10-25 MED ORDER — GLYCOPYRROLATE 1 MG PO TABS: 1.0000 mg | ORAL_TABLET | ORAL | Status: DC | PRN

## 2023-10-25 MED ORDER — ACETAMINOPHEN 325 MG PO TABS: 650.0000 mg | ORAL_TABLET | Freq: Four times a day (QID) | ORAL | Status: DC | PRN

## 2023-10-25 MED ORDER — MIDAZOLAM HCL 2 MG/2ML IJ SOLN
INTRAMUSCULAR | Status: AC
  Filled 2023-10-25: qty 2

## 2023-10-25 MED ORDER — AMIODARONE HCL IN DEXTROSE 360-4.14 MG/200ML-% IV SOLN
60.0000 mg/h | INTRAVENOUS | Status: DC
  Administered 2023-10-25: 60 mg/h via INTRAVENOUS
  Filled 2023-10-25: qty 200

## 2023-10-25 MED ORDER — LORAZEPAM 1 MG PO TABS: 1.0000 mg | ORAL_TABLET | ORAL | Status: DC | PRN

## 2023-10-25 MED ORDER — DEXTROSE 50 % IV SOLN
12.5000 g | INTRAVENOUS | Status: AC
Start: 2023-10-25 — End: 2023-10-25

## 2023-10-25 MED ORDER — CALCIUM GLUCONATE-NACL 2-0.675 GM/100ML-% IV SOLN
INTRAVENOUS | Status: AC
  Administered 2023-10-25: 2000 mg via INTRAVENOUS
  Filled 2023-10-25: qty 100

## 2023-10-25 MED ORDER — HEPARIN SODIUM (PORCINE) 1000 UNIT/ML IJ SOLN
INTRAMUSCULAR | Status: AC
  Administered 2023-10-25: 2800 [IU]
  Filled 2023-10-25: qty 1

## 2023-10-25 MED ORDER — SODIUM BICARBONATE 8.4 % IV SOLN: 50.0000 meq | Freq: Once | INTRAVENOUS | Status: AC

## 2023-10-25 MED ORDER — SODIUM BICARBONATE 8.4 % IV SOLN
50.0000 meq | Freq: Once | INTRAVENOUS | Status: AC
Start: 2023-10-25 — End: 2023-10-25
  Administered 2023-10-25: 50 meq via INTRAVENOUS

## 2023-10-25 MED ORDER — SODIUM BICARBONATE 8.4 % IV SOLN
INTRAVENOUS | Status: DC
  Filled 2023-10-25: qty 1000

## 2023-10-25 MED ORDER — ACETAMINOPHEN 650 MG RE SUPP: 650.0000 mg | Freq: Four times a day (QID) | RECTAL | Status: DC | PRN

## 2023-10-25 MED ORDER — SODIUM BICARBONATE 8.4 % IV SOLN
INTRAVENOUS | Status: AC
  Administered 2023-10-25: 100 meq via INTRAVENOUS
  Filled 2023-10-25: qty 100

## 2023-10-25 MED ORDER — ROCURONIUM BROMIDE 10 MG/ML (PF) SYRINGE: 80.0000 mg | PREFILLED_SYRINGE | Freq: Once | INTRAVENOUS | Status: AC

## 2023-10-25 MED ORDER — ORAL CARE MOUTH RINSE
15.0000 mL | OROMUCOSAL | Status: DC
  Administered 2023-10-25 (×2): 15 mL via OROMUCOSAL

## 2023-10-25 MED ORDER — AMIODARONE HCL IN DEXTROSE 360-4.14 MG/200ML-% IV SOLN: 30.0000 mg/h | INTRAVENOUS | Status: DC

## 2023-10-25 MED ORDER — AMIODARONE LOAD VIA INFUSION
150.0000 mg | Freq: Once | INTRAVENOUS | Status: AC
  Administered 2023-10-25: 150 mg via INTRAVENOUS
  Filled 2023-10-25: qty 83.34

## 2023-10-25 MED ORDER — INSULIN ASPART 100 UNIT/ML IJ SOLN: 0.0000 [IU] | Freq: Three times a day (TID) | INTRAMUSCULAR | Status: DC

## 2023-10-25 NOTE — Progress Notes (Signed)
PHARMACY - ANTICOAGULATION CONSULT NOTE  Pharmacy Consult for heparin  Indication: atrial fibrillation  No Known Allergies  Patient Measurements: Height: 5\' 2"  (157.5 cm) Weight: 83.1 kg (183 lb 3.2 oz) IBW/kg (Calculated) : 50.1 Heparin Dosing Weight: 73.8kg   Vital Signs: Temp: 98.2 F (36.8 C) (12/10 1900) BP: 118/33 (12/10 1144) Pulse Rate: 67 (12/10 1932)  Labs: Recent Labs    10/23/23 1552 10/23/23 1620 10/23/23 2128 10/24/23 0534 10/24/23 1335 10/25/23 0240 10/25/23 0251 10/25/23 0440 10/25/23 0614 10/25/23 1208 10/25/23 1347 10/25/23 1619 10/25/23 1637 10/25/23 1824 10/25/23 2006 10/25/23 2007  HGB  --    < > 14.5 14.0   < >  --    < > 14.5   < >  --    < >  --  15.3* 15.0 12.2  --   HCT  --    < > 45.3 42.9   < >  --    < > 45.2   < >  --    < >  --  45.0 44.0 36.0  --   PLT  --   --  291 198  --   --   --  171  --   --   --   --   --   --   --   --   APTT 24  --   --   --   --   --   --   --   --   --   --   --   --   --   --   --   LABPROT 15.6*  --  16.7*  --   --   --   --   --   --   --   --   --   --   --   --   --   INR 1.2  --  1.3*  --   --   --   --   --   --   --   --   --   --   --   --   --   HEPARINUNFRC  --   --   --   --   --   --   --   --   --   --   --   --   --   --   --  0.57  CREATININE  --    < > 3.44* 3.69*   < > 4.07*  --   --   --  3.80*  --  3.05*  --   --   --   --    < > = values in this interval not displayed.    Estimated Creatinine Clearance: 18.4 mL/min (A) (by C-G formula based on SCr of 3.05 mg/dL (H)).   Medical History: Past Medical History:  Diagnosis Date   Arthritis    Osteoarthritis right calcaneous   Carpal tunnel syndrome    Charcot ankle, right    Chronic kidney disease    patient unaware- does not see a nephrologist   Chronic pain    Complication of anesthesia    Depression    Diabetes mellitus    Tyoe II   Diabetic neuropathy (HCC)    Fibromyalgia    GERD (gastroesophageal reflux disease)     Headache    Hernia    History of blood transfusion    after miscarriage   Hyperlipemia  Hypertension    Miscarriage    twins   Peripheral neuropathy    PONV (postoperative nausea and vomiting)    no problem with medication   TMJ syndrome    Assessment: Patient admitted with septic shock suspected secondary to an intra-abdominal source. Patient requiring high dose vasopressors subsequently developing Afib. Pharmacy consulted to start heparin gtt. HgB 14.3 and plts 171.   Patient not on anticoagulation PTA. Received heparin subcutaneous at 05:00   PM update: first heparin level 0.57 (therapeutic) on heparin 1100 units/hr. Hgb stable 12.2. No issues with the infusion or bleeding reported.  Goal of Therapy:  Heparin level 0.3-0.7 units/ml Monitor platelets by anticoagulation protocol: Yes   Plan:  Continue heparin infusion at 1100 units/hr Check confirmatory heparin level with AM labs Check heparin level and CBC daily while on heparin   Loralee Pacas, PharmD, BCPS 10/25/2023,8:46 PM  Please check AMION for all [facility] Pharmacy phone numbers After 10:00 PM, call Main Pharmacy [phone]

## 2023-10-25 NOTE — Progress Notes (Addendum)
NAME:  Erin [name], MRN:  [id number], DOB:  09/30/1956, LOS: 2 ADMISSION DATE:  10/23/2023, CONSULTATION DATE:  10/23/23 REFERRING MD:  Lula Olszewski - [facility] ED, CHIEF COMPLAINT:  diarrhea    History of Present Illness:  67 yo F PMH anxiety depression DM fibromyalgia CKD s/p R BKA who presented to [facility] ICU w CC diarrhea 10/23/23 -- reportedly started day of presentation, but was preceded by 1d of n/v/abd pain. In ED 12/8, labs were remarkable for AKI w hyperK, associated metabolic acidosis, lactic acidosis, elevated LFTs and leukocytosis,.  She was given rocephin, temporizing measures for hyperK, and 2L IVF, then started on peripheral pressors and admitted to [facility] ICU for further management and care.   On arrival to [facility] she is on NE. Continues to endorse abd pain.  In med review, it looks like she is on ozempic, it is not clear when she started this and she cannot provide history about this.   Per husband, patient was well on 12/7. Daughter was hospitalized for similar syndrome 3 weeks ago. [place].   Pertinent  Medical History  CKD S/p R BKA DM Anxiety Depression Fibromyalgia HLD  Hypothyroidism Migraines   Significant Hospital Events: Including procedures, antibiotic start and stop dates in addition to other pertinent events   12/8 2L IVF started on pressors hyperK temporized and transferred to [facility]. CVC placed ] 12/10 patient intubated   Interim History / Subjective:  Overnight events: hypoxia, confusion, hyperkalemia requiring emergent intubation   Objective   Blood pressure (!) 82/64, pulse (!) 115, temperature (!) 103.8 F (39.9 C), resp. rate (!) 28, height 5\' 2"  (1.575 m), weight 83.1 kg, SpO2 93%. CVP:  [3 mmHg-14 mmHg] 7 mmHg  Vent Mode: PRVC FiO2 (%):  [100 %] 100 % Set Rate:  [22 bmp-28 bmp] 28 bmp Vt Set:  [400 mL] 400 mL PEEP:  [12 cmH20] 12 cmH20 Plateau Pressure:  [22 cmH20] 22 cmH20   Intake/Output Summary  (Last 24 hours) at 10/25/2023 1123 Last data filed at 10/25/2023 1100 Gross per 24 hour  Intake 3276.3 ml  Output 125 ml  Net 3151.3 ml   Filed Weights   10/23/23 1529 10/24/23 0500  Weight: 99.8 kg 83.1 kg   Examination: Physical Exam: General: Ill appearing laying in bed Cardiac:regular rate and rhythm  Pulmonary:clear to auscultate bilaterally, intubated and on ventilator  Abdominal: distended, bowel sounds distant Neuro:does not respond to painful stimuli, pupils are dilated and sluggish  MSK: Right BKA, no pitting edema present on LLE Skin: warm and dry  Psych:  unable to address  Ancillary Tests Personally Reviewed:       Latest Ref Rng & Units 10/25/2023    9:43 AM 10/25/2023    8:53 AM 10/25/2023    6:14 AM  CBC  Hemoglobin 12.0 - 15.0 g/dL 62.9  52.8  41.3   Hematocrit 36.0 - 46.0 % 44.0  42.0  45.0        Latest Ref Rng & Units 10/25/2023    9:43 AM 10/25/2023    8:53 AM 10/25/2023    6:14 AM  BMP  Sodium 135 - 145 mmol/L 135  134  132   Potassium 3.5 - 5.1 mmol/L 7.0  6.5  6.1     ABG    Component Value Date/Time   PHART 7.154 (LL) 10/25/2023 0943   PCO2ART 84.0 (HH) 10/25/2023 0943   PO2ART 87 10/25/2023 0943  HCO3 28.5 (H) 10/25/2023 0943   TCO2 31 10/25/2023 0943   ACIDBASEDEF 2.0 10/25/2023 0943   O2SAT 89 10/25/2023 0943    Blood Culture: NGTD  Echocardiogram: LVEF 60-65%, RV systolic function is normal   EPEC detected  Assessment & Plan:   Shock mixed hypovolemic and septic suspect GI source w preceding n/v/d.   Acute encephalopathy presumed septic metabolic Acute respiratory failure w hypoxia requiring mechanical ventilation  Pulmonary HTN Tobacco use  AKI on CKD, unclear stage with hyperkalemia, CRRT Hyperkalemia  Type 2 diabetes mellitus Transaminitis without elevation of cholestatic markers. Likely sepsis/shock related. LLE superficial venous ulcer POA - doesn't appear infected.    Plan: Patient decompensated over night,  requiring: intubation, HD cath placement for CRRT for severe acidemia  -Follow up on STAT abdominal CT scan to assess for source of infection  -Increased RR for respiratory acidosis -Continue temporizing measures for hyperkalemia until CRRT is started this morning  -Repeat ABG after CRRT begins  - Continue current empiric antibiotics pending culture data, zosyn  - Titrate NE and VP to keep MAP>65.  - Adequate glycemic control on current regimen.  -Stress dose steroids   Best Practice (right click and "Reselect all SmartList Selections" daily)   Diet/type: NPO DVT prophylaxis prophylactic heparin  Pressure ulcer(s): pressure ulcer assessment deferred  GI prophylaxis: N/A Lines: Central line Foley:  Yes, and it is still needed Code Status:  full code -- by default  Last date of multidisciplinary goals of care discussion [husband updated at the bedside on 12/10

## 2023-10-25 NOTE — Progress Notes (Signed)
BLE venous duplex has been completed.   Results can be found under chart review under CV PROC. 10/25/2023 4:02 PM Thanh Mottern RVT, RDMS

## 2023-10-25 NOTE — Consult Note (Signed)
[facility] Renal Consultation Note  Requesting MD: Lynnell Catalan, MD Indication for Consultation:  aki, hyperkalemia  Chief complaint: altered mental status, n/v/d  HPI:  Erin [name] is a 67 y.o. female with a history of type 2 diabetes mellitus, hypertension, fibromyalgia, arthritis, and chronic kidney disease who presented to the hospital with altered mentals status.  The patient's husband is at bedside and he is interviewed; she is intubated/obtunded and not able to provide any history.  He indicates that on 12/6, the patient complained of constipation and abdominal discomfort.  The following day, she had nausea with vomiting and diarrhea.  He states that on 12/8 the patient was less responsive and had generalized weakness; the family called EMS who brought her to [facility] ER.  She was felt to be quite dry/volume depleted on initial evaluation per charting. She was treated for septic shock with fluids and pressors and was [facility].  Per charting she was also seen by urgent care one week before and given prednisone and azithromycin for bronchitis.   Her husband states that she does use NSAID's at home.  She is also on benazepril-hydrochlorothiazide and farxiga as well as semaglutide.  She has also been on metformin.  Her hyperkalemia was medically temporized overnight. Nephrology is consulted acute renal failure and hyperkalemia as well as acidemia.  She had a nontunneled left internal jugular dialysis catheter placed this morning by critical care.  Levophed has been at 24 mcg/min and vasopressin is at 0.03 units/min.  She had 125 mL UOP over 12/9 as well as one unmeasured urine void.  She was intubated earlier this morning.  Team is concerned re: tachycardia.  Her husband and I discussed the risks, benefits, and indications for renal replacement therapy and he does consent for renal replacement therapy.  He states that she is "not doing too good as it is" and he wants to  do whatever can help her.  Per nursing, goal had been to take her down for a CT abdomen however she was too unstable.  Note also per charting that her daughter was hospitalized with a similar clinical picture about three weeks ago.    See labs below: More recently in Care Everywhere with Cr 1.24-1.29 from 12/2022 and 01/2023  Creatinine, Ser  Date/Time Value Ref Range Status  10/25/2023 02:40 AM 4.07 (H) 0.44 - 1.00 mg/dL Final  78/46/9629 52:84 AM 4.10 (H) 0.44 - 1.00 mg/dL Final  13/24/4010 27:25 PM 3.45 (H) 0.44 - 1.00 mg/dL Final  36/64/4034 74:25 AM 3.69 (H) 0.44 - 1.00 mg/dL Final  95/63/8756 43:32 PM 3.44 (H) 0.44 - 1.00 mg/dL Final  95/18/8416 60:63 PM 3.90 (H) 0.44 - 1.00 mg/dL Final  01/60/1093 23:55 PM 4.07 (H) 0.44 - 1.00 mg/dL Final  73/22/0254 27:06 PM 1.69 (H) 0.44 - 1.00 mg/dL Final  23/76/2831 51:76 AM 1.75 (H) 0.44 - 1.00 mg/dL Final  16/05/3709 62:69 AM 1.90 (H) 0.44 - 1.00 mg/dL Final  48/54/6270 35:00 PM 1.99 (H) 0.40 - 1.20 mg/dL Final  93/81/8299 37:16 AM 1.13 (H) 0.44 - 1.00 mg/dL Final  96/78/9381 01:75 AM 1.50 (H) 0.44 - 1.00 mg/dL Final  09/08/8526 78:24 PM 1.17 0.40 - 1.20 mg/dL Final  23/53/6144 31:54 AM 1.17 (H) 0.44 - 1.00 mg/dL Final  00/86/7619 50:93 PM 1.39 (H) 0.44 - 1.00 mg/dL Final  26/71/2458 09:98 PM 0.80 0.50 - 1.10 mg/dL Final     PMHx:   Past Medical History:  Diagnosis Date   Arthritis  Osteoarthritis right calcaneous   Carpal tunnel syndrome    Charcot ankle, right    Chronic kidney disease    patient unaware- does not see a nephrologist   Chronic pain    Complication of anesthesia    Depression    Diabetes mellitus    Tyoe II   Diabetic neuropathy (HCC)    Fibromyalgia    GERD (gastroesophageal reflux disease)    Headache    Hernia    History of blood transfusion    after miscarriage   Hyperlipemia    Hypertension    Miscarriage    twins   Peripheral neuropathy    PONV (postoperative nausea and vomiting)    no problem  with medication   TMJ syndrome     Past Surgical History:  Procedure Laterality Date   ABDOMINAL SURGERY     AMPUTATION Right 07/15/2017   Procedure: RIGHT BELOW KNEE AMPUTATION;  Surgeon: Nadara Mustard, MD;  Location: [facility] OR;  Service: Orthopedics;  Laterality: Right;   BUNIONECTOMY Bilateral    2 separate surgies   COLONOSCOPY     PARTIAL HYSTERECTOMY  1983   TMJ ARTHROPLASTY     left side x 2   TONSILLECTOMY     TUBAL LIGATION  1983   Umbilical hernia     x2   VAGINAL HYSTERECTOMY  2002    Family Hx:  Family History  Problem Relation Age of Onset   Aneurysm Father        Deceased, 94   CAD Father    Heart disease Father    Cancer Father    Pancreatic cancer Mother        Deceased, 39    Social History:  reports that she has been smoking cigarettes. She started smoking about 46 years ago. She has a 32 pack-year smoking history. She has never used smokeless tobacco. She reports that she does not drink alcohol and does not use drugs.  Allergies: No Known Allergies  Medications: Prior to Admission medications   Medication Sig Start Date End Date Taking? Authorizing Provider  Albuterol Sulfate 108 (90 Base) MCG/ACT AEPB Inhale 2 puffs into the lungs every 6 (six) hours as needed (for wheezing). 12/28/16  Yes Ahmed, Elyn Peers, MD  benazepril-hydrochlorthiazide (LOTENSIN HCT) 20-25 MG per tablet Take 1 tablet by mouth daily.   Yes [provider]  buprenorphine (SUBUTEX) 2 MG SUBL SL tablet Place 2 mg under the tongue 3 (three) times daily.   Yes [provider]  CALCIUM PO Take 1 tablet by mouth daily.   Yes [provider]  Cholecalciferol (VITAMIN D-3 PO) Take 1 tablet by mouth daily.   Yes [provider]  dapagliflozin propanediol (FARXIGA) 10 MG TABS tablet Take 10 mg by mouth daily.   Yes [provider]  DULoxetine (CYMBALTA) 60 MG capsule Take 120 mg by mouth daily.   Yes Karle Plumber, MD  escitalopram (LEXAPRO) 10 MG  tablet Take 10 mg by mouth daily.   Yes [provider]  ferrous sulfate 325 (65 FE) MG tablet Take 325 mg by mouth daily with breakfast.   Yes [provider]  levothyroxine (SYNTHROID, LEVOTHROID) 50 MCG tablet Take 50 mcg by mouth daily before breakfast.  08/15/15  Yes [provider]  metFORMIN (GLUCOPHAGE) 500 MG tablet Take 500 mg by mouth 2 (two) times daily. 03/24/18  Yes [provider]  nystatin (MYCOSTATIN/NYSTOP) powder Apply 1 Application topically 3 (three) times daily as needed (skin  irritation).   Yes [provider]  pregabalin (LYRICA) 200 MG capsule Take 200 mg by mouth 3 (three) times daily.   Yes Karle Plumber, MD  rosuvastatin (CRESTOR) 40 MG tablet Take 40 mg by mouth at bedtime.   Yes [provider]  Semaglutide, 2 MG/DOSE, (OZEMPIC, 2 MG/DOSE,) 8 MG/3ML SOPN Inject 2 mg into the skin every Friday.   Yes [provider]  SUMAtriptan (IMITREX) 100 MG tablet Take 100 mg by mouth every 2 (two) hours as needed for migraine.  08/15/15  Yes [provider]  azithromycin (ZITHROMAX) 250 MG tablet Take 250-500 mg by mouth See admin instructions. Take 2 tablets (500mg ) on day 1, then take 250mg  (1 tablet) daily on days 2-5. Patient not taking: Reported on 10/23/2023    [provider]  predniSONE (STERAPRED UNI-PAK 21 TAB) 10 MG (21) TBPK tablet Take 10-60 mg by mouth See admin instructions. Take 6 tablets (60mg ) on day 1, 5 tablets (50mg ) on day 2, 4 tablets (40mg ) on day 3, 3 tablets (30mg ) on day 4, 2 tablets (20mg ) on day 5, 1 tablet (10mg ) on day 6. Stop. Patient not taking: Reported on 10/23/2023    [provider]   I have reviewed the patient's current and reported prior to admission medications.  Labs:     Latest Ref Rng & Units 10/25/2023    2:51 AM 10/25/2023    2:40 AM 10/25/2023    1:52 AM  BMP  Glucose 70 - 99 mg/dL  161    BUN 8 - 23 mg/dL  81    Creatinine 0.96 - 1.00  mg/dL  0.45    Sodium 409 - 811 mmol/L 132  134  133   Potassium 3.5 - 5.1 mmol/L 5.7  5.8  6.4   Chloride 98 - 111 mmol/L  100    CO2 22 - 32 mmol/L  18    Calcium 8.9 - 10.3 mg/dL  7.3      Urinalysis    Component Value Date/Time   COLORURINE AMBER (A) 10/23/2023 1552   APPEARANCEUR HAZY (A) 10/23/2023 1552   LABSPEC 1.028 10/23/2023 1552   PHURINE 5.0 10/23/2023 1552   GLUCOSEU NEGATIVE 10/23/2023 1552   HGBUR NEGATIVE 10/23/2023 1552   BILIRUBINUR NEGATIVE 10/23/2023 1552   KETONESUR NEGATIVE 10/23/2023 1552   PROTEINUR 30 (A) 10/23/2023 1552   UROBILINOGEN 0.2 06/21/2012 0113   NITRITE NEGATIVE 10/23/2023 1552   LEUKOCYTESUR TRACE (A) 10/23/2023 1552     ROS: Unable to obtain secondary to altered mental status and obtunded   Physical Exam: Vitals:   10/25/23 0430 10/25/23 0445  BP:    Pulse: (!) 111 (!) 117  Resp: (!) 28 (!) 28  Temp:    SpO2: (!) 72% (!) 72%     General: adult female in bed critically ill, obese habitus  HEENT: NCAT Eyes: closed; does not track Neck: supple trachea midline  Heart: S1S2 no rub; tachycardia Lungs: coarse mechanical breath sounds; FIO2 100% and PEEP 12 Abdomen: soft/ obese habitus distended  Extremities: trace edema appreciated; no cyanosis or clubbing noted Skin: no rash on extremities exposed Neuro: sedation is currently running; she is not responsive to exam  Access left internal jugular nontunneled dialysis catheter   Assessment/Plan:  # Hyperkalemia - Improving with medical measures but in the setting of septic shock  - Start CRRT  - Stop lokelma - Stop bicarb gtt   # AKI  -Secondary to ischemic and prerenal insults in  the setting of nausea, vomiting, diarrhea, and septic shock.  Note also referral-HCTZ as Marcelline Deist - Starting CRRT.  2K dialysate for now and 4K pre and post filter fluids.  Follow trends and adjust as needed - Keep even as tolerated  - Remain off of benazepril-hydrochlorothiazide and farxiga and  would hold semaglutide as well.  Hold metformin (and would not resume metformin) - Stopping bicarb gtt   # Metabolic acidosis - Stop bicarb gtt - Start CRRT   # Septic shock  - note hx of HTN  - antibiotics per primary team  - Pressors per primary team   # Acute hypoxic respiratory failure  - intubated - vent per critical care  - optimize volume with CRRT as tolerated  # CKD stage 3a - Per 2021 and 2018 data had appear to be CKD stage 3b when Cr was mostly 1.7 - 2.0.  More recently in Care Everywhere, she had Cr 1.24-1.29 from 12/2022 and 01/2023 - Would discontinue metformin given her CKD - variable stage but now with AKI   Thank you for the consult.  Please do not hesitate to contact us with any questions regarding our patient  Estanislado Emms 10/25/2023, 6:14 AM

## 2023-10-25 NOTE — Progress Notes (Signed)
Informed of PM labs-- hyperK, persistent. CRRT orders changed to all 2K bags.  Anthony Sar, MD [facility]

## 2023-10-25 NOTE — Progress Notes (Signed)
Notified by RN: -Patient was tachycardic and requiring increasing pressors for hypotension: EKG showed Afib with RVR  -Repeat blood gas showed 7.08/ CO2 83.2/ O2 84  Plan: -Start Heparin drip and amiodarone for Afib with RVR -Increased RR to 30, started bicarb with D5 boluses for both acidemia and hypoglycemia

## 2023-10-25 NOTE — Progress Notes (Signed)
Pt transported to and from CT without respiratory event.

## 2023-10-25 NOTE — Progress Notes (Signed)
ABG obtained on ventilator settings of VT: 400, RR: 28 (changed by MD), PEEP: 12 and FIO2: 100%.  Results given to PA.  Increased RR back to 30.      Latest Reference Range & Units Most Recent  Sample type  ARTERIAL 10/25/23 12:02  pH, Arterial 7.35 - 7.45  7.082 (LL) 10/25/23 12:02  pCO2 arterial 32 - 48 mmHg 83.2 (HH) 10/25/23 12:02  pO2, Arterial 83 - 108 mmHg 84 10/25/23 12:02  TCO2 22 - 32 mmol/L 26 10/25/23 12:02  Acid-base deficit 0.0 - 2.0 mmol/L 7.0 (H) 10/25/23 12:02  Bicarbonate 20.0 - 28.0 mmol/L 24.0 10/25/23 12:02  O2 Saturation % 87 10/25/23 12:02  Patient temperature  39.3 C 10/25/23 12:02  Collection site  art line 10/25/23 12:02

## 2023-10-25 NOTE — Progress Notes (Signed)
eLink Physician-Brief Progress Note Patient Name: Erin [name] DOB: Oct 10, 1956 MRN: [id number]   Date of Service  10/25/2023  HPI/Events of Note  Severe acidemia.   7.08/81/69/26  eICU Interventions  Increase resp rate to 30 CRRT to start today Remains in shock Repeat abg in 2 hrs     Intervention Category Major Interventions: Acid-Base disturbance - evaluation and management  Henry Russel, P 10/25/2023, 6:53 AM

## 2023-10-25 NOTE — Progress Notes (Signed)
  0130- STAT CT scans ordered. Dahlia Byes NP stated patient too unstable at this time due to ABG and potassium.   0300- Dahlia Byes NP and Durel Salts MD discussed patient too unstable to go to CT. Patient needing to start CRRT.

## 2023-10-25 NOTE — Progress Notes (Signed)
Pharmacy Antibiotic Note  Erin [name] is a 67 y.o. female admitted on 10/23/2023 with sepsis.  Pharmacy has been consulted for Zosyn dosing.  Pt starting CRRT.  Plan: Change Zosyn to 3.375g IV Q6H (30-min infusion).  Height: 5\' 2"  (157.5 cm) Weight: 83.1 kg (183 lb 3.2 oz) IBW/kg (Calculated) : 50.1  Temp (24hrs), Avg:100.6 F (38.1 C), Min:98.2 F (36.8 C), Max:101.1 F (38.4 C)  Recent Labs  Lab 10/23/23 1535 10/23/23 1552 10/23/23 2128 10/24/23 0146 10/24/23 0534 10/24/23 1335 10/25/23 0100 10/25/23 0240 10/25/23 0300 10/25/23 0440 10/25/23 0603  WBC 16.8*  --  10.3  --  11.4*  --   --   --   --  14.9*  --   CREATININE  --    < > 3.44*  --  3.69* 3.45* 4.10* 4.07*  --   --   --   LATICACIDVEN  --    < > 3.2* 2.4*  --   --   --   --  3.2* 3.3* 2.9*   < > = values in this interval not displayed.    Estimated Creatinine Clearance: 13.8 mL/min (A) (by C-G formula based on SCr of 4.07 mg/dL (H)).    No Known Allergies   Thank you for allowing pharmacy to be a part of this patient's care.  Vernard Gambles, PharmD, BCPS  10/25/2023 6:54 AM

## 2023-10-25 NOTE — Procedures (Signed)
Central Venous Catheter Insertion Procedure Note  Erin [name]  [id number]  Feb 01, 1956  Date:10/25/23  Time:4:34 AM   Provider Performing:Phoenyx Melka [name]   Procedure: Insertion of Non-tunneled Central Venous Catheter(36556)with US guidance (40981)    Indication(s) Hemodialysis/CRRT  Consent Risks of the procedure as well as the alternatives and risks of each were explained to the patient and/or caregiver.  Consent was obtained verbally with spouse and children. the procedure was obtained and is signed in the bedside chart  Anesthesia Topical only with 1% lidocaine   Timeout Verified patient identification, verified procedure, site/side was marked, verified correct patient position, special equipment/implants available, medications/allergies/relevant history reviewed, required imaging and test results available.  Sterile Technique Maximal sterile technique including full sterile barrier drape, hand hygiene, sterile gown, sterile gloves, mask, hair covering, sterile ultrasound probe cover (if used).  Procedure Description Area of catheter insertion was cleaned with chlorhexidine and draped in sterile fashion.   With real-time ultrasound guidance a HD catheter was placed into the left internal jugular vein.  Nonpulsatile blood flow and easy flushing noted in all ports.  The catheter was sutured in place and sterile dressing applied.  Complications/Tolerance None; patient tolerated the procedure well. Chest X-ray is ordered to verify placement for internal jugular or subclavian cannulation.  Chest x-ray is not ordered for femoral cannulation.  EBL Minimal  Specimen(s) None

## 2023-10-25 NOTE — Progress Notes (Signed)
Initial Nutrition Assessment  DOCUMENTATION CODES:   Not applicable  INTERVENTION:  Once pt stable to feed, recommend the following: Initiate tube feeding via OGT: Osmolite 1.2 at 60 ml/h (1440 ml per day) Prosource TF20 60 ml 1x/d Provides 1808 kcal, 100 gm protein, 1181 ml free water daily When starting nutrition or Monitor magnesium and phosphorus every 12 hours x 4 occurrences, MD to replete as needed, as pt is at risk for refeeding syndrome given poor nutrition status at baseline. Thiamine 100mg  x 5 days when feeds started Renavite daily for micronutrient needs with CRRT   NUTRITION DIAGNOSIS:   Increased nutrient needs related to acute illness as evidenced by estimated needs.  GOAL:   Patient will meet greater than or equal to 90% of their needs  MONITOR:   I & O's, Vent status, Labs, Weight trends  REASON FOR ASSESSMENT:   Consult Assessment of nutrition requirement/status (CRRT)  ASSESSMENT:  Pt with hx of HTN, HLD, CKD, DM type 2, GERD, tobacco use, and hx of right BKA presented to [facility] ED with concerns for diarrhea. Found to be in septic shock.  2023-11-02 - presented to [facility] ED, transferred to [facility] ICU 12/10 - intubated, CRRT initiated   Patient is currently intubated on ventilator support. Husband and daughter at bedside provide a nutrition hx. State that at baseline, pt's intake is not adequate. Only meets 1 meal a day (dinner) and then will snack during the day (nabs, vienna sausages)  while husband is at work. He states she has coked several times in the past and he has had to help her clear her airway and so she does not eat meals while she is alone.   Husband notes pt started Ozempic ~6 months ago and pt lost 13 lbs. Usually wear a prosthetic on her right leg but it does not fit currently. Unsure of usual weight.  Discussed in rounds, pt not stable for enteral feeds today. On multiple pressors, blue toes noted. Once stable, recommend enteral feeds and  pt will likely be at risk for refeeding due to poor nutrition at baseline. Suspect that pt has malnutrition, but edema is present unable to definitively dx.   MV: 11.2 L/min Temp (24hrs), Avg:100.7 F (38.2 C), Min:98.2 F (36.8 C), Max:103.8 F (39.9 C)  Admit weight: 99.8 kg ? accuracy Current weight: 83.1 kg No recent weight hx available.    Intake/Output Summary (Last 24 hours) at 10/25/2023 1423 Last data filed at 10/25/2023 1400 Gross per 24 hour  Intake 2694.15 ml  Output 259.1 ml  Net 2435.05 ml  Net IO Since Admission: 7,296.74 mL [10/25/23 1423]  Drains/Lines: OG tube Temporary HD catheter triple lumen  Nutritionally Relevant Medications: Scheduled Meds:  famotidine  20 mg BID   hydrocortisone sod succinate  100 mg Q12H   insulin aspart  0-15 Units Q4H   sodium bicarbonate      sodium zirconium cyclosilicate  10 g Once   Continuous Infusions:  calcium gluconate     norepinephrine (LEVOPHED)  25 mcg/min (10/25/23 0700)   piperacillin-tazobactam     vasopressin 0.03 Units/min (10/25/23 0700)   PRN Meds: docusate sodium, ondansetron, polyethylene glycol  Labs Reviewed: K 7.0 BUN 81, creatinine 4.07 Phosphorus 8.6 Mg 2.5 CBG ranges from 37-134 mg/dL over the last 24 hours HgbA1c 5.6%  Lab Results  Component Value Date   ALT 328 (H) 10/25/2023   AST 647 (H) 10/25/2023   ALKPHOS 151 (H) 10/25/2023   BILITOT 1.1 10/25/2023  NUTRITION - FOCUSED PHYSICAL EXAM: Flowsheet Row Most Recent Value  Orbital Region Mild depletion  Upper Arm Region No depletion  Thoracic and Lumbar Region No depletion  Buccal Region No depletion  Temple Region Mild depletion  Clavicle Bone Region No depletion  Clavicle and Acromion Bone Region No depletion  Scapular Bone Region Mild depletion  Dorsal Hand Mild depletion  Patellar Region Mild depletion  Anterior Thigh Region Mild depletion  Posterior Calf Region Mild depletion  Edema (RD Assessment) Moderate  Hair  Reviewed  Eyes Reviewed  Mouth Reviewed  [tongue appears dry and discolored (dark, blood?)]  Skin Reviewed  [bruising, toes blue]  Nails Reviewed  [picked and thin - family states pt picks constantly]    Diet Order:   Diet Order             Diet NPO time specified  Diet effective now                   EDUCATION NEEDS:  Not appropriate for education at this time  Skin:  Skin Assessment: Reviewed RN Assessment Ulcer - tibia (1.5 x 1 cm)  Last BM:  25-Oct-2023 - type 5  Height:  Ht Readings from Last 1 Encounters:  10-25-23 5\' 2"  (1.575 m)    Weight:  Wt Readings from Last 1 Encounters:  10/24/23 83.1 kg    Ideal Body Weight:  47.1 kg (adjusted by 5.9% for BKA)  BMI:  Body mass index is 35.6 kg/m. (Adjusted for amputation using 12/9 wt)  Estimated Nutritional Needs:  Kcal:  1600-1800 kcal/d Protein:  90-105g/d Fluid:  1.8L/d    Greig Castilla, RD, LDN Registered Dietitian II RD pager # available in [facility]  After hours/weekend pager # available in [facility]

## 2023-10-25 NOTE — Progress Notes (Signed)
Patient transported to CT and back to room 2M10, on ventilator, without complications.

## 2023-10-25 NOTE — TOC CM/SW Note (Signed)
Transition of Care [facility]) - Inpatient Brief Assessment   Patient Details  Name: Erin [name] MRN: [id number] Date of Birth: 18-Oct-1956  Transition of Care [facility]) CM/SW Contact:    Tom-Johnson, Hershal Coria, RN Phone Number: 10/25/2023, 3:16 PM   Clinical Narrative:  Patient presented to [facility] ED with Generalized Weakness, N/V/Diarrhea. Found to have elevated Potassium associated Metabolic Acidosis, Lactic Acidosis, elevated LFTs and Leukocytosis. Patient was started on IV abx, Pressors, IVF and transferred to [facility] ICU for further management and care. Admitted with Septic Shock.  Patient is currently Intubated and Sedated d/t Hypoxia, Confusion and Hyperkalemia. Nephrology consulted for potential CRRT.   Patient not Medically ready for discharge.  CM will continue to follow as patient progresses with care towards discharge.             Transition of Care Asessment:

## 2023-10-25 NOTE — Procedures (Signed)
I saw and evaluated the patient on CRRT.  I reviewed the last 24 hours events.  Adjustments to CRRT prescription are made as needed.  Starting CRRT w/ 2K baths  Filed Weights   10/23/23 1529 10/24/23 0500  Weight: 99.8 kg 83.1 kg    Recent Labs  Lab 10/25/23 0240 10/25/23 0251 10/25/23 0943  NA 134*   < > 135  K 5.8*   < > 7.0*  CL 100  --   --   CO2 18*  --   --   GLUCOSE 168*  --   --   BUN 81*  --   --   CREATININE 4.07*  --   --   CALCIUM 7.3*  --   --   PHOS 8.6*  --   --    < > = values in this interval not displayed.    Recent Labs  Lab 10/23/23 1535 10/23/23 1620 10/23/23 2128 10/24/23 0534 10/24/23 2203 10/25/23 0440 10/25/23 0614 10/25/23 0853 10/25/23 0943  WBC 16.8*  --  10.3 11.4*  --  14.9*  --   --   --   NEUTROABS 13.8*  --  8.4*  --   --   --   --   --   --   HGB 16.3*   < > 14.5 14.0   < > 14.5 15.3* 14.3 15.0  HCT 50.9*   < > 45.3 42.9   < > 45.2 45.0 42.0 44.0  MCV 97.7  --  96.4 94.9  --  95.8  --   --   --   PLT 369  --  291 198  --  171  --   --   --    < > = values in this interval not displayed.    Scheduled Meds:  Chlorhexidine Gluconate Cloth  6 each Topical Daily   famotidine  10 mg Per Tube Daily   heparin  5,000 Units Subcutaneous Q8H   hydrocortisone sod succinate (SOLU-CORTEF) inj  100 mg Intravenous Q12H   insulin aspart  0-9 Units Subcutaneous TID WC   nicotine  7 mg Transdermal Daily   Continuous Infusions:   prismasol BGK 4/2.5 400 mL/hr at 10/25/23 1047    prismasol BGK 4/2.5 400 mL/hr at 10/25/23 1047   fentaNYL infusion INTRAVENOUS 100 mcg/hr (10/25/23 1100)   norepinephrine (LEVOPHED) Adult infusion 33 mcg/min (10/25/23 1100)   piperacillin-tazobactam 3.375 g (10/25/23 1134)   prismasol BGK 2/2.5 dialysis solution 2,000 mL/hr at 10/25/23 1047   vasopressin 0.03 Units/min (10/25/23 1100)   PRN Meds:.acetaminophen, acetaminophen, docusate sodium, fentaNYL, heparin, midazolam, ondansetron (ZOFRAN) IV, mouth rinse,  polyethylene glycol   Louie Bun,  MD 10/25/2023, 11:38 AM

## 2023-10-25 NOTE — Progress Notes (Signed)
eLink Physician-Brief Progress Note Patient Name: Erin [name] DOB: 10-May-1956 MRN: [id number]   Date of Service  10/25/2023  HPI/Events of Note  Family requested to talk to me, they comprised a large group with the husband as spokesperson, he indicated the family's decision to compassionately extubate the patient and transition to end of life / comfort care, I clarified their wishes to include terminal extubation, then provided an overview of the process, there were no questions afterward, family expressed appreciation for car she received while hospitalized.  eICU Interventions  Orders entered for compassionate (terminal) extubation and comfort / end of life care.        Thomasene Lot [name] 10/25/2023, 11:15 PM

## 2023-10-25 NOTE — Progress Notes (Signed)
Patient became bradycardic into the 30s, calcium and bicarbonate was administered.   Patient's family is at bedside. We discussed the code status. Husband would like to change patient to DNR pre-arrest intervention, may intubate. Patient's family understood the alternative (chest compressions and CPR to resuscitate the patient) and agreed to NOT pursue CPR or chest compressions.     Faith Rogue, DO Internal Medicine Resident: PGY-1

## 2023-10-25 NOTE — Progress Notes (Signed)
PHARMACY - ANTICOAGULATION CONSULT NOTE  Pharmacy Consult for heparin  Indication: atrial fibrillation  No Known Allergies  Patient Measurements: Height: 5\' 2"  (157.5 cm) Weight: 83.1 kg (183 lb 3.2 oz) IBW/kg (Calculated) : 50.1 Heparin Dosing Weight: 73.8kg   Vital Signs: Temp: 103.8 F (39.9 C) (12/10 0939) Temp Source: Oral (12/10 0732) BP: 118/33 (12/10 1144) Pulse Rate: 89 (12/10 1144)  Labs: Recent Labs    10/24/2023 1552 10-24-2023 1620 24-Oct-2023 2128 10/24/23 0534 10/24/23 1335 10/24/23 2203 10/25/23 0100 10/25/23 0152 10/25/23 0240 10/25/23 0251 10/25/23 0440 10/25/23 0614 10/25/23 0853 10/25/23 0943 10/25/23 1202  HGB  --    < > 14.5 14.0  --    < >  --    < >  --    < > 14.5   < > 14.3 15.0 14.3  HCT  --    < > 45.3 42.9  --    < >  --    < >  --    < > 45.2   < > 42.0 44.0 42.0  PLT  --   --  291 198  --   --   --   --   --   --  171  --   --   --   --   APTT 24  --   --   --   --   --   --   --   --   --   --   --   --   --   --   LABPROT 15.6*  --  16.7*  --   --   --   --   --   --   --   --   --   --   --   --   INR 1.2  --  1.3*  --   --   --   --   --   --   --   --   --   --   --   --   CREATININE  --    < > 3.44* 3.69* 3.45*  --  4.10*  --  4.07*  --   --   --   --   --   --    < > = values in this interval not displayed.    Estimated Creatinine Clearance: 13.8 mL/min (A) (by C-G formula based on SCr of 4.07 mg/dL (H)).   Medical History: Past Medical History:  Diagnosis Date   Arthritis    Osteoarthritis right calcaneous   Carpal tunnel syndrome    Charcot ankle, right    Chronic kidney disease    patient unaware- does not see a nephrologist   Chronic pain    Complication of anesthesia    Depression    Diabetes mellitus    Tyoe II   Diabetic neuropathy (HCC)    Fibromyalgia    GERD (gastroesophageal reflux disease)    Headache    Hernia    History of blood transfusion    after miscarriage   Hyperlipemia    Hypertension     Miscarriage    twins   Peripheral neuropathy    PONV (postoperative nausea and vomiting)    no problem with medication   TMJ syndrome    Assessment: Patient admitted with septic shock suspected secondary to an intra-abdominal source. Patient requiring high dose vasopressors subsequently developing Afib. Pharmacy consulted to start heparin gtt. HgB 14.3 and  plts 171.   Patient not on anticoagulation PTA. Received heparin subcutaneous at 05:00   Goal of Therapy:  Heparin level 0.3-0.7 units/ml Monitor platelets by anticoagulation protocol: Yes   Plan:  Will not given bolus given recent heparin subcutaneous and Afib indication.  Start heparin infusion at 1100 units/hr Check anti-Xa level in 8 hours and daily while on heparin Continue to monitor H&H and platelets  Estill Batten, PharmD, BCCCP  10/25/2023,12:34 PM

## 2023-10-25 NOTE — Progress Notes (Signed)
eLink Physician-Brief Progress Note Patient Name: Erin [name] DOB: [birth year] MRN: [id number]   Date of Service  10/25/2023  HPI/Events of Note  [name] has septic shock, acute renal failure, acidemia, and increasing oxygen requirements.  She is becoming more restless.  eICU Interventions  Stop LR Begin bicarb drip Cxr stat Evaluation by ground team for intubation Will likely need renal replacement therapy     Intervention Category Major Interventions: Hypoxemia - evaluation and management;Shock - evaluation and management  Henry Russel, P 10/25/2023, 12:39 AM

## 2023-10-25 NOTE — Progress Notes (Addendum)
Brief nephrology note  Started crrt at 75. Labs at noon with k of 6.5. Hasn't been on crrt long enough to expect a big change. Will continue with current efforts and may adjust DFR based on response this afternoon. Improving her acidemia will also help a lot.  Asked about further bicarb. Her acidemia is largely driven by her respiratory acidosis although lactic acidemia is also contributing. IV bicarb in addition to CRRT can be considered but of only moderate efficacy in this situation. She will get a lot of bicarb with CRRT. Ventilator mgmt is key. Defer to ccm but consider paralytics if needed. Primary team also considering further eval for colonic ischemia but her overall instability is limiting this.  Patient is now DNR.

## 2023-10-25 NOTE — Progress Notes (Signed)
Unable to obtain a sputum sample at this time 

## 2023-10-25 NOTE — Significant Event (Cosign Needed)
Significant Event  Called to evaluate patient due to ongoing hypoxia, confusion, hyperkalemia, and need for emergent intubation along with potential CRRT.  Upon arrival patient was on 100% NRB mask and 100% Hiflow Blunt, tachypnic, lethargic, tremulous. Labs reviewed: ABG 7.23/29/12.6/66, K 6.2. VS: Afebrile, Tachy 110-120, on levophed infusion MAP 65-70, 87-88%. Abdomen distended, cool extremities, patient appears to be in profound shock. Dr. Celine Mans presented bedside, patient intubated (see separate note). Insulin/dextrose/calcium/bicarb ordered, will give lasix cautiously with renal failure and pressor requirements, Lokelma Q 8 hours x 3, and repeat all labs. STAT CT A/P/T pending once she is stable to travel, hopefully in the next 1-2 hours. Will sedate on fentanyl. Continue antibiotics and start stress dose steroids. I called patients husband Erin [name] and updated him on the severity of her illness, he is calling his children and will be at the hospital soon. All questions answered.

## 2023-10-25 NOTE — Procedures (Signed)
Intubation Procedure Note  Erin [name]  [id number]  [birth year]  Date:10/25/23  Time:1:45 AM   Provider Performing:Erin [name]    Procedure: Intubation (31500)  Indication(s) Respiratory Failure  Consent Unable to obtain consent due to emergent nature of procedure.   Anesthesia Etomidate, fentanyl, rocuronium   Time Out Verified patient identification, verified procedure, site/side was marked, verified correct patient position, special equipment/implants available, medications/allergies/relevant history reviewed, required imaging and test results available.   Sterile Technique Usual hand hygeine, masks, and gloves were used   Procedure Description Patient positioned in bed supine.  Sedation given as noted above.  Patient was intubated with endotracheal tube using Glidescope.  View was Grade 1 full glottis .  Number of attempts was 1.  Colorimetric CO2 detector was consistent with tracheal placement.   Complications/Tolerance None; patient tolerated the procedure well. Chest X-ray is ordered to verify placement. Withdrew ETT 2 cm Patient with emesis noted as tube advancing. Cuff inflated   EBL None   Specimen(s) None

## 2023-10-25 NOTE — Progress Notes (Signed)
ETT pulled back by 2cm per MD order. ETT secured

## 2023-10-25 NOTE — Progress Notes (Addendum)
Critical ABG results given to E-link RN. 

## 2023-10-26 DIAGNOSIS — I4891 Unspecified atrial fibrillation: Secondary | ICD-10-CM | POA: Insufficient documentation

## 2023-10-26 DIAGNOSIS — E8729 Other acidosis: Secondary | ICD-10-CM | POA: Insufficient documentation

## 2023-10-28 LAB — CULTURE, BLOOD (ROUTINE X 2)
Culture: NO GROWTH
Culture: NO GROWTH
Special Requests: ADEQUATE
Special Requests: ADEQUATE

## 2023-10-29 LAB — CULTURE, RESPIRATORY W GRAM STAIN: Gram Stain: NONE SEEN

## 2023-11-16 NOTE — Procedures (Signed)
Extubation Procedure Note  Patient Details:   Name: Erin [name] DOB: Jan 07, 1956 MRN: [id number]   Airway Documentation:    Vent end date: 12-Nov-2023 Vent end time: 0001   Pt extubated to comforcare [name] E Anastasia Fiedler 11/12/2023, 1:17 AM

## 2023-11-16 NOTE — Death Summary Note (Signed)
PCCM Death Summary  Name: Erin [name] MRN: [id number] DOB: 1956/08/20 68 y.o.  Date of Admission: 11-15-2023  3:17 PM Date of Discharge: November 18, 2023 Attending Physician: No att. providers found  Discharge Diagnosis: Principal Problem:   Septic shock (HCC) Active Problems:   Elevated LFTs   Metabolic acidosis, increased anion gap   Lactic acidosis   Encephalopathy acute   Acute renal failure (HCC)   Respiratory acidosis   Atrial fibrillation with RVR (HCC)   Cause of death: Acidemia due to Septic Shock  Time of death: 12-05-23, 2023/11/18  Disposition and follow-up:   [name] was discharged from [facility] in expired condition.    Hospital Course: Septic shock from unclear source, possible bowel obstruction, possible ischemic colitis. Acute hypoxic and hypercarbic respiratory failure Elevated LFTs Metabolic acidosis, increased anion gap Lactic acidosis Encephalopathy acute Acute renal failure  Patient presented to the emergency department on November 15, 2023 for evaluation of diarrhea that was preceded by one day of nausea, vomiting, and abdominal pain. Initial laboratory work was significant for lactic acidosis, elevated LFTs, and leukocytosis and was started on rocephin and levophed.  She was admitted to the ICU on Nov 15, 2023 for further management. On 12/10, patient was intubated for hypoxia and confusion. Patient was found to be hyperkalemic and nephrology was consulted to start emergent CRRT. While intubated, her acidemia continued to worsen. A stat CT of chest/abdomen/pelvis was performed which showed possible ileus vs obstruction; unfortunately, due to patient not being on CRRT at that time with her severe AKI, contrast was unable to be administered. GI panel was positive for EPEC. Patient was started on CRRT after the scan. She then acutely decompensated requiring increasing pressors and she was found to have Afib with RVR due to her septic shock. An  amiodarone bolus and drip with heparin drip was started. Shortly after the amiodarone was started, she became bradycardic with rates in the 30s. Numerous bicarbonate amps and calcium gluconate doses were administered throughout the afternoon as stabilizing measures. Husband decided to change code status to DNR, may intubate. Her acidosis progressively worsened with an increase in lactic acid. Patient's family elected to transition to comfort care with a compassionate extubation. On November 18, 2023 at 0028, the patient died.     SignedFaith Rogue, DO 11-18-2023, 12:37 PM

## 2023-11-16 NOTE — Progress Notes (Signed)
Patient went asystole on the monitor. This RN and Christene Lye RN pronounced time of death at [facility].

## 2023-11-16 DEATH — deceased
